# Patient Record
Sex: Female | Born: 1935
Health system: Southern US, Community
[De-identification: ages and names within clinical notes are randomized; demographics above are authoritative.]

## PROBLEM LIST (undated history)

## (undated) DIAGNOSIS — R9430 Abnormal result of cardiovascular function study, unspecified: Secondary | ICD-10-CM

## (undated) DIAGNOSIS — Z9889 Other specified postprocedural states: Secondary | ICD-10-CM

## (undated) DIAGNOSIS — R9431 Abnormal electrocardiogram [ECG] [EKG]: Secondary | ICD-10-CM

## (undated) DIAGNOSIS — K219 Gastro-esophageal reflux disease without esophagitis: Secondary | ICD-10-CM

## (undated) DIAGNOSIS — Z79899 Other long term (current) drug therapy: Secondary | ICD-10-CM

## (undated) DIAGNOSIS — I358 Other nonrheumatic aortic valve disorders: Secondary | ICD-10-CM

## (undated) DIAGNOSIS — I6529 Occlusion and stenosis of unspecified carotid artery: Secondary | ICD-10-CM

## (undated) DIAGNOSIS — R0600 Dyspnea, unspecified: Secondary | ICD-10-CM

## (undated) DIAGNOSIS — F419 Anxiety disorder, unspecified: Secondary | ICD-10-CM

## (undated) DIAGNOSIS — M1990 Unspecified osteoarthritis, unspecified site: Secondary | ICD-10-CM

## (undated) DIAGNOSIS — E785 Hyperlipidemia, unspecified: Secondary | ICD-10-CM

## (undated) DIAGNOSIS — I4891 Unspecified atrial fibrillation: Secondary | ICD-10-CM

## (undated) DIAGNOSIS — M549 Dorsalgia, unspecified: Secondary | ICD-10-CM

## (undated) DIAGNOSIS — I10 Essential (primary) hypertension: Secondary | ICD-10-CM

## (undated) DIAGNOSIS — R0602 Shortness of breath: Secondary | ICD-10-CM

## (undated) DIAGNOSIS — IMO0002 Reserved for concepts with insufficient information to code with codable children: Secondary | ICD-10-CM

## (undated) DIAGNOSIS — H539 Unspecified visual disturbance: Secondary | ICD-10-CM

## (undated) HISTORY — PX: BACK SURGERY: SHX140

## (undated) HISTORY — DX: Unspecified atrial fibrillation: I48.91

## (undated) HISTORY — DX: Gastro-esophageal reflux disease without esophagitis: K21.9

## (undated) HISTORY — DX: Hyperlipidemia, unspecified: E78.5

## (undated) HISTORY — DX: Other specified postprocedural states: Z98.890

## (undated) HISTORY — DX: Anxiety disorder, unspecified: F41.9

## (undated) HISTORY — DX: Abnormal electrocardiogram (ECG) (EKG): R94.31

## (undated) HISTORY — DX: Reserved for concepts with insufficient information to code with codable children: IMO0002

## (undated) HISTORY — DX: Occlusion and stenosis of unspecified carotid artery: I65.29

## (undated) HISTORY — DX: Dyspnea, unspecified: R06.00

## (undated) HISTORY — DX: Essential (primary) hypertension: I10

## (undated) HISTORY — DX: Unspecified visual disturbance: H53.9

## (undated) HISTORY — PX: CATARACT EXTRACTION, BILATERAL: SHX1313

## (undated) HISTORY — DX: Dorsalgia, unspecified: M54.9

## (undated) HISTORY — DX: Other nonrheumatic aortic valve disorders: I35.8

## (undated) HISTORY — PX: SKIN BIOPSY: SHX1

## (undated) HISTORY — DX: Abnormal result of cardiovascular function study, unspecified: R94.30

## (undated) HISTORY — DX: Shortness of breath: R06.02

## (undated) HISTORY — DX: Other long term (current) drug therapy: Z79.899

---

## 2000-10-06 ENCOUNTER — Encounter: Payer: Self-pay | Admitting: Neurosurgery

## 2000-10-06 ENCOUNTER — Encounter: Admission: RE | Admit: 2000-10-06 | Discharge: 2000-10-06 | Payer: Self-pay | Admitting: Neurosurgery

## 2001-06-29 ENCOUNTER — Encounter: Payer: Self-pay | Admitting: Neurosurgery

## 2001-07-01 ENCOUNTER — Encounter: Payer: Self-pay | Admitting: Neurosurgery

## 2001-07-01 ENCOUNTER — Inpatient Hospital Stay (HOSPITAL_COMMUNITY): Admission: RE | Admit: 2001-07-01 | Discharge: 2001-07-05 | Payer: Self-pay | Admitting: Neurosurgery

## 2004-08-08 ENCOUNTER — Ambulatory Visit (HOSPITAL_COMMUNITY): Admission: RE | Admit: 2004-08-08 | Discharge: 2004-08-08 | Payer: Self-pay | Admitting: General Surgery

## 2004-09-12 ENCOUNTER — Encounter: Admission: RE | Admit: 2004-09-12 | Discharge: 2004-09-12 | Payer: Self-pay | Admitting: General Surgery

## 2005-08-28 ENCOUNTER — Ambulatory Visit (HOSPITAL_COMMUNITY): Admission: RE | Admit: 2005-08-28 | Discharge: 2005-08-28 | Payer: Self-pay | Admitting: General Surgery

## 2008-01-12 ENCOUNTER — Ambulatory Visit: Payer: Self-pay | Admitting: Cardiology

## 2008-01-13 ENCOUNTER — Inpatient Hospital Stay (HOSPITAL_COMMUNITY): Admission: AD | Admit: 2008-01-13 | Discharge: 2008-01-14 | Payer: Self-pay | Admitting: Internal Medicine

## 2008-01-13 ENCOUNTER — Ambulatory Visit: Payer: Self-pay | Admitting: Cardiology

## 2008-01-14 ENCOUNTER — Encounter: Payer: Self-pay | Admitting: Cardiology

## 2008-01-14 ENCOUNTER — Ambulatory Visit: Payer: Self-pay | Admitting: Vascular Surgery

## 2008-04-22 ENCOUNTER — Inpatient Hospital Stay (HOSPITAL_COMMUNITY): Admission: RE | Admit: 2008-04-22 | Discharge: 2008-04-23 | Payer: Self-pay | Admitting: Neurosurgery

## 2008-09-06 ENCOUNTER — Encounter: Payer: Self-pay | Admitting: Cardiology

## 2008-11-07 ENCOUNTER — Encounter: Payer: Self-pay | Admitting: Cardiology

## 2008-11-07 ENCOUNTER — Ambulatory Visit: Payer: Self-pay | Admitting: Cardiology

## 2008-11-08 ENCOUNTER — Encounter: Payer: Self-pay | Admitting: Cardiology

## 2008-12-01 ENCOUNTER — Ambulatory Visit: Payer: Self-pay | Admitting: Cardiology

## 2008-12-02 ENCOUNTER — Encounter: Payer: Self-pay | Admitting: Cardiology

## 2009-01-10 ENCOUNTER — Encounter: Payer: Self-pay | Admitting: Cardiology

## 2009-02-15 ENCOUNTER — Ambulatory Visit: Payer: Self-pay | Admitting: Cardiology

## 2009-02-28 ENCOUNTER — Ambulatory Visit: Payer: Self-pay | Admitting: Cardiology

## 2009-02-28 ENCOUNTER — Encounter: Payer: Self-pay | Admitting: Cardiology

## 2009-03-06 ENCOUNTER — Telehealth: Payer: Self-pay | Admitting: Cardiology

## 2009-04-05 ENCOUNTER — Encounter (INDEPENDENT_AMBULATORY_CARE_PROVIDER_SITE_OTHER): Payer: Self-pay | Admitting: *Deleted

## 2009-04-05 ENCOUNTER — Ambulatory Visit: Payer: Self-pay | Admitting: Cardiology

## 2009-04-05 DIAGNOSIS — R0602 Shortness of breath: Secondary | ICD-10-CM | POA: Insufficient documentation

## 2009-05-05 ENCOUNTER — Telehealth (INDEPENDENT_AMBULATORY_CARE_PROVIDER_SITE_OTHER): Payer: Self-pay | Admitting: *Deleted

## 2009-05-11 ENCOUNTER — Ambulatory Visit: Payer: Self-pay | Admitting: Internal Medicine

## 2009-05-11 DIAGNOSIS — H5310 Unspecified subjective visual disturbances: Secondary | ICD-10-CM | POA: Insufficient documentation

## 2009-05-11 DIAGNOSIS — R079 Chest pain, unspecified: Secondary | ICD-10-CM | POA: Insufficient documentation

## 2009-05-15 ENCOUNTER — Encounter: Payer: Self-pay | Admitting: Internal Medicine

## 2009-05-15 ENCOUNTER — Ambulatory Visit: Payer: Self-pay | Admitting: Cardiology

## 2009-05-15 ENCOUNTER — Encounter: Payer: Self-pay | Admitting: Cardiology

## 2009-05-16 ENCOUNTER — Encounter: Payer: Self-pay | Admitting: Cardiology

## 2009-05-16 ENCOUNTER — Telehealth: Payer: Self-pay | Admitting: Internal Medicine

## 2009-05-22 ENCOUNTER — Telehealth (INDEPENDENT_AMBULATORY_CARE_PROVIDER_SITE_OTHER): Payer: Self-pay | Admitting: *Deleted

## 2009-05-23 ENCOUNTER — Telehealth: Payer: Self-pay | Admitting: Internal Medicine

## 2009-06-08 ENCOUNTER — Ambulatory Visit: Payer: Self-pay | Admitting: Cardiology

## 2009-06-09 ENCOUNTER — Telehealth (INDEPENDENT_AMBULATORY_CARE_PROVIDER_SITE_OTHER): Payer: Self-pay | Admitting: *Deleted

## 2009-06-13 ENCOUNTER — Encounter: Payer: Self-pay | Admitting: Cardiology

## 2009-06-13 DIAGNOSIS — K219 Gastro-esophageal reflux disease without esophagitis: Secondary | ICD-10-CM | POA: Insufficient documentation

## 2009-06-14 ENCOUNTER — Ambulatory Visit: Payer: Self-pay | Admitting: Cardiology

## 2009-06-22 ENCOUNTER — Encounter: Payer: Self-pay | Admitting: Cardiology

## 2009-06-28 ENCOUNTER — Encounter: Payer: Self-pay | Admitting: Cardiology

## 2009-07-25 ENCOUNTER — Encounter: Payer: Self-pay | Admitting: Internal Medicine

## 2009-07-27 ENCOUNTER — Ambulatory Visit: Payer: Self-pay | Admitting: Cardiology

## 2009-08-14 ENCOUNTER — Encounter: Payer: Self-pay | Admitting: Cardiology

## 2009-08-31 ENCOUNTER — Encounter: Payer: Self-pay | Admitting: Physician Assistant

## 2009-08-31 ENCOUNTER — Ambulatory Visit: Payer: Self-pay | Admitting: Cardiology

## 2009-09-07 ENCOUNTER — Encounter: Payer: Self-pay | Admitting: Cardiology

## 2009-09-14 ENCOUNTER — Ambulatory Visit: Payer: Self-pay | Admitting: Cardiology

## 2009-09-15 ENCOUNTER — Telehealth (INDEPENDENT_AMBULATORY_CARE_PROVIDER_SITE_OTHER): Payer: Self-pay | Admitting: *Deleted

## 2009-09-19 ENCOUNTER — Encounter: Payer: Self-pay | Admitting: Physician Assistant

## 2009-09-28 ENCOUNTER — Telehealth (INDEPENDENT_AMBULATORY_CARE_PROVIDER_SITE_OTHER): Payer: Self-pay | Admitting: *Deleted

## 2009-10-05 ENCOUNTER — Encounter: Payer: Self-pay | Admitting: Cardiology

## 2009-10-06 ENCOUNTER — Ambulatory Visit: Payer: Self-pay | Admitting: Cardiology

## 2009-10-13 ENCOUNTER — Encounter: Payer: Self-pay | Admitting: Cardiology

## 2009-11-01 ENCOUNTER — Telehealth (INDEPENDENT_AMBULATORY_CARE_PROVIDER_SITE_OTHER): Payer: Self-pay | Admitting: *Deleted

## 2009-11-13 ENCOUNTER — Ambulatory Visit: Payer: Self-pay | Admitting: Cardiology

## 2009-11-13 DIAGNOSIS — G4700 Insomnia, unspecified: Secondary | ICD-10-CM | POA: Insufficient documentation

## 2010-01-29 ENCOUNTER — Ambulatory Visit: Payer: Self-pay | Admitting: Cardiology

## 2010-01-30 ENCOUNTER — Encounter: Payer: Self-pay | Admitting: Cardiology

## 2010-03-29 ENCOUNTER — Telehealth (INDEPENDENT_AMBULATORY_CARE_PROVIDER_SITE_OTHER): Payer: Self-pay | Admitting: *Deleted

## 2010-05-02 ENCOUNTER — Encounter: Payer: Self-pay | Admitting: Cardiology

## 2010-05-03 ENCOUNTER — Ambulatory Visit: Payer: Self-pay | Admitting: Cardiology

## 2010-08-03 ENCOUNTER — Telehealth: Payer: Self-pay | Admitting: Cardiology

## 2010-09-25 NOTE — Miscellaneous (Signed)
  Clinical Lists Changes  Observations: Added new observation of PAST MED HX: CAROTID ARTERY STENOSIS (ICD-433.10)..Dopplers 2009 better than past..  /  doppler.Marland KitchenMarland Kitchen1/13/2011...less than 50% bilateral..stable HYPERLIPIDEMIA-MIXED (ICD-272.4) HYPERTENSION, UNSPECIFIED (ICD-401.9) DYSPNEA (ICD-786.05) ATRIAL FIBRILLATION (ICD-427.31)..cardioversion prior to April 05, 2009.. but reverted atrial fibrillation   / consultation Dr.Klein...decision to use Propafenone and cardiovert again..cardioversion June 08, 2009.[name] on 4 days Propafenone... 100 Joules... normal sinus rhythm... Cardizem stopped.. /  return to atrial fib.[name]   /   cardionet on cardizem...09/14/2009...rates  52-190. GERD Chest pain.... cardiac catheterization.. 2009... no significant CAD.[name] / recurrent chest pain 2010.. consider GI... aspirin stopped EF..  65%... echo.Marland KitchenMarland KitchenSeptember, 2010 Aortic valve sclerosis   no stenosis.... echo... September, 2010 Anxiety.. may play a role with some symptoms Coumadin therapy Low-voltage EKG.... no evidence amyloid by echo Visual changes.. prior planned to have ophthalmology evaluation   (05/02/2010 14:26) Added new observation of REFERRING MD: Jerral Bonito (05/02/2010 14:26) Added new observation of PRIMARY MD: Selinda Flavin, MD (05/02/2010 14:26)       Past History:  Past Medical History: CAROTID ARTERY STENOSIS (ICD-433.10)..Dopplers 2009 better than past..  /  doppler.Marland KitchenMarland Kitchen1/13/2011...less than 50% bilateral..stable HYPERLIPIDEMIA-MIXED (ICD-272.4) HYPERTENSION, UNSPECIFIED (ICD-401.9) DYSPNEA (ICD-786.05) ATRIAL FIBRILLATION (ICD-427.31)..cardioversion prior to April 05, 2009.. but reverted atrial fibrillation   / consultation Dr.Klein...decision to use Propafenone and cardiovert again..cardioversion June 08, 2009.[name] on 4 days Propafenone... 100 Joules... normal sinus rhythm... Cardizem stopped.. /  return to atrial fib.[name]   /   cardionet on cardizem...09/14/2009...rates  52-190. GERD Chest  pain.... cardiac catheterization.. 2009... no significant CAD.[name] / recurrent chest pain 2010.. consider GI... aspirin stopped EF..  65%... echo.Marland KitchenMarland KitchenSeptember, 2010 Aortic valve sclerosis   no stenosis.... echo... September, 2010 Anxiety.. may play a role with some symptoms Coumadin therapy Low-voltage EKG.... no evidence amyloid by echo Visual changes.. prior planned to have ophthalmology evaluation

## 2010-09-25 NOTE — Procedures (Signed)
Summary: 48 Hr Holter  48 Hr Holter   Imported By: Cyril Loosen, RN, BSN 09/26/2009 11:00:59  _____________________________________________________________________  External Attachment:    Type:   Image     Comment:   External Document  Appended Document: 48 Hr Holter Pt notified of results and verbalized understanding.

## 2010-09-25 NOTE — Letter (Signed)
Summary: External Correspondence/OFFICE NOTE DR.HOWARD  External Correspondence/OFFICE NOTE DR.HOWARD   Imported By: Dorise Hiss 05/19/2009 11:46:43  _____________________________________________________________________  External Attachment:    Type:   Image     Comment:   External Document

## 2010-09-25 NOTE — Miscellaneous (Signed)
  Clinical Lists Changes  Problems: Added new problem of * LOW-VOLTAGE EKG Added new problem of COUMADIN THERAPY (ICD-V58.61) Added new problem of GERD (ICD-530.81) Observations: Added new observation of PAST MED HX: CAROTID ARTERY STENOSIS (ICD-433.10)..Dopplers 2009 better than the past.. needs followup HYPERLIPIDEMIA-MIXED (ICD-272.4) HYPERTENSION, UNSPECIFIED (ICD-401.9) DYSPNEA (ICD-786.05) ATRIAL FIBRILLATION (ICD-427.31)..cardioversion prior to April 05, 2009.. but reverted atrial fibrillation   / consultation Dr.Klein...decision to use Propafenone and cardiovert again..cardioversion June 08, 2009.[name] on 4 days Propafenone... 100 Joules... normal sinus rhythm... Cardizem stopped.[name] GERD Chest pain.... cardiac catheterization.. 2009... no significant CAD.[name] / recurrent chest pain 2010.. consider GI... aspirin stopped EF.. normal Anxiety.. may play a role with some symptoms Coumadin therapy Low-voltage EKG.... no evidence amyloid by echo Visual changes.. prior planned to have ophthalmology evaluation   (06/13/2009 18:17) Added new observation of REFERRING MD: Jerral Bonito (06/13/2009 18:17) Added new observation of PRIMARY MD: Selinda Flavin (06/13/2009 18:17)       Past History:  Past Medical History: CAROTID ARTERY STENOSIS (ICD-433.10)..Dopplers 2009 better than the past.. needs followup HYPERLIPIDEMIA-MIXED (ICD-272.4) HYPERTENSION, UNSPECIFIED (ICD-401.9) DYSPNEA (ICD-786.05) ATRIAL FIBRILLATION (ICD-427.31)..cardioversion prior to April 05, 2009.. but reverted atrial fibrillation   / consultation Dr.Klein...decision to use Propafenone and cardiovert again..cardioversion June 08, 2009.[name] on 4 days Propafenone... 100 Joules... normal sinus rhythm... Cardizem stopped.[name] GERD Chest pain.... cardiac catheterization.. 2009... no significant CAD.[name] / recurrent chest pain 2010.. consider GI... aspirin stopped EF.. normal Anxiety.. may play a role with some symptoms Coumadin  therapy Low-voltage EKG.... no evidence amyloid by echo Visual changes.. prior planned to have ophthalmology evaluation

## 2010-09-25 NOTE — Assessment & Plan Note (Signed)
Summary: ROV-DISCUSS HOLTER RESULTS   Visit Type:  Follow-up Referring Provider:  Jerral Bonito Primary Provider:  Selinda Flavin, MD  CC:  atrial fibrillation.  History of Present Illness: The patient is seen for cardiology followup.  We saw her last diltiazem was restarted for rate control.  She definitely feels better with this.  She also wore an event recorder.  I have reviewed this completely.  His recorder showed that she had no significant bradycardia.  She did have some periods of increased heart rate.  It would appear that better rate control is still in order.  She does not have any chest pain.  Preventive Screening-Counseling & Management  Alcohol-Tobacco     Smoking Status: never  Current Medications (verified): 1)  Multivitamins   Tabs (Multiple Vitamin) .... Once Daily 2)  Calcium Carbonate-Vitamin D 600-400 Mg-Unit  Tabs (Calcium Carbonate-Vitamin D) .... Take 1 Tablet By Mouth Two Times A Day 3)  Prilosec Otc 20 Mg Tbec (Omeprazole Magnesium) .... Take 1 Tablet By Mouth Once A Day 4)  Digoxin 0.125 Mg Tabs (Digoxin) .... Take 2 Tabs Daily 5)  Lorazepam 2 Mg Tabs (Lorazepam) .... Take 1 Tab By Mouth At Bedtime 6)  Furosemide 20 Mg Tabs (Furosemide) .... Once Daily 7)  Warfarin Sodium 5 Mg Tabs (Warfarin Sodium) .... Take As Directed By Dr. Dimas Aguas 8)  Simvastatin 20 Mg Tabs (Simvastatin) .... Take 1 Tablet By Mouth Once A Day 9)  Preservision/lutein  Caps (Multiple Vitamins-Minerals) .... Take 1 Tablet By Mouth Once A Day 10)  Diltiazem Hcl Er Beads 180 Mg Xr24h-Cap (Diltiazem Hcl Er Beads) .... Take 1 Tablet By Mouth Once A Day  Allergies (verified): No Known Drug Allergies  Comments:  Nurse/Medical Assistant: The patient's medications and allergies were reviewed with the patient and were updated in the Medication and Allergy Lists. Bottles reviewed.  Past History:  Past Medical History: Last updated: 10/05/2009 CAROTID ARTERY STENOSIS (ICD-433.10)..Dopplers 2009  better than past..  /  doppler.Marland KitchenMarland Kitchen1/13/2011...less than 50% bilateral..stable HYPERLIPIDEMIA-MIXED (ICD-272.4) HYPERTENSION, UNSPECIFIED (ICD-401.9) DYSPNEA (ICD-786.05) ATRIAL FIBRILLATION (ICD-427.31)..cardioversion prior to April 05, 2009.. but reverted atrial fibrillation   / consultation Dr.Klein...decision to use Propafenone and cardiovert again..cardioversion June 08, 2009.[name] on 4 days Propafenone... 100 Joules... normal sinus rhythm... Cardizem stopped.. /  return to atrial fib.[name]   /   cardionet on cardizem...09/14/2009...rates  52-190. GERD Chest pain.... cardiac catheterization.. 2009... no significant CAD.[name] / recurrent chest pain 2010.. consider GI... aspirin stopped EF.. normal Anxiety.. may play a role with some symptoms Coumadin therapy Low-voltage EKG.... no evidence amyloid by echo Visual changes.. prior planned to have ophthalmology evaluation    Review of Systems       Patient denies fever, chills, headache, sweats, rash, change in vision, change in hearing, chest pain, cough, nausea vomiting, urinary symptoms.All of the systems are reviewed and are negative.  Vital Signs:  Patient profile:   77 year old female Height:      60 inches Weight:      107 pounds Pulse rate:   85 / minute BP sitting:   133 / 82  (left arm) Cuff size:   regular  Vitals Entered By: Carlye Grippe (October 06, 2009 1:22 PM) CC: atrial fibrillation   Physical Exam  General:  Patient is stable today. Head:  head is atraumatic. Eyes:  no xanthelasma. Neck:  no jugular venous sedation. Chest Wall:  no chest wall tenderness. Lungs:  lungs are clear respiratory effort is nonlabored. Heart:  cardiac exam reveals  S1-S2.  Rhythm is irregularly irregular. Abdomen:  abdomen is soft. Msk:  no musculoskeletal deformities. Extremities:  no peripheral edema. Skin:  no skin rashes. Psych:  patient is oriented to person time and place.  Affect is normal.   Impression &  Recommendations:  Problem # 1:  CAROTID ARTERY DISEASE (ICD-433.10) Patient had followup carotid Dopplers.  I have reviewed the report.  She continues to have mild disease.  No further workup is needed at this time.  I discussed this result with her.  Problem # 2:  COUMADIN THERAPY (ICD-V58.61) Coumadin is to be continued.  Problem # 3:  CHEST PAIN (ICD-786.50)  Her updated medication list for this problem includes:    Warfarin Sodium 5 Mg Tabs (Warfarin sodium) .[name]... Take as directed by dr. Dimas Aguas    Diltiazem Hcl Er Beads 180 Mg Xr24h-cap (Diltiazem hcl er beads) .[name]... Take 1 tablet by mouth once a day    Metoprolol Succinate 25 Mg Xr24h-tab (Metoprolol succinate) .[name]... Take one tablet by mouth daily The patient has not had a recurrent any recurrent chest pain.  She's stable.  No further workup.  Problem # 4:  HYPERTENSION, UNSPECIFIED (ICD-401.9) Blood pressure stable.  However as we increase her meds for her atrial fib, she should be able to tolerate more medicines.  Problem # 5:  ATRIAL FIBRILLATION (ICD-427.31)  Her updated medication list for this problem includes:    Digoxin 0.125 Mg Tabs (Digoxin) .[name]... Take 2 tabs daily    Warfarin Sodium 5 Mg Tabs (Warfarin sodium) .[name]... Take as directed by dr. Dimas Aguas    Metoprolol Succinate 25 Mg Xr24h-tab (Metoprolol succinate) .[name]... Take one tablet by mouth daily See discussion above.  Atrial fib rate is still higher than I would like to see.  She said she felt poorly in the past when we push her diltiazem to a higher dose.  I've chosen therefore to start metoprolol succinate 25 mg daily.  I reviewed carefully the Holter data.  The patient had no pauses.  She did have periods of increased heart rate.  We will try to fine tune her medications for better rate control if possible.  I also discussed with the patient and her daughter that other medicines could be tried and she could be considered for atrial fibrillation.  However it is my feeling  that he should not be recommended at this time.  Other Orders: EKG w/ Interpretation (93000)  Patient Instructions: 1)  Start Metoprolol ER 25mg  by mouth once daily. 2)  Your physician recommends that you schedule a follow-up appointment in: 3-4 weeks. Prescriptions: METOPROLOL SUCCINATE 25 MG XR24H-TAB (METOPROLOL SUCCINATE) Take one tablet by mouth daily  #25 x 6   Entered by:   Cyril Loosen, RN, BSN   Authorized by:   Talitha Givens, MD, [facility]   Signed by:   Cyril Loosen, RN, BSN on 10/06/2009   Method used:   Electronically to        [facility]* (retail)       [address]       [place], [place]  [address]       Ph: [phone]       Fax: [phone]   RxID:   [id number]   Handout requested.

## 2010-09-25 NOTE — Letter (Signed)
Summary: Appointment- Rescheduled  [facility] [address]   [place], [place] [address]   Phone: [phone]  Fax: [phone]     October 13, 2009 MRN: [id number]      [facility] [address] [place], [place]  [address]     Dear Ms. Vellucci,   Due to a change in our office schedule, your appointment on   March 9,2011 at  2:30 pm must be changed.    Your new appointment will be March 21ST at 2:15pm.  We look forward to participating in your health care needs.   Please contact us at the number listed above at your earliest convenience to reschedule this appointment if needed.      Sincerely,  Glass blower/designer

## 2010-09-25 NOTE — Progress Notes (Signed)
Summary: **SK** when should patient start taking meds?  Phone Note Call from Patient Call back at [phone]   Caller: Daughter    [facility] Reason for Call: Talk to Nurse Summary of Call: When should patient begin taking Propafenone? Initial call taken by: Burnard Leigh,  May 23, 2009 9:35 AM  Follow-up for Phone Call        S/W Patsy and advised her to have her mother start the Propafenone 4 days prior to DCCV. Follow-up by: Duncan Dull, RN, BSN,  May 23, 2009 10:09 AM

## 2010-09-25 NOTE — Progress Notes (Signed)
Summary: out of medication  Phone Note Call from Patient Call back at [facility] Phone [phone]   Caller: Patient Reason for Call: Talk to Nurse Details for Reason: refill request Summary of Call: Patient is out of metroplol  she did not realize that she was.  Would greatly appreciate if we can fill for her today Initial call taken by: Claudette Laws,  March 29, 2010 9:19 AM  Follow-up for Phone Call        Refill sent today Follow-up by: Cyril Loosen, RN, BSN,  March 29, 2010 3:45 PM

## 2010-09-25 NOTE — Assessment & Plan Note (Signed)
Summary: 6 week f/u/LA   Visit Type:  Follow-up Primary Provider:  Selinda Flavin, MD   History of Present Illness: 77 year old female returns for scheduled followup. She has history of persistent atrial fibrillation, having failed DC cardioversion on 2 separate occasions. Since her last visit here in early December, she continues to have tachycardia palpitations on a nightly basis. There is some associated weakness, chest pain, and shortness of breath. She denies any frank syncope. She also is plagued by persistent, daily left upper chest discomfort, with no clear precipitant, as well as persistent left upper quadrant pain. The latter has been evaluated by Dr. Dimas Aguas.  Preventive Screening-Counseling & Management  Alcohol-Tobacco     Smoking Status: never  Current Medications (verified): 1)  Multivitamins   Tabs (Multiple Vitamin) .... Once Daily 2)  Calcium Carbonate-Vitamin D 600-400 Mg-Unit  Tabs (Calcium Carbonate-Vitamin D) .... Take 1 Tablet By Mouth Two Times A Day 3)  Prilosec Otc 20 Mg Tbec (Omeprazole Magnesium) .... Take 1 Tablet By Mouth Once A Day 4)  Digoxin 0.125 Mg Tabs (Digoxin) .... Take 2 Tabs Daily 5)  Lorazepam 2 Mg Tabs (Lorazepam) .... Take 1 Tab By Mouth At Bedtime 6)  Furosemide 20 Mg Tabs (Furosemide) .... Once Daily 7)  Warfarin Sodium 5 Mg Tabs (Warfarin Sodium) .... Take As Directed By Dr. Dimas Aguas 8)  Simvastatin 20 Mg Tabs (Simvastatin) .... Take 1 Tablet By Mouth Once A Day 9)  Preservision/lutein  Caps (Multiple Vitamins-Minerals) .... Take 1 Tablet By Mouth Once A Day  Allergies: No Known Drug Allergies  Comments:  Nurse/Medical Assistant: The patient's medications were reviewed with the patient and were updated in the Medication List. Pt brought her medications.  Past History:  Past Medical History: Last updated: 06/13/2009 CAROTID ARTERY STENOSIS (ICD-433.10)..Dopplers 2009 better than the past.. needs followup HYPERLIPIDEMIA-MIXED  (ICD-272.4) HYPERTENSION, UNSPECIFIED (ICD-401.9) DYSPNEA (ICD-786.05) ATRIAL FIBRILLATION (ICD-427.31)..cardioversion prior to April 05, 2009.. but reverted atrial fibrillation   / consultation Dr.Klein...decision to use Propafenone and cardiovert again..cardioversion June 08, 2009.[name] on 4 days Propafenone... 100 Joules... normal sinus rhythm... Cardizem stopped.[name] GERD Chest pain.... cardiac catheterization.. 2009... no significant CAD.[name] / recurrent chest pain 2010.. consider GI... aspirin stopped EF.. normal Anxiety.. may play a role with some symptoms Coumadin therapy Low-voltage EKG.... no evidence amyloid by echo Visual changes.. prior planned to have ophthalmology evaluation    Review of Systems       No fevers, chills, hemoptysis, dysphagia, melena, hematocheezia, hematuria, rash, claudication, orthopnea, pnd, pedal edema. All other systems negative.   Vital Signs:  Patient profile:   77 year old female Height:      60 inches Weight:      107.50 pounds BMI:     21.07 Pulse rate:   73 / minute BP sitting:   135 / 83  (left arm) Cuff size:   regular  Vitals Entered By: Cyril Loosen, RN, BSN (August 31, 2009 2:16 PM) Comments Follow up appt. C/o pain around (L) breast at times.   Physical Exam  Additional Exam:  GENERAL: HU female sitting upright in no distress. HEENT: [place], AT. PERRLA, EOMI. NECK: Palpable carotid pulses, no bruits; no JVD; no thyromegaly. LUNGS: CTA bilaterally. CARDIAC: irregularly irregular (S1,S2). No significant murmurs. No rubs, gallops. ABDOMEN: Soft, nontender. Intact BS. EXTREMETIES: no significant edema. MUSCULOSKELETAL: No obvious deformity. SKIN: warm, dry. no obvious rashes. NEURO: Alert and oriented. No focal deficit.    EKG  Procedure date:  08/31/2009  Findings:  atrial fibrillation at 105 bpm; normal axis; poor R wave progression; question prior septal infarct; no acute changes.  Impression &  Recommendations:  Problem # 1:  ATRIAL FIBRILLATION (ICD-427.31)  patient remains in persistent atrial fibrillation with uncontrolled ventricular response.  she has failed DC cardioversion on 2 previous occasions. She has also been previously treated with Rythmol and diltiazem. She is on chronic Coumadin. She continues to experience tachycardia palpitations on a nightly basis, with associated symptoms. following review with Dr. Myrtis Ser, recommendation is to place her back on diltiazem at the previous dose of 180 mg daily. after 2 weeks, we will place her on a 48 hour Holter monitor to rule out bradytachycardia. She will return in one month for early clinic followup, with myself and Dr. Myrtis Ser.  Problem # 2:  CHEST PAIN (ICD-786.50)  recurrent atypical chest pain with no clear precipitant. She has history of nonobstructive CAD by cardiac catheterization in 2009. We'll provide reassurance, and we doubt that this is related to underlying ischemia.  Problem # 3:  HYPERTENSION, UNSPECIFIED (ICD-401.9) Assessment: Comment Only  Problem # 4:  CAROTID ARTERY STENOSIS (ICD-433.10)  Will need to schedule surveillance carotid Dopplers.  Problem # 5:  HYPERLIPIDEMIA-MIXED (ICD-272.4)  continued aggressive lipid management recommended, with target LDL of 70 or less, if feasible. We'll defer to Dr. Dimas Aguas for continued monitoring and management.  Other Orders: EKG w/ Interpretation (93000) Carotid Duplex (Carotid Duplex)  Patient Instructions: 1)  Your physician recommends that you schedule a follow-up appointment in: MARCH 9TH 2011 @2 :30PM. 2)  You need to come on JANUARY 20TH 2011 @9 :00AM TO HAVE YOUR HOLTER MONITOR PUT ON BY THE NURSE.  3)  Your physician has recommended you make the following change in your medication: START DILTIAZEM 180MG . Your prescription has been sent to your pharmacy. 4)  Your physician has requested that you have a carotid duplex. This test is an ultrasound of the carotid  arteries in your neck. It looks at blood flow through these arteries that supply the brain with blood. Allow one hour for this exam. There are no restrictions or special instructions. Prescriptions: DILTIAZEM HCL ER BEADS 180 MG XR24H-CAP (DILTIAZEM HCL ER BEADS) Take 1 tablet by mouth once a day  #30 x 6   Entered by:   Carlye Grippe   Authorized by:   Nelida Meuse, PA-C   Signed by:   Carlye Grippe on 08/31/2009   Method used:   Electronically to        [facility] (retail)       [address]       [place], [place]  [address]       Ph: [phone]       Fax: [phone]   RxID:   [id number]   Appended Document: [facility]     Allergies: No Known Drug Allergies   Other Orders: Holter Monitor (Holter Monitor)

## 2010-09-25 NOTE — Assessment & Plan Note (Signed)
Summary: 3 MONTH FU -VS   Visit Type:  Follow-up Primary Provider:  Selinda Flavin, MD  CC:  atrial fibrillation.  History of Present Illness: The patient is seen for followup of atrial fibrillation.  I saw her last November 13, 2009.  Since that time she is sleeping better.  I did not change her medicines at the time of last visit.  The overall data and Holter data suggested that her rate was controlled.  She is any chest pain.  She does have some sensations of feeling off balance.  She has not fallen.  She also mentions some tightness in her abdomen at times when she lies down at night.  I am not convinced that this is orthopnea.  Preventive Screening-Counseling & Management  Alcohol-Tobacco     Smoking Status: never  Current Medications (verified): 1)  Multivitamins   Tabs (Multiple Vitamin) .... Once Daily 2)  Calcium Carbonate-Vitamin D 600-400 Mg-Unit  Tabs (Calcium Carbonate-Vitamin D) .... Take 1 Tablet By Mouth Two Times A Day 3)  Prilosec Otc 20 Mg Tbec (Omeprazole Magnesium) .... Take 1 Tablet By Mouth Once A Day As Needed 4)  Digoxin 0.125 Mg Tabs (Digoxin) .... Take 2 Tabs Daily 5)  Furosemide 20 Mg Tabs (Furosemide) .... Once Daily 6)  Warfarin Sodium 2 Mg Tabs (Warfarin Sodium) .... Use As Directed/checked At [facility] 7)  Simvastatin 20 Mg Tabs (Simvastatin) .... Take 1 Tablet By Mouth Once A Day 8)  Preservision/lutein  Caps (Multiple Vitamins-Minerals) .... Take 1 Tablet By Mouth Two Times A Day 9)  Diltiazem Hcl Er Beads 180 Mg Xr24h-Cap (Diltiazem Hcl Er Beads) .... Take 1 Tablet By Mouth Once A Day 10)  Metoprolol Succinate 25 Mg Xr24h-Tab (Metoprolol Succinate) .... Take One Tablet By Mouth Daily 11)  Temazepam 15 Mg Caps (Temazepam) .... Take 1 Tablet By Mouth Once A Day At Bedtime  Allergies (verified): No Known Drug Allergies  Comments:  Nurse/Medical Assistant: The patient's medication bottles and allergies were reviewed with the patient and were updated in  the Medication and Allergy Lists.  Past History:  Past Medical History: CAROTID ARTERY STENOSIS (ICD-433.10)..Dopplers 2009 better than past..  /  doppler.Marland KitchenMarland Kitchen1/13/2011...less than 50% bilateral..stable HYPERLIPIDEMIA-MIXED (ICD-272.4) HYPERTENSION, UNSPECIFIED (ICD-401.9) DYSPNEA (ICD-786.05) ATRIAL FIBRILLATION (ICD-427.31)..cardioversion prior to April 05, 2009.. but reverted atrial fibrillation   / consultation Dr.Klein...decision to use Propafenone and cardiovert again..cardioversion June 08, 2009.[name] on 4 days Propafenone... 100 Joules... normal sinus rhythm... Cardizem stopped.. /  return to atrial fib.[name]   /   cardionet on cardizem...09/14/2009...rates  52-190. GERD Chest pain.... cardiac catheterization.. 2009... no significant CAD.[name] / recurrent chest pain 2010.. consider GI... aspirin stopped EF.. normal Anxiety.. may play a role with some symptoms Coumadin therapy Low-voltage EKG.... no evidence amyloid by echo Visual changes.. prior planned to have ophthalmology evaluation    Review of Systems       Patient denies fevers, chills, headache, sweats, rash, change in vision, change in hearing, chest pain, cough, nausea vomiting, urinary symptoms.  All other systems are reviewed and are negative.  Vital Signs:  Patient profile:   77 year old female Height:      60 inches Weight:      108 pounds Pulse rate:   83 / minute BP sitting:   127 / 72  (left arm) Cuff size:   regular  Vitals Entered By: Carlye Grippe (January 29, 2010 2:50 PM)  Physical Exam  General:  patient is stable. Head:  head is atraumatic.  Eyes:  eyes reveal no xanthelasma. Neck:  no jugular venous distention. Chest Wall:  no chest wall tenderness. Lungs:  lungs are clear.  Respiratory effort is nonlabored. Heart:  cardiac exam reveals an S1-S2.  No clicks or significant murmurs.  The rhythm is irregularly irregular. Abdomen:  abdomen is soft. Msk:  no musculoskeletal deformities. Extremities:  no  peripheral edema. Skin:  no skin rashes. Psych:  patient is oriented to person time and place.  Affect is normal.   Impression & Recommendations:  Problem # 1:  INSOMNIA (ICD-780.52) Fortunately insomnia is improved.  No change in therapy.  Problem # 2:  CAROTID ARTERY DISEASE (ICD-433.10)  Her updated medication list for this problem includes:    Warfarin Sodium 2 Mg Tabs (Warfarin sodium) ..... Use as [facility]  Orders: EKG w/ Interpretation (93000) Carotid Dopplers done earlier this year showed only mild disease.  She does not need any further treatment.  Problem # 3:  COUMADIN THERAPY (ICD-V58.61) Patient continues on Coumadin with her atrial fibrillation.  We are helping to arrange her INR is to be checked appropriately.  Problem # 4:  * LOW-VOLTAGE EKG EKG is done today and reviewed by me.  Once again she has low voltage.  She has atrial fibrillation controlled rate.  No change in therapy.  Problem # 5:  CHEST PAIN (ICD-786.50)  Her updated medication list for this problem includes:    Warfarin Sodium 2 Mg Tabs (Warfarin sodium) ..... Use as directed/checked at [facility]    Diltiazem Hcl Er Beads 180 Mg Xr24h-cap (Diltiazem hcl er beads) .[name]... Take 1 tablet by mouth once a day    Metoprolol Succinate 25 Mg Xr24h-tab (Metoprolol succinate) .[name]... Take one tablet by mouth daily She's not been having any chest pain or no change in therapy.  Problem # 6:  HYPERTENSION, UNSPECIFIED (ICD-401.9)  Her updated medication list for this problem includes:    Furosemide 20 Mg Tabs (Furosemide) ..... Once daily    Diltiazem Hcl Er Beads 180 Mg Xr24h-cap (Diltiazem hcl er beads) .[name]... Take 1 tablet by mouth once a day    Metoprolol Succinate 25 Mg Xr24h-tab (Metoprolol succinate) .[name]... Take one tablet by mouth daily Blood pressure is well controlled.  No change in therapy.  Problem # 7:  ATRIAL FIBRILLATION (ICD-427.31)  Her updated medication list for  this problem includes:    Digoxin 0.125 Mg Tabs (Digoxin) .[name]... Take 2 tabs daily    Warfarin Sodium 2 Mg Tabs (Warfarin sodium) ..... Use as directed/checked [facility]    Metoprolol Succinate 25 Mg Xr24h-tab (Metoprolol succinate) .[name]... Take one tablet by mouth daily  Orders: EKG w/ Interpretation (93000) T-Digoxin (32951-88416) Atrial fib persists.  I believe her rate is well controlled.  We need to check a digoxin level..  Problem # 8:  * SENSATION OF FEELING OFF BALANCE. The etiology is not clear to me.  I feel that this is noncardiac in origin.  Patient Instructions: 1)  Your physician wants you to follow-up in: 3 months. You will receive a reminder letter in the mail one-two months in advance. If you don't receive a letter, please call our office to schedule the follow-up appointment. 2)  Your physician recommends that you go to the [facility] for lab work. If the results of your test are normal or stable, you will receive a letter. If they are abnormal, the nurse will contact you by phone.  3)  Your physician recommends that  you continue on your current medications as directed. Please refer to the Current Medication list given to you today.

## 2010-09-25 NOTE — Letter (Signed)
Summary: Appointment -missed  [facility] [address]   [place], [place] [address]   Phone: [phone]  Fax: [phone]     August 14, 2009 MRN: [id number]      [facility] [address] [place], [place]  [address]      Dear Ms. Monteith,  Our records indicate you missed your appointment on August 14, 2009                        with Dr.   Myrtis Ser.   It is very important that we reach you to reschedule this appointment. We look forward to participating in your health care needs.   Please contact us at the number listed above at your earliest convenience to reschedule this appointment.   Sincerely,    Glass blower/designer

## 2010-09-25 NOTE — Miscellaneous (Signed)
Summary: med list  Clinical Lists Changes  Medications: Added new medication of MULTIVITAMINS   TABS (MULTIPLE VITAMIN) once daily Added new medication of CALCIUM CARBONATE-VITAMIN D 600-400 MG-UNIT  TABS (CALCIUM CARBONATE-VITAMIN D) 2 once daily Added new medication of ASPIRIN 81 MG TBEC (ASPIRIN) Take one tablet by mouth daily Added new medication of RANITIDINE HCL 150 MG CAPS (RANITIDINE HCL) two times a day Added new medication of DIGOXIN 0.125 MG TABS (DIGOXIN) Take a half  tablet by mouth daily Added new medication of LORAZEPAM 2 MG TABS (LORAZEPAM) at bedtime Added new medication of FUROSEMIDE 20 MG TABS (FUROSEMIDE) once daily Added new medication of WARFARIN SODIUM 5 MG TABS (WARFARIN SODIUM) Use as directed by [facility] Added new medication of SIMVASTATIN 20 MG TABS (SIMVASTATIN) Take one tablet by mouth daily at bedtime Added new medication of DILTIAZEM HCL ER BEADS 180 MG XR24H-CAP (DILTIAZEM HCL ER BEADS) Take one capsule by mouth daily

## 2010-09-25 NOTE — Progress Notes (Signed)
Summary: Request for earlier appt  Phone Note Call from Patient Call back at [phone]   Caller: Daughter Call For: nurse Summary of Call: Daughter Lura Em called concerned that patient needed an earlier appt before March 9th. Daughter states that her mom had a phone call from Dr. Myrtis Ser office stating that he needed to go over these results of her holter monitor and felt that she should be soon before then.  Initial call taken by: Carlye Grippe,  September 28, 2009 4:09 PM  Follow-up for Phone Call        spoke with MD and he okayed patient to be put on Feb 11th schedule. Routed to registar for scheduling. Follow-up by: Carlye Grippe,  September 28, 2009 4:25 PM     Appended Document: Request for earlier appt FEB 11TH @ 1:30  Appended Document: Request for earlier appt Patient's daughter informed of the above.

## 2010-09-25 NOTE — Assessment & Plan Note (Signed)
Summary: per dr. Myrtis [name] -fu daughter requesting sooner than 12/20   Visit Type:  Follow-up Primary [name] [name]:  [name] [name]  CC:  atrial fibrillation.  History of Present Illness: The patient is seen for followup of atrial fibrillation.  I saw her last June 14, 2009.  At that time I made plans to contact her after speaking again with [name] [name].  He and I both agreed that we should stop the Rythmol.  This was done and I chose not to put her back on diltiazem.  She feels great.  As a matter of fact she has been working with the leads in her yard for 3-4 hour periods.  This clearly answers the fact that she is doing well.  She has not had any chest pain.  It is my understanding that she had an MRI questioning a prior small CNS event.  I explained to her that she is now on Coumadin for this purpose.    Preventive Screening-Counseling & Management  Alcohol-Tobacco     Smoking Status: never  Current Medications (verified): 1)  Multivitamins   Tabs (Multiple Vitamin) .... Once Daily 2)  Calcium Carbonate-Vitamin D 600-400 Mg-Unit  Tabs (Calcium Carbonate-Vitamin D) .... Take 1 Tablet By Mouth Two Times A Day 3)  Prilosec Otc 20 Mg Tbec (Omeprazole Magnesium) .... Take 1 Tablet By Mouth Once A Day 4)  Digoxin 0.125 Mg Tabs (Digoxin) .... Take 2 Tabs Daily 5)  Lorazepam 2 Mg Tabs (Lorazepam) .... Take 1 Tab By Mouth At Bedtime 6)  Furosemide 20 Mg Tabs (Furosemide) .... Once Daily 7)  Warfarin Sodium 5 Mg Tabs (Warfarin Sodium) .... Take As Directed By Dr. Dimas [name] 8)  Simvastatin 20 Mg Tabs (Simvastatin) .... Take 1 Tablet By Mouth Once A Day 9)  Preservision/lutein  Caps (Multiple Vitamins-Minerals) .... Take 1 Tablet By Mouth Once A Day  Allergies: No Known Drug Allergies  Past History:  Past Medical History: Last updated: 06/13/2009 CAROTID ARTERY STENOSIS (ICD-433.10)..Dopplers 2009 better than the past.. needs followup HYPERLIPIDEMIA-MIXED (ICD-272.4) HYPERTENSION, UNSPECIFIED  (ICD-401.9) DYSPNEA (ICD-786.05) ATRIAL FIBRILLATION (ICD-427.31)..cardioversion prior to April 05, 2009.. but reverted atrial fibrillation   / consultation [name] [name]...decision to use Propafenone and cardiovert again..cardioversion June 08, 2009.[name] on 4 days Propafenone... 100 Joules... normal sinus rhythm... Cardizem stopped.[name] GERD Chest pain.... cardiac catheterization.. 2009... no significant CAD.[name] / recurrent chest pain 2010.. consider GI... aspirin stopped EF.. normal Anxiety.. may play a role with some symptoms Coumadin therapy Low-voltage EKG.... no evidence amyloid by echo Visual changes.. prior planned to have ophthalmology evaluation    Review of Systems       Patient denies fever, chills, sweats, headache, rash, change in vision, change in hearing, chest pain, shortness of breath, cough, nausea vomiting, urinary symptoms, musculoskeletal problems.  All other systems are reviewed and are negative.  Vital Signs:  Patient profile:   77 year old female Height:      60 inches Weight:      108.75 pounds Pulse rate:   89 / minute BP sitting:   121 / 68  (left arm) Cuff size:   regular  Vitals Entered By: [name] Brunette, LPN (July 27, 2009 12:32 PM) CC: atrial fibrillation Is Patient Diabetic? No Comments follow up visit.   Heartrate ranging from 89-120's.     Physical Exam  General:  she looks great today. Head:  head is atraumatic. Eyes:  no xanthelasma. Neck:  no jugular venous distention. Chest Wall:  no chest wall tenderness. Lungs:  lungs are  clear respiratory effort is nonlabored. Heart:  cardiac exam reveals S1-S2.  The rhythm is irregularly irregular Abdomen:  abdomen is soft. Msk:  no musculoskeletal deformities. Extremities:  no peripheral edema. Skin:  no skin rashes. Psych:  patient is oriented to person time and place.  Affect is normal.   Impression & Recommendations:  Problem # 1:  COUMADIN THERAPY (ICD-V58.61) Patient continues Coumadin and is  doing well with.  Problem # 2:  * LOW-VOLTAGE EKG EKG is done today.  She continues to have low voltage and there is no change.  No further workup.  Problem # 3:  CHEST PAIN (ICD-786.50)  Her updated medication list for this problem includes:    Warfarin Sodium 5 Mg Tabs (Warfarin sodium) .[name]... Take as directed by dr. Dimas [name] The patient has not been having any chest pain.  No further workup at this time.  Problem # 4:  CAROTID ARTERY STENOSIS (ICD-433.10) I. see the patient back for her next visit I will arrange for followup carotid Dopplers.  Her updated medication list for this problem includes:    Warfarin Sodium 5 Mg Tabs (Warfarin sodium) .[name]... Take as directed by dr. Dimas [name]  Problem # 5:  HYPERTENSION, UNSPECIFIED (ICD-401.9)  Her updated medication list for this problem includes:    Furosemide 20 Mg Tabs (Furosemide) ..... Once daily Blood pressure is well controlled at this time.  No change in therapy.  Problem # 6:  ATRIAL FIBRILLATION (ICD-427.31)  The following medications were removed from the medication list:    Propafenone Hcl 225 Mg Tabs (Propafenone hcl) .[name]... Take one tablet by mouth two times a day Her updated medication list for this problem includes:    Digoxin 0.125 Mg Tabs (Digoxin) .[name]... Take 2 tabs daily    Warfarin Sodium 5 Mg Tabs (Warfarin sodium) .[name]... Take as directed by dr. Dimas [name]  EKG is done today.  She still has atrial fibrillation.  She actually has significant variation in the rate during a short rhythm strip.  At times her rate is up as high as 110.  However at other times her rate is on the slower side.  The patient had been on diltiazem before we tried Rythmol.  When I stop Rythmol I did not restart diltiazem.  She feels great.  She exercised working in her yard for 4 hours yesterday.  It seems unlikely to me that she is having significant limiting tachycardia.  On this basis I am inclined to leave her off diltiazem at this time despite fast her  heart rate if we see some of the time on her EKG.  EKG was reviewed by me.  I'll see her in followup in 6 weeks.  Orders: EKG w/ Interpretation (93000)  Patient Instructions: 1)  Your physician recommends that you schedule a follow-up appointment in: January 6th 2011 @2 :15pm with Dr. Myrtis [name]. 2)  Your physician recommends that you continue on your current medications as directed. Please refer to the Current Medication list given to you today.

## 2010-09-25 NOTE — Assessment & Plan Note (Signed)
Summary: 3-4 WK F/U PER 2/11 OV-JM   Visit Type:  Follow-up Primary Provider:  Selinda Flavin, MD  CC:  atrial fibrillation.  History of Present Illness: The patient is seen for followup of atrial fibrillation.  I saw her last February, 2011.  At that time we added a small dose of metoprolol.  After reviewing with the patient I think that this has probably helped her.  She is not sleeping well at this time and she has some mild headaches.  These problems appear to make her feel worse then the issue of palpitations.  Current Medications (verified): 1)  Multivitamins   Tabs (Multiple Vitamin) .... Once Daily 2)  Calcium Carbonate-Vitamin D 600-400 Mg-Unit  Tabs (Calcium Carbonate-Vitamin D) .... Take 1 Tablet By Mouth Two Times A Day 3)  Prilosec Otc 20 Mg Tbec (Omeprazole Magnesium) .... Take 1 Tablet By Mouth Once A Day 4)  Digoxin 0.125 Mg Tabs (Digoxin) .... Take 2 Tabs Daily 5)  Furosemide 20 Mg Tabs (Furosemide) .... Once Daily 6)  Warfarin Sodium 5 Mg Tabs (Warfarin Sodium) .... Take As Directed By Dr. Dimas Aguas 7)  Simvastatin 20 Mg Tabs (Simvastatin) .... Take 1 Tablet By Mouth Once A Day 8)  Preservision/lutein  Caps (Multiple Vitamins-Minerals) .... Take 1 Tablet By Mouth Two Times A Day 9)  Diltiazem Hcl Er Beads 180 Mg Xr24h-Cap (Diltiazem Hcl Er Beads) .... Take 1 Tablet By Mouth Once A Day 10)  Metoprolol Succinate 25 Mg Xr24h-Tab (Metoprolol Succinate) .... Take One Tablet By Mouth Daily  Allergies (verified): No Known Drug Allergies  Past History:  Past Medical History: Last updated: 10/05/2009 CAROTID ARTERY STENOSIS (ICD-433.10)..Dopplers 2009 better than past..  /  doppler.Marland KitchenMarland Kitchen1/13/2011...less than 50% bilateral..stable HYPERLIPIDEMIA-MIXED (ICD-272.4) HYPERTENSION, UNSPECIFIED (ICD-401.9) DYSPNEA (ICD-786.05) ATRIAL FIBRILLATION (ICD-427.31)..cardioversion prior to April 05, 2009.. but reverted atrial fibrillation   / consultation Dr.Klein...decision to use Propafenone  and cardiovert again..cardioversion June 08, 2009.[name] on 4 days Propafenone... 100 Joules... normal sinus rhythm... Cardizem stopped.. /  return to atrial fib.[name]   /   cardionet on cardizem...09/14/2009...rates  52-190. GERD Chest pain.... cardiac catheterization.. 2009... no significant CAD.[name] / recurrent chest pain 2010.. consider GI... aspirin stopped EF.. normal Anxiety.. may play a role with some symptoms Coumadin therapy Low-voltage EKG.... no evidence amyloid by echo Visual changes.. prior planned to have ophthalmology evaluation    Review of Systems       Patient denies fever, chills, headache, sweats, rash, change in vision, change in hearing, chest pain, shortness of breath, cough, nausea or vomiting, urinary symptoms.  All other systems are reviewed and are negative.  Vital Signs:  Patient profile:   77 year old female Height:      60 inches Weight:      106 pounds BMI:     20.78 Pulse rate:   60 / minute BP sitting:   112 / 74  (left arm) Cuff size:   regular  Vitals Entered By: Hardin Negus, RMA (November 13, 2009 2:16 PM)  Physical Exam  General:  patient is stable today. Eyes:  no xanthelasma. Neck:  no jugular venous distention. Lungs:  lungs are clear.  Respiratory effort is nonlabored. Heart:  cardiac exam reveals S1-S2.  No clicks or significant murmurs. Abdomen:  abdomen is soft. Extremities:  no peripheral edema. Psych:  patient is oriented to person time and place.  Affect is normal.   Impression & Recommendations:  Problem # 1:  INSOMNIA (ICD-780.52) At this point I think this may be  her major problem.  She was given a prescription for Ambien, but it appears that this kept her up at night.  She will check with Dr.Howard about what might be tried next.  There is an anxiety component to her problem.  Problem # 2:  CAROTID ARTERY DISEASE (ICD-433.10)  Her updated medication list for this problem includes:    Warfarin Sodium 5 Mg Tabs (Warfarin sodium)  .[name]... Take as directed by dr. Dimas Aguas Carotid disease is mild.  This will be followed.  Problem # 3:  CHEST PAIN (ICD-786.50)  Her updated medication list for this problem includes:    Warfarin Sodium 5 Mg Tabs (Warfarin sodium) .[name]... Take as directed by dr. Dimas Aguas    Diltiazem Hcl Er Beads 180 Mg Xr24h-cap (Diltiazem hcl er beads) .[name]... Take 1 tablet by mouth once a day    Metoprolol Succinate 25 Mg Xr24h-tab (Metoprolol succinate) .[name]... Take one tablet by mouth daily She is not having any significant chest pain.  No further workup.  Problem # 4:  ATRIAL FIBRILLATION (ICD-427.31)  Her updated medication list for this problem includes:    Digoxin 0.125 Mg Tabs (Digoxin) .[name]... Take 2 tabs daily    Warfarin Sodium 5 Mg Tabs (Warfarin sodium) .[name]... Take as directed by dr. Dimas Aguas    Metoprolol Succinate 25 Mg Xr24h-tab (Metoprolol succinate) .[name]... Take one tablet by mouth daily Her atrial fibrillation is controlled.  I am not inclined to change her medicines today.  I will see her for followup.  Patient Instructions: 1)  Your physician wants you to follow-up in: 3 months. You will receive a reminder letter in the mail one-two months in advance. If you don't receive a letter, please call our office to schedule the follow-up appointment. 2)  Your physician recommends that you continue on your current medications as directed. Please refer to the Current Medication list given to you today.

## 2010-09-25 NOTE — Letter (Signed)
Summary: External Correspondence/ OFFICE VISIT-DR. HOWARD  External Correspondence/ OFFICE VISIT-DR. HOWARD   Imported By: Dorise Hiss 07/03/2009 16:32:51  _____________________________________________________________________  External Attachment:    Type:   Image     Comment:   External Document

## 2010-09-25 NOTE — Progress Notes (Signed)
Summary: IS IT SAFE TO TAKE AMBIEN  Phone Note Call from Patient Call back at Home Phone [phone] Call back at [phone]   Caller: Patient Call For: nurse Summary of Call: patient was given ambien 5mg  rx from PCP and want to know if its safe to take with her heart condition.  Initial call taken by: Carlye Grippe,  November 01, 2009 2:18 PM  Follow-up for Phone Call        YES   it is safe.  Talitha Givens, MD, [facility]  November 01, 2009 4:49 PM  Additional Follow-up for Phone Call Additional follow up Details #1::        Patient informed of the above.  Additional Follow-up by: Carlye Grippe,  November 02, 2009 8:25 AM

## 2010-09-25 NOTE — Letter (Signed)
Summary: External Correspondence/ OFFICE NOTE [facility]  External Correspondence/ OFFICE NOTE [facility]   Imported By: Dorise Hiss 07/27/2009 17:49:57  _____________________________________________________________________  External Attachment:    Type:   Image     Comment:   External Document

## 2010-09-25 NOTE — Progress Notes (Signed)
Summary: PAIN IN THE CENTER OF HER CHEST  Phone Note Call from Patient Call back at Home Phone [phone]   Caller: Patient Reason for Call: Talk to Nurse Details for Reason: PAIN IN CENTER OF CHEST Summary of Call: [facility] CALLED TO CHECK WITH [name] [name] TO SEE HOW SHE WAS AFTER HER CARDIOVERSION.  SHE EXPLAINED TO THEM THAT SHE HAS BEEN EXPERIENCING PAIN IN THE CENTER OF HER CHEST. THEY ADVISED HER TO CONTACT OUR OFFICE TO SEE WHAT SHE NEEDED TO DO.  Initial call taken by: Claudette Laws,  June 09, 2009 1:32 PM  Follow-up for Phone Call        Spoke with pt. regarding her chest pain.  Stated she does feel some better today.  Pain described as being inbetween her breast and squeezing feeling.  After further questioning, she did report stomach pain/cramping last p.m. with one loose stool and one more this a.m.   Nausea also last p.m. but none today.  No c/o any other symptoms.  Had pt. check BP and HR and readings were:  149/72 and HR 80.   Pt. just recently had DCCV and was converted to NSR after one shock.  Advised if she had spell like this again to go to ER.  Also, told her to keep log of BP and HR over weekend and to call if any changes.  ER if symptoms worsen.  She does have a OV scheduled for 10/20 with Dr. Myrtis Ser. Follow-up by: [name], LPN,  June 09, 2009 5:04 PM

## 2010-09-25 NOTE — Progress Notes (Signed)
Summary: Holter  Phone Note Call from Patient Call back at Home Phone [phone]   Summary of Call: Pt called stating she had a monitor placed yesterday and she doesn't know how to work it. Notified pt regarding monitor instructions and to record any events in diary. Pt aware to remove monitor tomorrow and return monday am. Pt verbalized understanding.  Initial call taken by: Cyril Loosen, RN, BSN,  September 15, 2009 9:42 AM

## 2010-09-25 NOTE — Miscellaneous (Signed)
  Clinical Lists Changes  Problems: Added new problem of CAROTID ARTERY DISEASE (ICD-433.10) Removed problem of CAROTID BRUIT (ICD-785.9) Removed problem of CAROTID ARTERY STENOSIS (ICD-433.10) Observations: Added new observation of PAST MED HX: CAROTID ARTERY STENOSIS (ICD-433.10)..Dopplers 2009 better than past..  /  doppler.Marland KitchenMarland Kitchen1/13/2011...less than 50% bilateral..stable HYPERLIPIDEMIA-MIXED (ICD-272.4) HYPERTENSION, UNSPECIFIED (ICD-401.9) DYSPNEA (ICD-786.05) ATRIAL FIBRILLATION (ICD-427.31)..cardioversion prior to April 05, 2009.. but reverted atrial fibrillation   / consultation Dr.Klein...decision to use Propafenone and cardiovert again..cardioversion June 08, 2009.[name] on 4 days Propafenone... 100 Joules... normal sinus rhythm... Cardizem stopped.. /  return to atrial fib.[name]   /   cardionet on cardizem...09/14/2009...rates  52-190. GERD Chest pain.... cardiac catheterization.. 2009... no significant CAD.[name] / recurrent chest pain 2010.. consider GI... aspirin stopped EF.. normal Anxiety.. may play a role with some symptoms Coumadin therapy Low-voltage EKG.... no evidence amyloid by echo Visual changes.. prior planned to have ophthalmology evaluation   (10/05/2009 13:47) Added new observation of REFERRING MD: Jerral Bonito (10/05/2009 13:47) Added new observation of PRIMARY MD: Selinda Flavin, MD (10/05/2009 13:47)       Past History:  Past Medical History: CAROTID ARTERY STENOSIS (ICD-433.10)..Dopplers 2009 better than past..  /  doppler.Marland KitchenMarland Kitchen1/13/2011...less than 50% bilateral..stable HYPERLIPIDEMIA-MIXED (ICD-272.4) HYPERTENSION, UNSPECIFIED (ICD-401.9) DYSPNEA (ICD-786.05) ATRIAL FIBRILLATION (ICD-427.31)..cardioversion prior to April 05, 2009.. but reverted atrial fibrillation   / consultation Dr.Klein...decision to use Propafenone and cardiovert again..cardioversion June 08, 2009.[name] on 4 days Propafenone... 100 Joules... normal sinus rhythm... Cardizem stopped.. /  return to  atrial fib.[name]   /   cardionet on cardizem...09/14/2009...rates  52-190. GERD Chest pain.... cardiac catheterization.. 2009... no significant CAD.[name] / recurrent chest pain 2010.. consider GI... aspirin stopped EF.. normal Anxiety.. may play a role with some symptoms Coumadin therapy Low-voltage EKG.... no evidence amyloid by echo Visual changes.. prior planned to have ophthalmology evaluation

## 2010-09-25 NOTE — Progress Notes (Signed)
Summary: DCCV  ---- Converted from flag ---- ---- 05/21/2009 12:30 PM, Talitha Givens, MD, [facility] wrote: OK with me, but review entire plan with me again on 05/24/2009. Thanks,  Myrtis Ser ---- 05/19/2009 1:31 PM, Cyril Loosen, RN, BSN wrote: Received request from Dr. Graciela Husbands to set this pt up for DCCV at [facility]. She is on coumadin and per Dr. Graciela Husbands her last few readings were >3. She is actually followed by Dr. Dimas Aguas. She has not had a PT/INR done since 2.8, though her INR has been no lower than 2.4 since May. I scheduled this tenatively for 9/30 with you as you are her primary cardiologist and the hospital doc that day. She will  have PT/INR done at pre-op visit on 9/28. Are you okay with doing this since she has not had weekly INR's? Thanks, Antony Contras ------------------------------  Pt notified. She is aware DCCV is scheduled for Thursday 9/30 (2:10) and that she should go for pre-op visit on Tuesday, 9/28 at 10:30.   Appended Document: Orders Update    Clinical Lists Changes  Orders: Added new Referral order of Cardioversion (Cardioversion) - Signed      Appended Document: Orders Update Pt's dgt called and wanted to R/S DCCV to a time she would be available. Now scheduled for 10/14 at 10:30 with pre-op 10/13 at 8am. Dgt is aware to have pt come by office to p/u [facility] pre-op folder, if possible, before pre-op appt.   Clinical Lists Changes  Orders: Added new Test order of T-Basic Metabolic Panel [id number]) - Signed Added new Test order of T-Protime, Auto ([id number]) - Signed

## 2010-09-25 NOTE — Letter (Signed)
Summary: MD orders  MD orders   Imported By: Kassie Mends 05/29/2009 08:31:00  _____________________________________________________________________  External Attachment:    Type:   Image     Comment:   External Document

## 2010-09-25 NOTE — Consult Note (Signed)
Summary: CONVERSION  CONVERSION   Imported By: Zachary George 04/05/2009 11:27:00  _____________________________________________________________________  External Attachment:    Type:   Image     Comment:   External Document

## 2010-09-25 NOTE — Assessment & Plan Note (Signed)
Summary: 2 MO FU -SRS   Visit Type:  Follow-up Referring Provider:  Jerral Bonito Primary Provider:  Prentiss Bells  CC:  atrial fibrillation.  History of Present Illness: The patient is seen for followup of atrial fibrillation and coronary disease.  In the past the patient had been cardioverted to sinus rhythm but returned to atrial fib.  I saw her in August, 2010 and at that time I had decided to leave her in atrial fibrillation.  Ultimately Dr. Dimas Aguas and Dr. Graciela Husbands talked and the patient saw Dr. Graciela Husbands for very complete evaluation.  His data is in the chart.  He felt it was reasonable to try to see if the patient could be returned to normal sinus rhythm.  It is note he relates that there was question that the patient felt better when she was in sinus rhythm.  For this reason he arranged for her to receive  Porpafenone for 4 days prior to her cardioversion.  Cardioversion was done by me on June 08, 2009.  She converted to sinus rhythm with one shock of 100 Joules.  Her rate was relatively slow and Cardizem was stopped.  Since that time the patient has not noticed any significant difference in how she feels.  She did have chest pain on one occasion.  We know that her coronaries revealed no significant disease by catheterization in 2009.  Because of the possibility that she might have GI symptoms, her aspirin has been stopped.  Also her simvastatin has been stopped.  She is concerned about the stopping of her simvastatin would like to restart.  She has normal LV function.  Preventive Screening-Counseling & Management  Alcohol-Tobacco     Smoking Status: never  Current Medications (verified): 1)  Multivitamins   Tabs (Multiple Vitamin) .... Once Daily 2)  Calcium Carbonate-Vitamin D 600-400 Mg-Unit  Tabs (Calcium Carbonate-Vitamin D) .... Take 1 Tablet By Mouth Two Times A Day 3)  Prilosec Otc 20 Mg Tbec (Omeprazole Magnesium) .... Take 1 Tablet By Mouth Once A Day 4)  Digoxin 0.125 Mg Tabs  (Digoxin) .... Take 2 Tabs Daily 5)  Lorazepam 2 Mg Tabs (Lorazepam) .... Take 1 Tab By Mouth At Bedtime 6)  Furosemide 20 Mg Tabs (Furosemide) .... Once Daily 7)  Warfarin Sodium 5 Mg Tabs (Warfarin Sodium) .... Take As Directed By Dr. Dimas Aguas 8)  Propafenone Hcl 225 Mg Tabs (Propafenone Hcl) .... Take One Tablet By Mouth Two Times A Day  Allergies (verified): No Known Drug Allergies  Past History:  Past Medical History: Last updated: 06/13/2009 CAROTID ARTERY STENOSIS (ICD-433.10)..Dopplers 2009 better than the past.. needs followup HYPERLIPIDEMIA-MIXED (ICD-272.4) HYPERTENSION, UNSPECIFIED (ICD-401.9) DYSPNEA (ICD-786.05) ATRIAL FIBRILLATION (ICD-427.31)..cardioversion prior to April 05, 2009.. but reverted atrial fibrillation   / consultation Dr.Klein...decision to use Propafenone and cardiovert again..cardioversion June 08, 2009.[name] on 4 days Propafenone... 100 Joules... normal sinus rhythm... Cardizem stopped.[name] GERD Chest pain.... cardiac catheterization.. 2009... no significant CAD.[name] / recurrent chest pain 2010.. consider GI... aspirin stopped EF.. normal Anxiety.. may play a role with some symptoms Coumadin therapy Low-voltage EKG.... no evidence amyloid by echo Visual changes.. prior planned to have ophthalmology evaluation    Review of Systems       The patient denies fever, chills, headache, sweats, rash, change in vision, change in hearing.  She does mention one episode of chest discomfort as described above.  There is no significant cough or shortness of breath.  She is not having any nausea or vomiting.  There no urinary symptoms.  She has no musculoskeletal complaints as of today.  All other systems are reviewed and are negative.  Vital Signs:  Patient profile:   77 year old female Height:      60 inches Weight:      107 pounds Pulse rate:   72 / minute BP sitting:   118 / 71  (left arm) Cuff size:   regular  Vitals Entered By: [name], LPN (June 14, 2009  1:39 PM) CC: atrial fibrillation Is Patient Diabetic? No Comments DCCV on 10/14   Physical Exam  General:  the patient is stable today. Head:  head is normocephalic. Eyes:  there is no xanthelasma. Neck:  no jugular venous distention.  No carotid bruits. Chest Wall:  chest wall is not tender today. Lungs:  lungs are clear.  Respiratory effort is nonlabored. Heart:  cardiac exam reveals a small and S2.  The rhythm is irregularly irregular. Abdomen:  abdomen is soft. Msk:  the patient is thin with mild kyphosis. Extremities:  there is no peripheral edema Skin:  there no skin rashes. Psych:  patient is oriented to person time and place.  Affect is normal   Impression & Recommendations:  Problem # 1:  GERD (ICD-530.81) It is possible that some of her symptoms may be from GERD.  She is on Prilosec. she is not on aspirin.  She is on Coumadin because of her atrial fib.  No change in her meds for this problem.  Problem # 2:  COUMADIN THERAPY (ICD-V58.61) The patient is on Coumadin and this will remain the same.  Problem # 3:  * LOW-VOLTAGE EKG The patient has a low-voltage EKG by history.  She has had an echo to be sure that there is no evidence of amyloid.  There is definitely no evidence of amyloid by echo.  I have carefully reviewed this report.  Problem # 4:  CHEST PAIN (ICD-786.50) The patient did have one episode of chest pain this past week.  At this point I'm not convinced that this is cardiac.  I am hesitant to push her other testing at this time.  It is possible that her rate may have increased.  Problem # 5:  CAROTID ARTERY STENOSIS (ICD-433.10)  When I see her next I will arrange for followup carotid Dopplers.  Problem # 6:  HYPERLIPIDEMIA-MIXED (ICD-272.4) Patient has been off her simvastatin and she would like to restart it.  I'm comfortable with this and she will restart simvastatin.  Problem # 7:  HYPERTENSION, UNSPECIFIED (ICD-401.9) Blood pressure is adequately  controlled today.  If we decide to restart Cardizem it will not be to treat her blood pressure at this point.  Problem # 8:  ATRIAL FIBRILLATION (ICD-427.31) EKG is done today.  It is reviewed by me. The EKG reveals atrial fibrillation with a controlled ventricular response with a rate of 67 beats per minute. The QT interval corrected is 314 ms.  I have explained this to the patient.  At this point I will not make any changes as of today.  My impression is that we may want to stop her Propafenone and leave her in atrial fibrillation.  However I want to speak with Dr. Graciela Husbands first.  Other options would include a higher dose of propafenone or trying another medication.  Other Orders: EKG w/ Interpretation (93000)  Patient Instructions: 1)  Your physician recommends that you schedule a follow-up appointment in: 6 weeks 07/28/09 @11 :15am 2)  Your physician has recommended you make the  following change in your medication: restart simvastatin 20mg  that you already have at home

## 2010-09-25 NOTE — Progress Notes (Signed)
Summary: **SK** echo/mri results  Phone Note Call from Patient Call back at [phone]   Caller: Daughter/[name] [name] Reason for Call: Lab or Test Results Summary of Call: request mri and echo results Initial call taken by: Migdalia Dk,  May 16, 2009 1:33 PM  Follow-up for Phone Call        S/W [name] [name] and advised her that we just received results yesterday. Dr. Graciela Husbands can review them on Fri and I will get back with her. She expressed understanding. Follow-up by: Duncan Dull, RN, BSN,  May 16, 2009 3:51 PM  Additional Follow-up for Phone Call Additional follow up Details #1::        echo is ok with nl lv funcdtion and mod mr Additional Follow-up by: Nathen May, MD, [facility],  May 19, 2009 8:47 AM

## 2010-09-25 NOTE — Progress Notes (Signed)
Summary:  A.FIb  Phone Note Outgoing Call Call back at [facility] Phone [phone] Call back at [phone]   Call placed by: Cyril Loosen, RN, BSN,  May 05, 2009 1:59 PM Summary of Call: Dr. Earnestine Leys received a call from Dr. Dimas Aguas who is the pt's primary MD. He stated the pt's dgt was not happy that the pt was still in a.fib. She is a Engineer, civil (consulting) in [place] and said she wants something done asap or she will have pt seen in [place]. Dr. Earnestine Leys s/w pt and notified her he could see her in the office today or she could see Dr. Graciela Husbands next week. He states she will see Dr. Graciela Husbands next week. Pt scheduled to see Dr. Graciela Husbands on 05/11/09 at 0830. Pt notified and verbalized understanding. She is also aware that she is being seen as an add-on and may have to wait. Initial call taken by: Cyril Loosen, RN, BSN,  May 05, 2009 2:07 PM

## 2010-09-25 NOTE — Assessment & Plan Note (Signed)
Summary: 3 mo fu per sept reminder-srs   Visit Type:  Follow-up Primary Linnaea Ahn:  Selinda Flavin, MD  CC:  atrial fibrillation.  History of Present Illness: The patient is seen for followup of atrial relation.  Her rate has been controlled.  When I saw her in June, 2011 digoxin level was checked and it was 0.8.  The med was not adjusted.  At that time she has been having a sensation of feeling off balance.  This has improved.  She is doing very well.   Current Medications (verified): 1)  Multivitamins   Tabs (Multiple Vitamin) .... Once Daily 2)  Calcium Carbonate-Vitamin D 600-400 Mg-Unit  Tabs (Calcium Carbonate-Vitamin D) .... Take 1 Tablet By Mouth Two Times A Day 3)  Prilosec Otc 20 Mg Tbec (Omeprazole Magnesium) .... Take 1 Tablet By Mouth Once A Day As Needed 4)  Digoxin 0.125 Mg Tabs (Digoxin) .... Take 2 Tabs Daily 5)  Furosemide 20 Mg Tabs (Furosemide) .... Once Daily 6)  Warfarin Sodium 2 Mg Tabs (Warfarin Sodium) .... Use As Directed/checked At [facility] 7)  Simvastatin 20 Mg Tabs (Simvastatin) .... Take 1 Tablet By Mouth Once A Day 8)  Preservision/lutein  Caps (Multiple Vitamins-Minerals) .... Take 1 Tablet By Mouth Two Times A Day 9)  Diltiazem Hcl Er Beads 180 Mg Xr24h-Cap (Diltiazem Hcl Er Beads) .... Take 1 Tablet By Mouth Once A Day 10)  Metoprolol Succinate 25 Mg Xr24h-Tab (Metoprolol Succinate) .... Take One Tablet By Mouth Daily 11)  Temazepam 15 Mg Caps (Temazepam) .... Take 1 Tablet By Mouth Once A Day At Bedtime  Allergies (verified): No Known Drug Allergies  Past History:  Past Medical History: CAROTID ARTERY STENOSIS (ICD-433.10)..Dopplers 2009 better than past..  /  doppler.Marland KitchenMarland Kitchen1/13/2011...less than 50% bilateral..stable HYPERLIPIDEMIA-MIXED (ICD-272.4) HYPERTENSION, UNSPECIFIED (ICD-401.9) DYSPNEA (ICD-786.05) ATRIAL FIBRILLATION (ICD-427.31)..cardioversion prior to April 05, 2009.. but reverted atrial fibrillation   / consultation Dr.Klein...decision  to use Propafenone and cardiovert again..cardioversion June 08, 2009.[name] on 4 days Propafenone... 100 Joules... normal sinus rhythm... Cardizem stopped.. /  return to atrial fib.[name]   /   cardionet on cardizem...09/14/2009...rates  52-190. GERD Chest pain.... cardiac catheterization.. 2009... no significant CAD.[name] / recurrent chest pain 2010.. consider GI... aspirin stopped EF..  65%... echo.Marland KitchenMarland KitchenSeptember, 2010 Aortic valve sclerosis   no stenosis.... echo... September, 2010 Anxiety.. may play a role with some symptoms Coumadin therapy Low-voltage EKG.... no evidence amyloid by echo Visual changes.. prior planned to have ophthalmology evaluation    Review of Systems       Patient denies fever, chills, headache, sweats, rash, change in vision, change in hearing, chest pain, cough, nausea vomiting, urinary symptoms.  All of the systems are reviewed and are negative.  Vital Signs:  Patient profile:   77 year old female Height:      60 inches Weight:      108 pounds BMI:     21.17 Pulse rate:   88 / minute Resp:     16 per minute BP sitting:   120 / 72  (left arm)  Vitals Entered By: Marrion Coy, CNA (May 03, 2010 3:00 PM)  Physical Exam  General:  patient looks quite stable today. Eyes:  no xanthelasma. Neck:  no jugular venous distention. Lungs:  lungs are clear.  Respiratory effort is nonlabored. Heart:  cardiac exam reveals S1-S2.  Rhythm is irregularly irregular. Abdomen:  abdomen is soft. Extremities:  no peripheral edema. Psych:  patient is oriented to person time and place.  Affect is normal.   Impression & Recommendations:  Problem # 1:  * SENSATION OF FEELING OFF BALANCE. this problem has improved.  No further workup.  Problem # 2:  CAROTID ARTERY DISEASE (ICD-433.10)  Her updated medication list for this problem includes:    Warfarin Sodium 2 Mg Tabs (Warfarin sodium) ..... Use as directed/checked at [facility] Carotid artery disease is stable.  No further  workup.  Problem # 3:  CHEST PAIN (ICD-786.50)  Her updated medication list for this problem includes:    Warfarin Sodium 2 Mg Tabs (Warfarin sodium) ..... Use as directed/checked at [facility]    Diltiazem Hcl Er Beads 180 Mg Xr24h-cap (Diltiazem hcl er beads) .[name]... Take 1 tablet by mouth once a day    Metoprolol Succinate 25 Mg Xr24h-tab (Metoprolol succinate) .[name]... Take one tablet by mouth daily The patient is not having any chest pain.  No further workup  Problem # 4:  HYPERTENSION, UNSPECIFIED (ICD-401.9)  Her updated medication list for this problem includes:    Furosemide 20 Mg Tabs (Furosemide) ..... Once daily    Diltiazem Hcl Er Beads 180 Mg Xr24h-cap (Diltiazem hcl er beads) .[name]... Take 1 tablet by mouth once a day    Metoprolol Succinate 25 Mg Xr24h-tab (Metoprolol succinate) .[name]... Take one tablet by mouth daily Blood pressure is nicely controlled.  No further workup.  Problem # 5:  ATRIAL FIBRILLATION (ICD-427.31)  Her updated medication list for this problem includes:    Digoxin 0.125 Mg Tabs (Digoxin) .[name]... Take 2 tabs daily    Warfarin Sodium 2 Mg Tabs (Warfarin sodium) ..... Use as directed/checked at [facility]    Metoprolol Succinate 25 Mg Xr24h-tab (Metoprolol succinate) .[name]... Take one tablet by mouth daily Atrial fibrillation controlled.  No further workup.  Patient Instructions: 1)  Your physician wants you to follow-up in: 6 months. You will receive a reminder letter in the mail one-two months in advance. If you don't receive a letter, please call our office to schedule the follow-up appointment. 2)  Your physician recommends that you continue on your current medications as directed. Please refer to the Current Medication list given to you today.

## 2010-09-25 NOTE — Procedures (Signed)
Summary: Holter and Event  Holter and Event   Imported By: Zachary George 04/05/2009 11:20:22  _____________________________________________________________________  External Attachment:    Type:   Image     Comment:   External Document

## 2010-09-27 NOTE — Progress Notes (Signed)
Summary: RX REFILL AND POSSIBLE CHANGE OF MEDS  Phone Note Call from Patient Call back at Home Phone [phone]   Caller: PT WALK IN Reason for Call: Refill Medication Summary of Call: PT WOULD LIKE 90 DAY SUPPLY OF METOPROLOL CALLED IN TO [facility] IN [place].  SHE ALSO HAS A QUESTION ABOUT SWITCHING FROM THIS DRUG TO ANOTHER ONE THAT WILL BE CHEEPER AT THE END OF THE YEAR HER INSURANCE IN GOING TO GO FROM PAYING $7 FOR 30 DAY TO $45 FOR 30 DAYS. Initial call taken by: Faythe Ghee,  August 03, 2010 2:17 PM  Follow-up for Phone Call        Left message for pt to return call Cyril Loosen, RN, BSN  August 03, 2010 3:50 PM  Pt states in January the Metoprolol ER will go to $35 for 30 day supply. She would like this changed to a cheaper solution, if possible. Cyril Loosen, RN, BSN  August 03, 2010 4:40 PM   Additional Follow-up for Phone Call Additional follow up Details #1::        Use Metoprolol Tartrate 25 two times a day.  Talitha Givens, MD, [facility]  August 10, 2010 9:50 AM Left message to call back on voicemail. Cyril Loosen, RN, BSN  August 10, 2010 11:58 AM  Pt notified and verbalized understanding.  Additional Follow-up by: Cyril Loosen, RN, BSN,  August 10, 2010 2:47 PM    New/Updated Medications: METOPROLOL TARTRATE 25 MG TABS (METOPROLOL TARTRATE) Take one tablet by mouth twice a day Prescriptions: METOPROLOL TARTRATE 25 MG TABS (METOPROLOL TARTRATE) Take one tablet by mouth twice a day  #180 x 2   Entered by:   Cyril Loosen, RN, BSN   Authorized by:   Talitha Givens, MD, [facility]   Signed by:   Cyril Loosen, RN, BSN on 08/10/2010   Method used:   Electronically to        [facility]* (retail)       [address]       [place], [place]  [address]       Ph: [phone]       Fax: [phone]   RxID:   [id number] METOPROLOL SUCCINATE 25 MG XR24H-TAB (METOPROLOL SUCCINATE) Take one tablet by mouth daily  #90 x 2  Entered by:   Cyril Loosen, RN, BSN   Authorized by:   Talitha Givens, MD, [facility]   Signed by:   Cyril Loosen, RN, BSN on 08/03/2010   Method used:   Electronically to        [facility] (retail)       [address]       [place], [place]  [address]       Ph: [phone]       Fax: [phone]   RxID:   [id number]

## 2010-11-09 ENCOUNTER — Encounter: Payer: Self-pay | Admitting: *Deleted

## 2010-11-18 ENCOUNTER — Encounter: Payer: Self-pay | Admitting: Cardiology

## 2010-11-18 DIAGNOSIS — I10 Essential (primary) hypertension: Secondary | ICD-10-CM | POA: Insufficient documentation

## 2010-11-18 DIAGNOSIS — I6529 Occlusion and stenosis of unspecified carotid artery: Secondary | ICD-10-CM | POA: Insufficient documentation

## 2010-11-18 DIAGNOSIS — R9431 Abnormal electrocardiogram [ECG] [EKG]: Secondary | ICD-10-CM | POA: Insufficient documentation

## 2010-11-18 DIAGNOSIS — Z79899 Other long term (current) drug therapy: Secondary | ICD-10-CM | POA: Insufficient documentation

## 2010-11-18 DIAGNOSIS — K219 Gastro-esophageal reflux disease without esophagitis: Secondary | ICD-10-CM | POA: Insufficient documentation

## 2010-11-18 DIAGNOSIS — E785 Hyperlipidemia, unspecified: Secondary | ICD-10-CM | POA: Insufficient documentation

## 2010-11-18 DIAGNOSIS — I358 Other nonrheumatic aortic valve disorders: Secondary | ICD-10-CM | POA: Insufficient documentation

## 2010-11-18 DIAGNOSIS — F419 Anxiety disorder, unspecified: Secondary | ICD-10-CM | POA: Insufficient documentation

## 2010-11-18 DIAGNOSIS — R0600 Dyspnea, unspecified: Secondary | ICD-10-CM | POA: Insufficient documentation

## 2010-11-18 DIAGNOSIS — H539 Unspecified visual disturbance: Secondary | ICD-10-CM | POA: Insufficient documentation

## 2010-11-18 DIAGNOSIS — R079 Chest pain, unspecified: Secondary | ICD-10-CM | POA: Insufficient documentation

## 2010-11-19 ENCOUNTER — Encounter: Payer: Self-pay | Admitting: Cardiology

## 2010-11-19 ENCOUNTER — Ambulatory Visit (INDEPENDENT_AMBULATORY_CARE_PROVIDER_SITE_OTHER): Payer: Medicare Other | Admitting: Cardiology

## 2010-11-19 DIAGNOSIS — I10 Essential (primary) hypertension: Secondary | ICD-10-CM

## 2010-11-19 DIAGNOSIS — Z5189 Encounter for other specified aftercare: Secondary | ICD-10-CM

## 2010-11-19 DIAGNOSIS — I4891 Unspecified atrial fibrillation: Secondary | ICD-10-CM

## 2010-11-19 DIAGNOSIS — I359 Nonrheumatic aortic valve disorder, unspecified: Secondary | ICD-10-CM

## 2010-11-19 DIAGNOSIS — R9431 Abnormal electrocardiogram [ECG] [EKG]: Secondary | ICD-10-CM

## 2010-11-19 DIAGNOSIS — I358 Other nonrheumatic aortic valve disorders: Secondary | ICD-10-CM

## 2010-11-19 DIAGNOSIS — Z79899 Other long term (current) drug therapy: Secondary | ICD-10-CM

## 2010-11-19 NOTE — Assessment & Plan Note (Signed)
Blood pressure is well controlled. No change in therapy. 

## 2010-11-19 NOTE — Assessment & Plan Note (Signed)
Aortic valve sclerosis is mild.  No further workup needed.

## 2010-11-19 NOTE — Assessment & Plan Note (Signed)
Patient continues on Coumadin. No change in therapy. 

## 2010-11-19 NOTE — Assessment & Plan Note (Signed)
Historically the patient has had low voltage on her EKG.  No further workup is needed.

## 2010-11-19 NOTE — Assessment & Plan Note (Signed)
Atrial fibrillation rate is stable.  No change in therapy.  Patient is doing very well.  I will see her back in 6 months for followup.

## 2010-11-19 NOTE — Patient Instructions (Signed)
Your physician recommends that you continue on your current medications as directed. Please refer to the Current Medication list given to you today.     

## 2010-11-19 NOTE — Progress Notes (Signed)
HPI Patient returns for followup of atrial fibrillation.  She is doing very well.  I saw her last September, 2011.  She's not having any chest pain or palpitations. No Known Allergies  Current Outpatient Prescriptions  Medication Sig Dispense Refill  . Calcium Carbonate-Vitamin D (CALCIUM 600+D) 600-400 MG-UNIT per tablet Take 1 tablet by mouth 2 (two) times daily.        . digoxin (LANOXIN) 0.25 MG tablet Take 250 mcg by mouth daily.        [name] diltiazem (CARDIZEM CD) 180 MG 24 hr capsule Take one tab daily      . furosemide (LASIX) 20 MG tablet Take 1 tablet by mouth daily.       . metoprolol tartrate (LOPRESSOR) 25 MG tablet Take 25 mg by mouth 2 (two) times daily.        . Multiple Vitamin (MULTIVITAMIN) tablet Take 1 tablet by mouth daily.        . simvastatin (ZOCOR) 20 MG tablet Take 20 mg by mouth at bedtime.        . temazepam (RESTORIL) 15 MG capsule Take 15 mg by mouth at bedtime as needed.        . warfarin (COUMADIN) 2 MG tablet Take daily by mouth as directed by [facility] with Dr. Dimas Aguas.       . omeprazole (PRILOSEC OTC) 20 MG tablet Take 1 tablet by mouth daily as needed.       [name] DISCONTD: digoxin (LANOXIN) 0.125 MG tablet Take 2 tablets by mouth daily.        [name] DISCONTD: Multiple Vitamins-Minerals (PRESERVISION/LUTEIN PO) Take 1 tablet by mouth two times a day.         History   Social History  . Marital Status: Widowed    Spouse Name: N/A    Number of Children: N/A  . Years of Education: N/A   Occupational History  . Not on file.   Social History Main Topics  . Smoking status: Never Smoker   . Smokeless tobacco: Not on file  . Alcohol Use: No  . Drug Use: No  . Sexually Active: Not on file   Other Topics Concern  . Not on file   Social History Narrative  . No narrative on file    Family History  Problem Relation Age of Onset  . Cancer Other   . Coronary artery disease Other     Past Medical History  Diagnosis Date  . Carotid  stenosis     Dopplers 2009 better than past; Dopplers 09/07/2009 <50% bilateral-stable  . Hyperlipidemia   . Hypertension   . Dyspnea   . Atrial fibrillation     DCCV prior to 04/05/2009 but reverted A.Fib, consultation w/Dr.Klein, decision to use Propafenone and   cardiovert again..cardioversion June 08, 2009.[name] on 4 days Propafenone... 100 Joules... normal sinus rhythm... Cardizem stopped.. /  return to atrial fib.[name]   /   cardionet on cardizem...09/14/2009...rates  52-190.  On Coumadin  . GERD (gastroesophageal reflux disease)     Aspirin Stopped  . Chest pain     cardiac catheterization.. 2009... no significant CAD.[name] / recurrent chest pain 2010.. consider; EF 65% by Echo 04/2009  . Aortic valve sclerosis     Echo  04/2009  EF 65%.no stenosis.... echo... September, 2010  . Anxiety     May play a role with some symptoms  . Visual changes     Prior planned to have ophthalmology evlaution  . Drug  therapy     coumadin  . Abnormal EKG     low voltage (no amyloid)    Past Surgical History  Procedure Date  . Back surgery     X 2, most recent 2009  . Cataract extraction, bilateral     ROS  Patient denies fever, chills, headache, sweats, rash, change in vision, change in hearing, chest pain, cough, nausea vomiting, urinary symptoms.  All other systems are reviewed and are negative. PHYSICAL EXAM Patient is very stable.  She is oriented to person time and place.  Affect is normal.  Head is atraumatic.  There is no xanthelasma.  There is no jugulovenous distention.  Lungs are clear.  Respiratory effort is nonlabored.  Cardiac exam reveals an S1 and S2.  There are no clicks.  There is a very soft systolic murmur.  The abdomen is soft.  There is no peripheral edema.  There are no musculoskeletal deformities. Filed Vitals:   11/19/10 1039  BP: 113/73  Pulse: 43  Height: 5\' 3"  (1.6 m)  Weight: 109 lb (49.442 kg)    EKG  Not done today.  ASSESSMENT & PLAN

## 2011-01-08 NOTE — Assessment & Plan Note (Signed)
[facility]                          [facility] OFFICE NOTE   NATARA, MONFORT                      MRN:          [id number]  DATE:04/05/2009                            DOB:          1925/08/19    Ms. Klassen is seen for cardiology follow-up.  She has atrial  fibrillation.  We had been anticoagulating her very carefully and  planning for a cardioversion.  She actually underwent cardioversion by  Dr. Andee Lineman on June 23.  She received 100 joules with anterior-posterior  pads and converted to sinus rhythm.  After that time she was seen back  in Dr. Jeannette How office.  An EKG was done and it was found incidentally  that she was back in atrial fibrillation.  Her rate was controlled.  She  did not know she was back in atrial fibrillation.  She has had some  dyspnea and fatigue.  It is very difficult to know if her symptoms are  related to her atrial fib.  She has been able to be active.  She has had  some anxiety about all of this.   Today during the office visit, we reached the patient's daughter by  telephone.  I had an extensive discussion with her.  I explained to her  that I felt that we should leave her in atrial fibrillation at this time  and that her heart rate was under control.  We also talked about the  patient's chest pain.  The patient's daughter mention to me that she  felt that the patient was very anxious about the situation and asked if  we might be able to give her some antianxiety meds.   PAST MEDICAL HISTORY:   ALLERGIES:  No known drug allergies.   MEDICATIONS:  Multivitamins, ranitidine, digoxin, lorazepam, furosemide,  Coumadin, simvastatin and diltiazem.   OTHER MEDICAL PROBLEMS:  See the list below.   REVIEW OF SYSTEMS:  The patient denies any fevers, chills or rashes.  She has no sweats.  There are no headaches.  There is no change in  vision or hearing.  She has some chest discomfort at night when she  rolls into  different positions in her bed.  She is not having exertional  chest pain.  She has some shortness of breath but it has not changed.  She is not having any GI or GU symptoms.  All of her systems are  reviewed and are negative.   PHYSICAL EXAMINATION:  VITAL SIGNS:  Blood pressure is 120/70 with a  pulse of 73.  The patient is oriented to person, time and place.  Affect  is normal.  HEENT:  Reveals no xanthelasma.  She has normal extraocular  motion.  There are no carotid bruits.  There is no jugular venous  distention.  LUNGS:  Clear.  Respiratory effort is not labored.  CARDIOVASCULAR:  Reveals S1 and S2.  There are no clicks or significant  murmurs.  The rhythm is irregularly irregular.  The rate is controlled.  ABDOMEN:  Soft.  She has no peripheral edema.   PROBLEMS INCLUDE:  1. Hypertension treated.  2. Atrial fibrillation.  She is now status post cardioversion on one      occasion and she has returned to atrial fibrillation.  She is      coumadinized.  We will leave her in atrial fibrillation at this      point.  We will  continue to watch her and consider over time if we      should take any other approaches.  However, at this point I plan      not to try to cardiovert her again and not to use any other      medications.  3. Gastroesophageal reflux disease.  4. Normal coronaries by catheterization in 2009.  5. Normal left ventricular function.  6. Hyperlipidemia.  7. Carotid artery disease.  Her Dopplers in 2009 were actually better      than before and we will consider a follow-up after her next visit.      8.  History of mild volume overload.  This appears to be stable at      this time.  8. Some nighttime chest pain.  I believe that this is not cardiac in      origin.  9. Questioned of anxiety.  The patient's daughter raises this is as a      question.  I will leave that to Dr. Dimas Aguas as to whether or not he      feels any med should be used.   I have had an extensive  evaluation of the patient today including  talking with her daughter by telephone in [place], [place].     Luis Abed, MD, [facility]  Electronically Signed    JDK/MedQ  DD: 04/05/2009  DT: 04/05/2009  Job #: [id number]   cc:   Selinda Flavin

## 2011-01-08 NOTE — Discharge Summary (Signed)
NAME:  [name], [name]               ACCOUNT NO.:  [id number]   MEDICAL RECORD NO.:  [id number]          PATIENT TYPE:  INP   LOCATION:  4707                         FACILITY:  [facility]   PHYSICIAN:  [name] Beals. Juanda Chance, MD, FACCDATE OF BIRTH:  07/10/1925   DATE OF ADMISSION:  01/13/2008  DATE OF DISCHARGE:  01/13/2008                               DISCHARGE SUMMARY   PRIMARY CARDIOLOGIST:  [name] Sidle, MD   PRIMARY CARE [name] [name]:  [name] [name].   DISCHARGE DIAGNOSIS:  Chest pain.   SECONDARY DIAGNOSES:  1. Normal coronary arteries by catheterization this admission.  2. Gastroesophageal reflux disease.  3. Hypertension.  4. Hyperlipidemia.  5. Peripheral vascular disease with carotid ultrasound this admission      revealing 40% to 60% stenosis on the right and 60% to 80% internal      carotid artery stenosis on the left, felt to be at the lower end of      the range.  6. History of abdominal aortic ectasia with atherosclerosis.   ALLERGIES:  She does not tolerate STATINS.   PROCEDURE:  Left heart rate catheterization and carotid ultrasound.   HISTORY OF PRESENT ILLNESS:  An 77 year old Caucasian female without  prior cardiac history.  She was in her usual state of health until  approximately 2 to 3 weeks prior to admission when she began to  experience exertional chest discomfort as well as nocturnal chest  discomfort.  She did have some associated diaphoresis and shortness of  breath.  She was seen by Dr. Dimas [name] in the office on Jan 11, 2008; and  following report of these symptoms, a decision was made to admit her for  further evaluation to [facility].  At [facility], she was  ruled out for MI and her D-dimer was negative.  She was evaluated by Dr.  Diona [name] and [name] Parma, PA, and a decision was made to transfer her  to [facility] for evaluation and catheterization.   HOSPITAL COURSE:  Left heart cardiac catheterization was undertaken this  morning  revealing normal coronary arteries with normal LV function  without regional wall motion abnormalities.  She tolerated this  procedure well.  Postprocedure, she has been ambulating without  recurrent symptoms or limitations.  She is being discharged home today  in good condition.  We have initiated proton pump inhibitor therapy, and  I have asked her to follow up with Dr. Dimas [name].   She was noted to have a left carotid bruit.  Carotid ultrasound was  performed during her hospitalization.  As above, this revealed 40% to  60% stenosis of the right internal carotid artery, felt to be on the  upper end of that range.  There was also 60% to 80% left internal  carotid artery stenosis, and this was felt to be in the lower end of  that range.  We would recommend a followup carotid ultrasound in the  outpatient setting.  The patient is asymptomatic.   DISCHARGE LABS:  Hemoglobin 10.7, hematocrit 30.9, WBC 4.7, and  platelets 246, sodium 140, potassium 3.8, chloride 102, CO2 of  27, BUN  22, creatinine 0.94, glucose 110, calcium 8.9, total cholesterol 195,  triglycerides 52, HDL 82, and LDL 103.   DISPOSITION:  The patient is being discharged home today in good  condition.   FOLLOWUP PLANS AND APPOINTMENTS:  We have asked her to follow up with  [name] [name] in 1 to 2 weeks.   DISCHARGE MEDICATIONS:  1. Protonix 4 mg daily.  2. Lopid 600 mg b.i.d.  3. Diovan HCT 160/12.5 mg daily.  4. Aspirin 325 mg daily.  5. Darvocet-N 100 1 tablet q.6 h. p.r.n. as previously prescribed.   OUTSTANDING LAB STUDIES:  None.   DURATION OF DISCHARGE ENCOUNTER:  Thirty five minutes including  physician time.      [name] [name], ANP      [name] R. Juanda Chance, MD, [facility]  Electronically Signed    CB/MEDQ  D:  01/14/2008  T:  01/15/2008  Job:  [id number]   cc:   [name] [name]

## 2011-01-08 NOTE — Assessment & Plan Note (Signed)
[facility]                          [facility] OFFICE NOTE   GIANNA, CALEF                      MRN:          [id number]  DATE:02/28/2009                            DOB:          September 20, 1924    PRIMARY CARE PHYSICIAN:  Dr. Selinda Flavin.   REASON FOR PRESENTATION:  Evaluate the patient with atrial fibrillation.   HISTORY OF PRESENT ILLNESS:  The patient is a lovely 77 year old white  female who was hospitalized in March with new-onset atrial fibrillation.  She was appropriately anticoagulated and subsequently had cardioversion  by Dr. Myrtis Ser.  She was apparently in sinus rhythm for some period of  time, but on followup EKG with Dr. Dimas Aguas on June 30 was noted to be  back in atrial fibrillation.  At the time she was diagnosed, her rate  was rapid.  She apparently had some dyspnea and fatigue.  However, until  she saw Dr. Dimas Aguas, she did not realize that she was back in atrial  fibrillation.  She states she may feel some palpitations which is rather  vague about this.  She certainly had no presyncope or syncope.  She has  been able, even since seeing Dr. Dimas Aguas on the 30th, to do her work  including weeding, all her yard work and housework.  She does not have  any limitations with this.  She does not feel any shortness of breath  and is not having any PND or orthopnea.  She is not having any chest  discomfort, neck or arm discomfort.  She is tolerating the Coumadin.  She is concerned about what she can eat.   PAST MEDICAL HISTORY:  Atrial fibrillation, hypertension, chest pain  (normal coronary arteries catheterization in May 2009), gastroesophageal  reflux disease, dyslipidemia, carotid stenosis (40-60% right stenosis  and 60-80% left stenosis).   ALLERGIES:  None.   MEDICATIONS:  1. Multivitamin.  2. Calcium.  3. Aspirin 81 mg daily.  4. Ranitidine 150 mg b.i.d.  5. Digoxin 0.125 mg daily.  6. Lorazepam.  7. Furosemide 20 mg  daily.  8. Coumadin.  9. Simvastatin 20 mg nightly.  10.Diltiazem 180 mg daily.   REVIEW OF SYSTEMS:  As stated in the HPI and otherwise negative for all  other systems.   PHYSICAL EXAMINATION:  GENERAL:  The patient is very pleasant and in no  distress.  VITAL SIGNS:  Blood pressure 128/67, heart rate 80 and irregular, weight  113.4 pounds.  HEENT:  Eyelids are unremarkable, pupils are equal, round and reactive  to light, fundi not visualized, oral mucosa unremarkable.  NECK:  No jugular venous distention at 45 degrees, carotid upstroke  brisk and symmetric, no bruits, no thyromegaly.  LYMPHATICS:  No cervical, axillary or inguinal adenopathy.  LUNGS:  Clear to auscultation bilaterally.  BACK:  No costovertebral angle tenderness.  CHEST:  Unremarkable.  HEART:  PMI not displaced or sustained, S1 and S2 within normal limits,  no S3, no S4, no clicks, no rubs, no murmurs.  ABDOMEN:  Flat, positive bowel sounds.  Normal in frequency and pitch,  no bruits, no rebound, no  guarding, no midline pulsatile mass, no  hepatomegaly, no splenomegaly.  SKIN:  No rashes, no nodules.  EXTREMITIES:  2+ pulse, no edema.   EKG; atrial fibrillation, rate 76, axis within normal limits, intervals  within normal limits, poor anterior R-wave progression, nonspecific T-  wave changes.   ASSESSMENT AND PLAN:  1. Atrial fibrillation.  The patient is back in atrial fibrillation.      She is on Coumadin.  At this point, I do not think she is      particularly symptomatic with this and I would manage her with rate      control and anticoagulation.  However, I will defer to Dr. Myrtis Ser for      the ultimate decision.  For now, I will place a 24-hour Holter      monitor to make sure she has reasonable rate control (below 110      average).  I suspect that she does.  I will have her come back to      see Dr. Myrtis Ser for 67-month followup.  2. Dyspnea.  The patient had some dyspnea and questionable volume       overload when she was in atrial fibrillation with rapid rate.  This      clearly is not a problem now.  She will continue with the current      regimen.  I do think she probably needs daily furosemide.  3. Hypertension.  Blood pressure is well controlled.  She will      continue the meds as listed.  4. Dyslipidemia.  Per Dr. Dimas Aguas.  Given her carotid stenosis, I would      suggest an LDL less than 100 and HDL greater than 50 as goals.  5. Carotid stenosis.  She can have routine followup.  6. Followup.  She will come back in 1 month to see Dr. Myrtis Ser.     Rollene Rotunda, MD, [facility]  Electronically Signed    JH/MedQ  DD: 02/28/2009  DT: 03/01/2009  Job #: [id number]   cc:   Selinda Flavin

## 2011-01-08 NOTE — Op Note (Signed)
NAMEMERRIDY, PASCOE               ACCOUNT NO.:  [id number]   MEDICAL RECORD NO.:  [id number]          PATIENT TYPE:  INP   LOCATION:  3020                         FACILITY:  [facility]   PHYSICIAN:  Coletta Memos, M.D.     DATE OF BIRTH:  07/08/1925   DATE OF PROCEDURE:  04/22/2008  DATE OF DISCHARGE:  04/23/2008                               OPERATIVE REPORT   ADMITTING DIAGNOSES:  1. Synovial cyst, left L4-L5.  2. Left L4-L5 radiculopathy.   POSTOPERATIVE DIAGNOSES:  1. Synovial cyst, left L4-5.  2. Left L4-L5 radiculopathy.   PROCEDURE:  Left L4 semihemilaminectomy with microscopic dissection for  resection of synovial cyst.   COMPLICATIONS:  None.   SURGEON:  Coletta Memos, M.D.   ASSISTANT:  Clydene Fake, M.D.   INDICATIONS:  Ms. Vanzile presented with severe pain in the left lower  extremity secondary to what on MRI appeared to be a very large synovial  cyst at the L4-L5 level.  I recommended and she agreed to undergo  operative decompression.   OPERATIVE NOTE:  Mrs. Outland was brought to the operating room,  intubated, and placed under general anesthetic.  She was rolled prone  position on to the Wilson frame and all pressure points were properly  padded.  Her back was prepped.  She was draped in a sterile fashion.  I  opened the superior portion of the previous incision.  I took this down  to the thoracolumbar fascia and then exposed the lamina.  I first was at  the L3-L4 level, so I moved down one space.  I then with the use of a  high-speed drill, performed a semihemilaminectomy of L4.  With Dr.  Doreen Beam assistance and microscopic dissection, removed tissue from the  lateral gutters.  We were able to completely free the egress  of the L5  root.  Went up a little bit more rostrally and then found a large cyst,  where there was obvious compression of the L4 root.  Removed this to  fully decompress, but I did not try to remove each and every piece of  cyst wall that  was remaining on the dura, but there was no connection to  the joint itself and the nerve roots were well decompressed.  I then  irrigated.  I then closed the wound in layered fashion using Vicryl  sutures, reapproximated the thoracolumbar, subcutaneous and subcuticular  layers.  I used Dermabond for sterile dressing.           ______________________________  Coletta Memos, M.D.     KC/MEDQ  D:  04/22/2008  T:  04/23/2008  Job:  [id number]

## 2011-01-08 NOTE — Assessment & Plan Note (Signed)
[facility]                          [facility] OFFICE NOTE   [name], [name]                      MRN:          [id number]  DATE:12/01/2008                            DOB:          11-23-1924    [name] is seen back today after her hospitalization.  She has  atrial fibrillation.  I had seen her in consultation on November 08, 2008.  She had atrial fibrillation that appeared to be relatively new in onset.  She had not had at the time of her cath in May 2009 and she was in sinus  rhythm at that time.  The patient has normal coronary arteries by cath  and she has normal LV function.  She does have significant carotid  disease.  She has hyperlipidemia and hypertension.  In the hospital,  there was atrial fibrillation with rapid rate.  A 2-D echo was done and  it showed good LV function.  There is some right ventricular dysfunction  that is mild.  It was felt that she should have a rate control medicines  and be started on Coumadin, and then after several weeks be seen for the  possibility of cardioversion.  She has been on Coumadin and we will  document the levels from Dr. Jeannette How office.  She says that her level  is now stable.   In general, she is not feeling particularly well, however.  She feels  that when she tries to walk she has fatigue and weakness.  I believe  this most likely is related to her atrial fibrillation.   PAST MEDICAL HISTORY:   ALLERGIES:  No known drug allergies.   MEDICATIONS:  1. Vitamins.  2. Calcium.  3. Aspirin.  4. Diltiazem 180 two tablets a day.  We will decrease this to once a      day.  5. Omeprazole and Zantac.  It is not clear why she is on both at this      time.  She will hold the omeprazole.  6. Digoxin 0.125 and she will need a digoxin level if this has not      been done recently.  7. Furosemide 20 and she will need a BMET.  8. Coumadin.  9. Simvastatin.   OTHER MEDICAL PROBLEMS:  See the  complete list below.   REVIEW OF SYSTEMS:  She has no fevers or chills.  There are no  headaches.  She has no skin rashes.  There has been no change in her  vision and no change in her hearing.  She has no cough.  She does have  some shortness of breath when walking now.  She has no chest pain.  She  has no GI or GU symptoms.  There are no rashes.  Her review of systems  otherwise is negative.  All systems are reviewed and are negative.   PHYSICAL EXAMINATION:  VITAL SIGNS:  Blood pressure is 117/73.  Pulse is  66.  GENERAL:  The patient is oriented to person, time, and place.  Affect is  normal.  HEENT:  No xanthelasma.  She has  normal extraocular motion.  There are  soft carotid bruits.  There is no jugular venous distention.  LUNGS:  Clear.  Respiratory effort is not labored.  CARDIAC:  S1 and S2.  There is a soft systolic murmur.  ABDOMEN:  Soft.  She has no significant peripheral edema.   EKG today reveals atrial fibrillation and the rate is controlled.   PROBLEMS:  #1.  Hypertension.  This is currently treated.  #2.  Atrial fibrillation that we believe was new onset as of November 08, 2008.  We will proceed with checking about her anticoagulation.  As soon  as we are sure she has been fully anticoagulated with an INR greater  than 2 for greater than 3 weeks, we will proceed with outpatient  cardioversion.  I discussed this with the patient at length.  I talked  about risks and benefits and she does want to proceed.  #3.  Gastroesophageal reflux disease.  She is on ranitidine and  omeprazole, and her omeprazole will be put on hold.  #4.  Normal coronary arteries in May 2009.  #5.  Normal left ventricular function.  #6.  Hyperlipidemia.  #7.  Carotids with a 40-60% right carotid stenosis and 60-80% left  stenosis (lower end of the range), but it is possible that her Dopplers  in 2009 were actually better and this will need followup.  #8.  History of some statin intolerance,  although she has been on  simvastatin.  #9.  Mild volume overload when she was recently in the hospital and this  was stabilized.   We will reduce the patient's Cardizem to 180 mg once a day.  Omeprazole  will be stopped.  Digoxin level will be checked as a trough and a BMET  will be checked.  We will communicate with Dr. Jeannette How office to be  sure that we coordinate, INR in therapeutic range for greater than 3  weeks and then we will proceed with cardioversion.     Luis Abed, MD, [facility]  Electronically Signed    JDK/MedQ  DD: 12/01/2008  DT: 12/02/2008  Job #: [id number]   cc:   Selinda Flavin

## 2011-01-11 NOTE — Op Note (Signed)
[place]. [facility]  Patient:    [name] [name] Visit Number: [id number] MRN: [id number]          Service Type: SUR Location: 3000 [id number] Attending Physician:  Coletta Memos Dictated by:   Mena Goes. Franky Macho, M.D. Proc. Date: 07/01/01 Admit Date:  07/01/2001                             Operative Report  PREOPERATIVE DIAGNOSES: 1. Spondylolisthesis L5-S1. 2. Spondylolysis L5-S1. 3. Degenerative disc disease L5-S1. 4. Lumbar radiculopathy.  POSTOPERATIVE DIAGNOSES: 1. Spondylolisthesis L5-S1. 2. Spondylolysis L5-S1. 3. Degenerative disc disease L5-S1. 4. Lumbar radiculopathy.  OPERATION: 1. Posterior lateral arthrodesis L5-S1 with pedicle screw fixation    CD Horizon System, 6.5 x 40 mm screws. 2. Gill procedure for decompression. 3. Local autograft and Allograft.  SURGEON:  Kyle L. Franky Macho, M.D.  ASSISTANT:  Stefani Dama, M.D.  ANESTHESIA:  General endotracheal.  COMPLICATIONS:  None.  INDICATIONS:  [name] [name] is a 77 year old woman who presents with severe lower extremity pain secondary to spondylolisthesis and subsequent foraminal compression of the L5 nerve roots bilaterally. It had been recommended to her and she has agreed now to undergo lumbar fusion and Gill procedure.  DESCRIPTION OF PROCEDURE:  [name] [name] is brought to the operating room, intubated and placed under general anesthesia without difficulty.  She was rolled prone onto the shoulder rolls, assuring all pressure points were properly padded.  Her back was prepped and she was draped in a sterile fashion. I then made my skin incision with a #10 blade and took this down to the thoracolumbar fascia.  Using monopolar cautery, I exposed the lamina first L5.  I took an x-ray after placing double endoganglia knife inferior to the lamina of L5.  X-ray showed I was at the correct position.  I then exposed the lamina of L4, L5 and S1.  After placing self retaining retractors,  I then worked on removing the lamina of L5 for my Gill procedure. I was able to curet the facet and removed and severed the ligamentous attachments and then I removed that piece of bone which was obviously posterior to the pars defect. I then worked on identifying the transverse process of L5 on the left side which was done.  After doing that, I decompressed the L5 nerve root on the left side and then subsequently decompressed the L5 nerve root on the right side with very generous foraminotomies.  I then knew I was going to be fusing the patient.  Secondary to the severe slip, I did not believe at that point that I would be able to get in an interbody so that was abandoned.  At this time, Dr. Danielle Dess came in and assisted me in placing pedicle screws.  First on the right side, he identified the pedicles with bone probes.  We used fluoroscopic guidance, showed the probes to be in good position.  He then tapped them in place, 6.5 x 40 mm CD horizon pedicles screws, one into the sacrum and one into L5.  I then proceeded to first identify the pedicles on the left side.  Then I tapped in place screws after exploring each hole for cortical breeches.  This was also done on the right side.  The screws were placed without difficulty.  I then placed a connecting rod and secured that with the top screw.  There was a slight amount of distraction performed. Those  screws were then tightened into position.  The bone that had been removed was cleaned and macerated and I mixed that with bone and placed this in the lateral gutters over the transverse processes and sacral ala after those had been decorticated by drill.  I then closed the wound in a layered fashion ensuring that there was good hemostasis.  The thoracolumbar fascia was reapproximated, then the subcutaneous tissues.  Finally the most superficial layer and I used Dermabond as a sterile dressing.  The patient tolerated the procedure well.  She was  awake and moving all extremities. Dictated by:   Mena Goes. Franky Macho, M.D. Attending Physician:  Coletta Memos DD:  07/01/01 TD:  07/03/01 Job: [id number]

## 2011-01-11 NOTE — Discharge Summary (Signed)
[place]. [facility]  Patient:    [name], [name] Visit Number: [id number] MRN: [id number]          Service Type: SUR Location: 3000 3033 01 Attending Physician:  Coletta Memos Admit Date:  07/01/2001 Discharge Date: 07/05/2001                             Discharge Summary  ADMISSION HISTORY AND PHYSICAL EXAMINATION:  The patient is a 77 year old woman patient of Dr. Joesphine Bare, who had a spinal listhesis of L5 and S1.  She was admitted by Dr. Franky Macho for decompression and stabilization.  General examination was unremarkable.  Neurologic examination showed intact strength in the upper extremities.  She had some weakness in the left EHL.  Sensation was decreased to pinprick in the left S1 distribution.  HOSPITAL COURSE:  The patient was admitted by Dr. Franky Macho, underwent an L5-S1 posterolateral arthrodesis.  Postoperatively she has done fairly well and has made steady progress in increasing her ambulation and activity.  Her Foley catheter and PCA morphine pump were discontinued.  Her wound is healing nicely.  She is asking to be discharged to home.  She has been given instructions regarding wound care and activity.  She is follow up with Dr. Franky Macho in three weeks and have AP and lateral lumbar spine x-rays done.  DISCHARGE MEDICATIONS: 1. Vicodin 1-2 tablets p.o. 4-6 hours p.r.n. pain #40 tablets prescribed, no    refills. 2. Ambien 10 mg q.h.s. p.r.n. insomnia, #15 tablets prescribed, no refills.  DISCHARGE DIAGNOSES:  Spinal listhesis. Attending Physician:  Coletta Memos DD:  07/05/01 TD:  07/06/01 Job: [id number]

## 2011-02-18 ENCOUNTER — Telehealth: Payer: Self-pay | Admitting: *Deleted

## 2011-02-18 NOTE — Telephone Encounter (Signed)
[name] [name] called stating that she had been extremely tired for the past two weeks. Wanted to see Dr.Katz Today. I explained to her that Dr.Katz was not in office today. I offered her an appt later in the week and she Said nothing. She actually said thank you and hung up.

## 2011-02-22 ENCOUNTER — Encounter: Payer: Self-pay | Admitting: Cardiology

## 2011-02-22 ENCOUNTER — Ambulatory Visit (INDEPENDENT_AMBULATORY_CARE_PROVIDER_SITE_OTHER): Payer: Medicare Other | Admitting: Cardiology

## 2011-02-22 DIAGNOSIS — I10 Essential (primary) hypertension: Secondary | ICD-10-CM

## 2011-02-22 DIAGNOSIS — R079 Chest pain, unspecified: Secondary | ICD-10-CM

## 2011-02-22 DIAGNOSIS — I4891 Unspecified atrial fibrillation: Secondary | ICD-10-CM

## 2011-02-22 DIAGNOSIS — R0602 Shortness of breath: Secondary | ICD-10-CM

## 2011-02-22 MED ORDER — METOPROLOL TARTRATE 25 MG PO TABS: ORAL_TABLET | ORAL | Status: DC

## 2011-02-22 NOTE — Assessment & Plan Note (Signed)
She is not having any significant chest pain. 

## 2011-02-22 NOTE — Assessment & Plan Note (Signed)
Her main symptoms today are fatigue and shortness of breath with exertion.  It is possible h with the rate is too slow.  She does think that it's possible that this became warmer problem after we raised her dose of metoprolol.  We'll cut her metoprolol back to 12.5 b.i.d.  He'll also plan to check a digoxin level and hold her digoxin.  Will then see her back for followup.  CBC and chemistry will be checked along with TSH. Chest x-ray will be done.

## 2011-02-22 NOTE — Assessment & Plan Note (Signed)
Blood pressure is reasonably controlled no change in therapy.

## 2011-02-22 NOTE — Patient Instructions (Signed)
Follow up as scheduled. Hold Digoxin until re-evaluated by Dr. Myrtis Ser. Decrease Metoprolol to 12.5 mg (1/2 of your 25 mg) two times a day. Your physician recommends that you go to the [facility] for lab work: CBC/BMET/TSH/DIGOXIN LEVEL-Do today. If the results of your test are normal or stable, you will receive a letter. If they are abnormal, the nurse will contact you by phone.

## 2011-02-22 NOTE — Assessment & Plan Note (Signed)
As outlined her atrial fibrillation rate today appears to be slow.  The dictation doses will be adjusted.  I decided to wait before doing any type of monitoring.

## 2011-02-22 NOTE — Progress Notes (Signed)
HPI The patient is seen as an add-on today or shortness of breath and fatigue.  I had seen her last in March, 2012. She has chronic atrial fib.  She has been doing well until approximately 2-3 weeks ago.  She now notices fatigue in general.  She also has shortness of breath and rarely with exertion.  She does not sense of rapid heart rate with walking.  She may feel this at times when resting.  She has not had syncope or presyncope.  There is no chest pain.  There is no PND or orthopnea. No Known Allergies  Current Outpatient Prescriptions  Medication Sig Dispense Refill  . acetaminophen (TYLENOL) 500 MG tablet Take 500 mg by mouth. As needed.       . Calcium Carbonate-Vitamin D (CALCIUM 600+D) 600-400 MG-UNIT per tablet Take 1 tablet by mouth 2 (two) times daily.        . digoxin (LANOXIN) 0.25 MG tablet Take 250 mcg by mouth daily.        [name] diltiazem (CARDIZEM CD) 180 MG 24 hr capsule Take one tab daily      . furosemide (LASIX) 20 MG tablet Take 1 tablet by mouth daily.       . metoprolol tartrate (LOPRESSOR) 25 MG tablet Take 25 mg by mouth 2 (two) times daily.        . Multiple Vitamin (MULTIVITAMIN) tablet Take 1 tablet by mouth daily.        . simvastatin (ZOCOR) 20 MG tablet Take 20 mg by mouth at bedtime.        [name] warfarin (COUMADIN) 2 MG tablet Take daily by mouth as directed by the [facility] with Dr. Dimas Aguas.       . omeprazole (PRILOSEC OTC) 20 MG tablet Take 1 tablet by mouth daily as needed.       [name] DISCONTD: temazepam (RESTORIL) 15 MG capsule Take 15 mg by mouth at bedtime as needed.          History   Social History  . Marital Status: Widowed    Spouse Name: N/A    Number of Children: N/A  . Years of Education: N/A   Occupational History  . Not on file.   Social History Main Topics  . Smoking status: Never Smoker   . Smokeless tobacco: Not on file  . Alcohol Use: No  . Drug Use: No  . Sexually Active: Not on file   Other Topics Concern  . Not on file     Social History Narrative  . No narrative on file    Family History  Problem Relation Age of Onset  . Cancer Other   . Coronary artery disease Other     Past Medical History  Diagnosis Date  . Carotid stenosis     Dopplers 2009 better than past; Dopplers 09/07/2009 <50% bilateral-stable  . Hyperlipidemia   . Hypertension   . Dyspnea   . Atrial fibrillation     DCCV prior to 04/05/2009 but reverted A.Fib, consultation w/Dr.Klein, decision to use Propafenone and   cardiovert again..cardioversion June 08, 2009.[name] on 4 days Propafenone... 100 Joules... normal sinus rhythm... Cardizem stopped.. /  return to atrial fib.[name]   /   cardionet on cardizem...09/14/2009...rates  52-190.  On Coumadin  . GERD (gastroesophageal reflux disease)     Aspirin Stopped  . Chest pain     cardiac catheterization.. 2009... no significant CAD.[name] / recurrent chest pain 2010.. consider; EF 65% by Echo 04/2009  .  Aortic valve sclerosis     Echo  04/2009  EF 65%.no stenosis.... echo... September, 2010  . Anxiety     May play a role with some symptoms  . Visual changes     Prior planned to have ophthalmology evlaution  . Drug therapy     coumadin  . Abnormal EKG     low voltage (no amyloid)  . Shortness of breath     Shortness of breath and fatigue June, 2012    Past Surgical History  Procedure Date  . Back surgery     X 2, most recent 2009  . Cataract extraction, bilateral     ROS  Patient denies fever, chills, headache, sweats, rash, change in vision, change in hearing, chest pain, cough, nausea vomiting, urinary symptoms.  All of the systems are reviewed and are negative.  PHYSICAL EXAM Patient is stable today.  She is oriented to person time and place.  Affect is normal.  It is atraumatic.  There is no jugular venous distention.  Lungs are clear.  Respiratory effort is nonlabored.  Cardiac exam reveals S1 and S2.  The rhythm is irregularly irregular.  Rate is slow.  Abdomen soft.  There is no  peripheral edema.  There no musculoskeletal deformities.  There are no skin rashes. Filed Vitals:   02/22/11 1429 02/22/11 1437  BP: 147/71 125/65  Pulse: 55 66  Height: 5' (1.524 m)   Weight: 108 lb 12.8 oz (49.351 kg)   SpO2: 97%     EKG EKG is done today and reviewed by me.  She has atrial fibrillation low rate.  She has old decreased voltage.  There is no significant change. ASSESSMENT & PLAN

## 2011-03-08 ENCOUNTER — Ambulatory Visit: Payer: Medicare Other | Admitting: Cardiology

## 2011-04-01 ENCOUNTER — Encounter: Payer: Self-pay | Admitting: Cardiology

## 2011-04-01 ENCOUNTER — Ambulatory Visit (INDEPENDENT_AMBULATORY_CARE_PROVIDER_SITE_OTHER): Payer: Medicare Other | Admitting: Cardiology

## 2011-04-01 DIAGNOSIS — R0602 Shortness of breath: Secondary | ICD-10-CM

## 2011-04-01 DIAGNOSIS — I4891 Unspecified atrial fibrillation: Secondary | ICD-10-CM

## 2011-04-01 NOTE — Assessment & Plan Note (Signed)
She is feeling better.  No further workup.  I believe her shortness of breath may have been related to the generalized fatigue she had before

## 2011-04-01 NOTE — Patient Instructions (Signed)
Continue all current medications. Your physician wants you to follow up in: 6 months.  You will receive a reminder letter in the mail one-two months in advance.  If you don't receive a letter, please call our office to schedule the follow up appointment   

## 2011-04-01 NOTE — Assessment & Plan Note (Signed)
Atrial fibrillation rate appears controlled at this time.  Clinically she feels much better off digoxin.  She also feels better on the lower dose of beta blocker.  Her digoxin level had been 1.8.  This was on 0.25 mg daily.  In the future if we needed we could try a lower dose.  For now she is doing very well and no further changes will be made.

## 2011-04-01 NOTE — Progress Notes (Signed)
HPI Patient is seen for followup of atrial fibrillation.  I saw her last February 22, 2011.  She had some fatigue area she has chronic atrial fibrillation.  Her atrial fibrillation her to be slow.  Digoxin level was checked at 1.8.  I decided to stop her digoxin and decrease her metoprolol.  CBC revealed a good hemoglobin.  Chemistries were normal potassium was normal.  Her TSH is borderline low at 0.35.  She feels much better on the medication changes.  She is fully active. No Known Allergies  Current Outpatient Prescriptions  Medication Sig Dispense Refill  . acetaminophen (TYLENOL) 500 MG tablet Take 500 mg by mouth. As needed.       . Calcium Carbonate-Vitamin D (CALCIUM 600+D) 600-400 MG-UNIT per tablet Take 1 tablet by mouth 2 (two) times daily.        [name] diltiazem (CARDIZEM CD) 180 MG 24 hr capsule Take 180 mg by mouth daily.       . furosemide (LASIX) 20 MG tablet Take 1 tablet by mouth daily.       . metoprolol tartrate (LOPRESSOR) 25 MG tablet Take 1/2 tablet (12.5 mg) by mouth two times a day..      . Multiple Vitamin (MULTIVITAMIN) tablet Take 1 tablet by mouth daily.        [name] omeprazole (PRILOSEC OTC) 20 MG tablet Take 1 tablet by mouth daily as needed.       . simvastatin (ZOCOR) 20 MG tablet Take 20 mg by mouth at bedtime.        [name] warfarin (COUMADIN) 2 MG tablet Take daily by mouth as directed by the [facility] with Dr. Dimas Aguas.         History   Social History  . Marital Status: Widowed    Spouse Name: N/A    Number of Children: N/A  . Years of Education: N/A   Occupational History  . Not on file.   Social History Main Topics  . Smoking status: Never Smoker   . Smokeless tobacco: Never Used  . Alcohol Use: No  . Drug Use: No  . Sexually Active: Not on file   Other Topics Concern  . Not on file   Social History Narrative  . No narrative on file    Family History  Problem Relation Age of Onset  . Cancer Other   . Coronary artery disease Other      Past Medical History  Diagnosis Date  . Carotid stenosis     Dopplers 2009 better than past; Dopplers 09/07/2009 <50% bilateral-stable  . Hyperlipidemia   . Hypertension   . Dyspnea   . Atrial fibrillation     DCCV prior to 04/05/2009 but reverted A.Fib, consultation w/Dr.Klein, decision to use Propafenone and   cardiovert again..cardioversion June 08, 2009.[name] on 4 days Propafenone... 100 Joules... normal sinus rhythm... Cardizem stopped.. /  return to atrial fib.[name]   /   cardionet on cardizem...09/14/2009...rates  52-190.  On Coumadin  . GERD (gastroesophageal reflux disease)     Aspirin Stopped  . Chest pain     cardiac catheterization.. 2009... no significant CAD.[name] / recurrent chest pain 2010.. consider; EF 65% by Echo 04/2009  . Aortic valve sclerosis     Echo  04/2009  EF 65%.no stenosis.... echo... September, 2010  . Anxiety     May play a role with some symptoms  . Visual changes     Prior planned to have ophthalmology evlaution  . Drug therapy  coumadin  . Abnormal EKG     low voltage (no amyloid)  . Shortness of breath     Shortness of breath and fatigue June, 2012    Past Surgical History  Procedure Date  . Back surgery     X 2, most recent 2009  . Cataract extraction, bilateral     ROS  Patient denies fever, chills, headache, sweats, rash, change in vision, change in hearing, chest pain, cough, nausea vomiting, urinary symptoms.  All other systems are reviewed and are negative  PHYSICAL EXAM She is oriented to person time and place.  Affect is normal.  Head is atraumatic.  There is no jugular venous distention.  Lungs are clear.  Respiratory effort is nonlabored.  Cardiac exam reveals an irregularly irregular rhythm.  There is a soft systolic murmur.  The abdomen is soft there is no peripheral edema. Filed Vitals:   04/01/11 1255  BP: 114/86  Pulse: 72  Height: 5' (1.524 m)  Weight: 112 lb (50.803 kg)  SpO2: 97%    EKG Is not done today  ASSESSMENT &  PLAN

## 2011-05-22 LAB — BASIC METABOLIC PANEL
BUN: 22
CO2: 27
Calcium: 8.9
Chloride: 102
Creatinine, Ser: 0.94
GFR calc Af Amer: >60
GFR calc non Af Amer: 57 — ABNORMAL LOW
Glucose, Bld: 110 — ABNORMAL HIGH
Potassium: 3.8
Sodium: 140

## 2011-05-22 LAB — CBC
HCT: 30.9 — ABNORMAL LOW
Hemoglobin: 10.7 — ABNORMAL LOW
MCHC: 34.6
MCV: 89
Platelets: 246
RBC: 3.47 — ABNORMAL LOW
RDW: 12.3
WBC: 4.7

## 2011-05-22 LAB — LIPID PANEL
Cholesterol: 195
HDL: 82
LDL Cholesterol: 103 — ABNORMAL HIGH
Total CHOL/HDL Ratio: 2.4
Triglycerides: 52
VLDL: 10

## 2011-05-22 LAB — HEPARIN LEVEL (UNFRACTIONATED)
Heparin Unfractionated: 0.74 — ABNORMAL HIGH
Heparin Unfractionated: 0.99 — ABNORMAL HIGH

## 2011-09-02 DIAGNOSIS — I4891 Unspecified atrial fibrillation: Secondary | ICD-10-CM | POA: Diagnosis not present

## 2011-09-16 DIAGNOSIS — H353190 Nonexudative age-related macular degeneration, unspecified eye, stage unspecified: Secondary | ICD-10-CM | POA: Diagnosis not present

## 2011-09-16 DIAGNOSIS — I4891 Unspecified atrial fibrillation: Secondary | ICD-10-CM | POA: Diagnosis not present

## 2011-09-19 DIAGNOSIS — Q762 Congenital spondylolisthesis: Secondary | ICD-10-CM | POA: Diagnosis not present

## 2011-10-01 DIAGNOSIS — R5383 Other fatigue: Secondary | ICD-10-CM | POA: Diagnosis not present

## 2011-10-01 DIAGNOSIS — E7800 Pure hypercholesterolemia, unspecified: Secondary | ICD-10-CM | POA: Diagnosis not present

## 2011-10-01 DIAGNOSIS — I10 Essential (primary) hypertension: Secondary | ICD-10-CM | POA: Diagnosis not present

## 2011-10-01 DIAGNOSIS — I472 Ventricular tachycardia: Secondary | ICD-10-CM | POA: Diagnosis not present

## 2011-10-01 DIAGNOSIS — R5381 Other malaise: Secondary | ICD-10-CM | POA: Diagnosis not present

## 2011-10-03 ENCOUNTER — Ambulatory Visit (INDEPENDENT_AMBULATORY_CARE_PROVIDER_SITE_OTHER): Payer: Medicare Other | Admitting: Cardiology

## 2011-10-03 ENCOUNTER — Encounter: Payer: Self-pay | Admitting: Cardiology

## 2011-10-03 DIAGNOSIS — R079 Chest pain, unspecified: Secondary | ICD-10-CM

## 2011-10-03 DIAGNOSIS — M549 Dorsalgia, unspecified: Secondary | ICD-10-CM | POA: Insufficient documentation

## 2011-10-03 DIAGNOSIS — I4891 Unspecified atrial fibrillation: Secondary | ICD-10-CM

## 2011-10-03 NOTE — Progress Notes (Signed)
HPI Patient is seen today to followup atrial fibrillation. She is active and she is not feeling any significant palpitations. She's having some pain in her back. Her cardiac status is stable. No Known Allergies  Current Outpatient Prescriptions  Medication Sig Dispense Refill  . acetaminophen (TYLENOL) 500 MG tablet Take 500 mg by mouth. As needed.       . Calcium Carbonate-Vitamin D (CALCIUM 600+D) 600-400 MG-UNIT per tablet Take 1 tablet by mouth 2 (two) times daily.        [name] diltiazem (CARDIZEM CD) 180 MG 24 hr capsule Take 180 mg by mouth daily.       . furosemide (LASIX) 20 MG tablet Take 1 tablet by mouth daily.       . metoprolol tartrate (LOPRESSOR) 25 MG tablet Take 1/2 tablet (12.5 mg) by mouth two times a day..      . Multiple Vitamin (MULTIVITAMIN) tablet Take 1 tablet by mouth daily.        . simvastatin (ZOCOR) 20 MG tablet Take 20 mg by mouth at bedtime.        [name] warfarin (COUMADIN) 2 MG tablet Take daily by mouth as directed by the [facility] with Dr. Dimas Aguas.         History   Social History  . Marital Status: Widowed    Spouse Name: N/A    Number of Children: N/A  . Years of Education: N/A   Occupational History  . Not on file.   Social History Main Topics  . Smoking status: Never Smoker   . Smokeless tobacco: Never Used  . Alcohol Use: No  . Drug Use: No  . Sexually Active: Not on file   Other Topics Concern  . Not on file   Social History Narrative  . No narrative on file    Family History  Problem Relation Age of Onset  . Cancer Other   . Coronary artery disease Other     Past Medical History  Diagnosis Date  . Carotid stenosis     Dopplers 2009 better than past; Dopplers 09/07/2009 <50% bilateral-stable  . Hyperlipidemia   . Hypertension   . Dyspnea   . Atrial fibrillation     DCCV prior to 04/05/2009 but reverted A.Fib, consultation w/Dr.Klein, decision to use Propafenone and   cardiovert again..cardioversion June 08, 2009.[name] on  4 days Propafenone... 100 Joules... normal sinus rhythm... Cardizem stopped.. /  return to atrial fib.[name]   /   cardionet on cardizem...09/14/2009...rates  52-190.  On Coumadin  . GERD (gastroesophageal reflux disease)     Aspirin Stopped  . Chest pain     cardiac catheterization.. 2009... no significant CAD.[name] / recurrent chest pain 2010.. consider; EF 65% by Echo 04/2009  . Aortic valve sclerosis     Echo  04/2009  EF 65%.no stenosis.... echo... September, 2010  . Anxiety     May play a role with some symptoms  . Visual changes     Prior planned to have ophthalmology evlaution  . Drug therapy     coumadin  . Abnormal EKG     low voltage (no amyloid)  . Shortness of breath     Shortness of breath and fatigue June, 2012    Past Surgical History  Procedure Date  . Back surgery     X 2, most recent 2009  . Cataract extraction, bilateral     ROS  Patient denies fever, chills, headache, sweats, rash, change in vision, change in hearing,  chest pain, cough, nausea vomiting, urinary symptoms. All other systems are reviewed and are negative.  PHYSICAL EXAM Patient looks good today. She is oriented to person time and place. Affect is normal. There is no jugulovenous distention. Her rhythm is irregularly irregular. Lungs are clear. Respiratory effort is nonlabored. Cardiac exam reveals S1 and S2. There no clicks or significant murmurs. There is no peripheral edema. The abdomen is soft. Filed Vitals:   10/03/11 1035  BP: 125/85  Pulse: 61  Height: 5' (1.524 m)  Weight: 119 lb (53.978 kg)    EKG is done today and reviewed by me. There is atrial fibrillation. The rate is approximately 85. There are nonspecific ST-T wave changes. There is no significant change.  ASSESSMENT & PLAN

## 2011-10-03 NOTE — Assessment & Plan Note (Signed)
The patient will be following up with Dr. Franky Macho, her neurosurgeon. She asked me if she would be a candidate for any type of surgery from the cardiac viewpoint. She has normal LV function and no significant coronary disease. Her only cardiac issue is atrial fibrillation. She can be cleared from the cardiac viewpoint for procedure if she decides that this is appropriate for her.

## 2011-10-03 NOTE — Assessment & Plan Note (Signed)
She's not had any recurrent chest pain. No further workup. We know that her coronaries look quite good in 2009.

## 2011-10-03 NOTE — Assessment & Plan Note (Signed)
Atrial fibrillation is controlled. I had noted before that we could use a small dose of digoxin for better rate control if we needed. No change at this time.

## 2011-10-03 NOTE — Patient Instructions (Signed)
Your physician recommends that you schedule a follow-up appointment in: 6 months. You will receive a reminder letter in the mail 1-2 months in advance reminding you to call and schedule your appointment. If you don't receive your letter, please contact our office.  Your physician recommends that you continue on your current medications as directed. Please refer to the Current Medication list given to you today.

## 2011-10-08 DIAGNOSIS — M5430 Sciatica, unspecified side: Secondary | ICD-10-CM | POA: Diagnosis not present

## 2011-10-08 DIAGNOSIS — I10 Essential (primary) hypertension: Secondary | ICD-10-CM | POA: Diagnosis not present

## 2011-10-17 DIAGNOSIS — I4891 Unspecified atrial fibrillation: Secondary | ICD-10-CM | POA: Diagnosis not present

## 2011-11-05 DIAGNOSIS — Q762 Congenital spondylolisthesis: Secondary | ICD-10-CM | POA: Diagnosis not present

## 2011-11-06 ENCOUNTER — Other Ambulatory Visit: Payer: Self-pay | Admitting: Neurosurgery

## 2011-11-07 ENCOUNTER — Telehealth: Payer: Self-pay | Admitting: Cardiology

## 2011-11-07 NOTE — Telephone Encounter (Signed)
New Msg: Pt calling wanting to speak with nurse/MD regarding clarification pt instructions to stop taking coumadin until next Friday march 22nd to prepare for pt surgery. Please return pt call to discuss further.

## 2011-11-07 NOTE — Telephone Encounter (Signed)
Unable to reach nurse at home on day off, spoke with DOD Dr Daleen Squibb, pt to stop coumadin 5 days prior and go back on as soon as risk of bleeding has past. Last dose will be 3/17 Sunday, pt takes med EOD 2 mg. Told to follow up with Dr Dimas Aguas for pt/inr as soon as able, told her she will probably start her med that evening after surgery or the next day or when surgeon advises. Pt agreed to plan.

## 2011-11-07 NOTE — Telephone Encounter (Signed)
Called pt and she was called yesterday evening by Dr Myrtis Ser staff and told to stop coumadin for upcoming surgery, last dose was to be last evening 3/13 for a surgery 3/22, pt feels this is a long time to be off med. Dr Dimas Aguas is dosing med but i called and they had deferred dosing back to Dr Myrtis Ser. I will contact Dr Myrtis Ser nurse for further information.

## 2011-11-12 ENCOUNTER — Encounter (HOSPITAL_COMMUNITY): Payer: Self-pay

## 2011-11-12 ENCOUNTER — Encounter (HOSPITAL_COMMUNITY)
Admission: RE | Admit: 2011-11-12 | Discharge: 2011-11-12 | Disposition: A | Discharge status: Home / Self Care | Payer: Medicare Other | Source: Ambulatory Visit | Attending: Neurosurgery | Admitting: Neurosurgery

## 2011-11-12 ENCOUNTER — Encounter (HOSPITAL_COMMUNITY)
Admission: RE | Admit: 2011-11-12 | Discharge: 2011-11-12 | Disposition: A | Discharge status: Home / Self Care | Payer: Medicare Other | Source: Ambulatory Visit | Attending: Anesthesiology | Admitting: Anesthesiology

## 2011-11-12 DIAGNOSIS — I4891 Unspecified atrial fibrillation: Secondary | ICD-10-CM | POA: Diagnosis not present

## 2011-11-12 DIAGNOSIS — Z01811 Encounter for preprocedural respiratory examination: Secondary | ICD-10-CM | POA: Diagnosis not present

## 2011-11-12 DIAGNOSIS — R079 Chest pain, unspecified: Secondary | ICD-10-CM | POA: Diagnosis not present

## 2011-11-12 HISTORY — DX: Unspecified osteoarthritis, unspecified site: M19.90

## 2011-11-12 LAB — CBC
HCT: 40.5 % (ref 36.0–46.0)
Hemoglobin: 13.9 g/dL (ref 12.0–15.0)
MCH: 32.2 pg (ref 26.0–34.0)
MCHC: 34.3 g/dL (ref 30.0–36.0)
MCV: 93.8 fL (ref 78.0–100.0)
Platelets: 207 10*3/uL (ref 150–400)
RBC: 4.32 MIL/uL (ref 3.87–5.11)
RDW: 12.2 % (ref 11.5–15.5)
WBC: 6.2 10*3/uL (ref 4.0–10.5)

## 2011-11-12 LAB — BASIC METABOLIC PANEL
BUN: 14 mg/dL (ref 6–23)
CO2: 29 mEq/L (ref 19–32)
Calcium: 9.3 mg/dL (ref 8.4–10.5)
Chloride: 98 mEq/L (ref 96–112)
Creatinine, Ser: 0.82 mg/dL (ref 0.50–1.10)
GFR calc Af Amer: 74 mL/min — ABNORMAL LOW (ref >90)
GFR calc non Af Amer: 64 mL/min — ABNORMAL LOW (ref >90)
Glucose, Bld: 95 mg/dL (ref 70–99)
Potassium: 3.9 mEq/L (ref 3.5–5.1)
Sodium: 138 mEq/L (ref 135–145)

## 2011-11-12 LAB — SURGICAL PCR SCREEN
MRSA, PCR: NEGATIVE
Staphylococcus aureus: NEGATIVE

## 2011-11-12 LAB — PROTIME-INR
INR: 2.2 — ABNORMAL HIGH (ref 0.00–1.49)
Prothrombin Time: 24.8 seconds — ABNORMAL HIGH (ref 11.6–15.2)

## 2011-11-12 LAB — APTT: aPTT: 39 seconds — ABNORMAL HIGH (ref 24–37)

## 2011-11-12 NOTE — Pre-Procedure Instructions (Addendum)
20 [name] [name]  11/12/2011   Your procedure is scheduled on: 3.22.13  Report to [facility] at 730* AM.  Call this number if you have problems the morning of surgery: [phone]   Remember:   Do not eat food:After Midnight.  May have clear liquids: up to 4 Hours before arrival. 330am   Clear liquids include soda, tea, black coffee, apple or grape juice, broth.  Take these medicines the morning of surgery with A SIP OF WATER: tylenol, cardizem,STOP  , coumadin, multi   Do not wear jewelry, make-up or nail polish.  Do not wear lotions, powders, or perfumes. You may wear deodorant.  Do not shave 48 hours prior to surgery.  Do not bring valuables to the hospital.  Contacts, dentures or bridgework may not be worn into surgery.  Leave suitcase in the car. After surgery it may be brought to your room.  For patients admitted to the hospital, checkout time is 11:00 AM the day of discharge.   Patients discharged the day of surgery will not be allowed to drive home.  Name and phone number of your driver: patsy [phone]  Special Instructions: CHG Shower Use Special Wash: 1/2 bottle night before surgery and 1/2 bottle morning of surgery.   Please read over the following fact sheets that you were given: Pain Booklet, Coughing and Deep Breathing, MRSA Information and Surgical Site Infection Prevention

## 2011-11-12 NOTE — Progress Notes (Signed)
Note on chart and epic from dr Myrtis Ser 2.7.13

## 2011-11-13 NOTE — Consult Note (Signed)
Anesthesia:  Patient is a 78 year old female scheduled for a L4-5 decompression with posterolateral arthrodesis and instrumentation on 11/15/11.  History includes afib on Coumadin, HLD, non-smoker, anxiety, SOB, GERD, HTN, AV sclerosis without stenosis (04/2009), carotid stenosis (<50% bilateral 08/2009).  She also has a history of CP with a normal coronaries in 2009.  Her Cardiologist is Dr. Myrtis Ser who saw her last on 10/03/11.  EKG then showed afib at 91 bpm, right axis deviation, low voltage QRS, septal infarct.  He cleared her for this procedure at that time.  She had a cath on 01/14/08 (copy found in old chart, but not seen in Epic) and showed normal coronaries and LV wall motion, EF 60%.  Echo done at [facility] (under Results Review, Others dated 05/15/09) showed: mild LVH, normal LV systolic function, EF > 65%, LA mildly dilated, moderate MR, mild aortic leaflet calcification, non AS or AR, RV mildly dilated with normal systolic function, RA mildly dilated, mild TR, RV systolic pressure calculated at 33 mmHg.  CXR on 11/12/11 showed: Moderate cardiac silhouette enlargement which appears larger than on prior study. Generalized hyperinflation configuration. Pectus excavatum. No superimposed pulmonary edema, pneumonia, or pleural effusion.   Labs noted.  She will need a PT/PTT on arrival.  Her Coumadin was held starting 11/10/11.    Plan to proceed if repeat coags reasonable.

## 2011-11-14 DIAGNOSIS — I4891 Unspecified atrial fibrillation: Secondary | ICD-10-CM | POA: Diagnosis not present

## 2011-11-14 MED ORDER — CEFAZOLIN SODIUM 1-5 GM-% IV SOLN
1.0000 g | INTRAVENOUS | Status: AC
  Administered 2011-11-15: 1 g via INTRAVENOUS
  Filled 2011-11-14: qty 50

## 2011-11-15 ENCOUNTER — Inpatient Hospital Stay (HOSPITAL_COMMUNITY): Payer: Medicare Other

## 2011-11-15 ENCOUNTER — Encounter (HOSPITAL_COMMUNITY)
Admission: RE | Disposition: A | Discharge status: Skilled Nursing Facility | Payer: Self-pay | Source: Ambulatory Visit | Attending: Neurosurgery

## 2011-11-15 ENCOUNTER — Encounter (HOSPITAL_COMMUNITY): Payer: Self-pay | Admitting: Vascular Surgery

## 2011-11-15 ENCOUNTER — Inpatient Hospital Stay (HOSPITAL_COMMUNITY)
Admission: RE | Admit: 2011-11-15 | Discharge: 2011-11-20 | DRG: 460 | Disposition: A | Discharge status: Skilled Nursing Facility | Payer: Medicare Other | Source: Ambulatory Visit | Attending: Neurosurgery | Admitting: Neurosurgery

## 2011-11-15 ENCOUNTER — Encounter (HOSPITAL_COMMUNITY): Payer: Self-pay | Admitting: Surgery

## 2011-11-15 ENCOUNTER — Inpatient Hospital Stay (HOSPITAL_COMMUNITY): Payer: Medicare Other | Admitting: Vascular Surgery

## 2011-11-15 DIAGNOSIS — M519 Unspecified thoracic, thoracolumbar and lumbosacral intervertebral disc disorder: Secondary | ICD-10-CM | POA: Diagnosis not present

## 2011-11-15 DIAGNOSIS — Z01812 Encounter for preprocedural laboratory examination: Secondary | ICD-10-CM | POA: Diagnosis not present

## 2011-11-15 DIAGNOSIS — M48062 Spinal stenosis, lumbar region with neurogenic claudication: Secondary | ICD-10-CM | POA: Diagnosis not present

## 2011-11-15 DIAGNOSIS — M4310 Spondylolisthesis, site unspecified: Secondary | ICD-10-CM | POA: Diagnosis not present

## 2011-11-15 DIAGNOSIS — I4891 Unspecified atrial fibrillation: Secondary | ICD-10-CM | POA: Diagnosis present

## 2011-11-15 DIAGNOSIS — I10 Essential (primary) hypertension: Secondary | ICD-10-CM | POA: Diagnosis present

## 2011-11-15 DIAGNOSIS — M47817 Spondylosis without myelopathy or radiculopathy, lumbosacral region: Secondary | ICD-10-CM | POA: Diagnosis not present

## 2011-11-15 DIAGNOSIS — M48061 Spinal stenosis, lumbar region without neurogenic claudication: Secondary | ICD-10-CM | POA: Diagnosis not present

## 2011-11-15 DIAGNOSIS — Z79899 Other long term (current) drug therapy: Secondary | ICD-10-CM | POA: Diagnosis not present

## 2011-11-15 DIAGNOSIS — Z7901 Long term (current) use of anticoagulants: Secondary | ICD-10-CM | POA: Diagnosis not present

## 2011-11-15 DIAGNOSIS — E785 Hyperlipidemia, unspecified: Secondary | ICD-10-CM | POA: Diagnosis not present

## 2011-11-15 DIAGNOSIS — M129 Arthropathy, unspecified: Secondary | ICD-10-CM | POA: Diagnosis present

## 2011-11-15 DIAGNOSIS — F411 Generalized anxiety disorder: Secondary | ICD-10-CM | POA: Diagnosis not present

## 2011-11-15 DIAGNOSIS — Z4789 Encounter for other orthopedic aftercare: Secondary | ICD-10-CM | POA: Diagnosis not present

## 2011-11-15 DIAGNOSIS — Z01818 Encounter for other preprocedural examination: Secondary | ICD-10-CM | POA: Diagnosis not present

## 2011-11-15 DIAGNOSIS — M7130 Other bursal cyst, unspecified site: Secondary | ICD-10-CM | POA: Diagnosis not present

## 2011-11-15 DIAGNOSIS — M545 Low back pain, unspecified: Secondary | ICD-10-CM | POA: Diagnosis not present

## 2011-11-15 DIAGNOSIS — I2510 Atherosclerotic heart disease of native coronary artery without angina pectoris: Secondary | ICD-10-CM | POA: Diagnosis not present

## 2011-11-15 DIAGNOSIS — Z5189 Encounter for other specified aftercare: Secondary | ICD-10-CM | POA: Diagnosis not present

## 2011-11-15 DIAGNOSIS — M5126 Other intervertebral disc displacement, lumbar region: Secondary | ICD-10-CM | POA: Diagnosis not present

## 2011-11-15 DIAGNOSIS — Q762 Congenital spondylolisthesis: Secondary | ICD-10-CM | POA: Diagnosis not present

## 2011-11-15 DIAGNOSIS — I359 Nonrheumatic aortic valve disorder, unspecified: Secondary | ICD-10-CM | POA: Diagnosis not present

## 2011-11-15 DIAGNOSIS — K219 Gastro-esophageal reflux disease without esophagitis: Secondary | ICD-10-CM | POA: Diagnosis not present

## 2011-11-15 LAB — PROTIME-INR
INR: 1.42 (ref 0.00–1.49)
Prothrombin Time: 17.6 seconds — ABNORMAL HIGH (ref 11.6–15.2)

## 2011-11-15 LAB — APTT: aPTT: 33 seconds (ref 24–37)

## 2011-11-15 SURGERY — POSTERIOR LUMBAR FUSION 1 LEVEL
Anesthesia: General | Wound class: Clean

## 2011-11-15 MED ORDER — FUROSEMIDE 20 MG PO TABS
20.0000 mg | ORAL_TABLET | Freq: Every day | ORAL | Status: DC
  Administered 2011-11-16 – 2011-11-20 (×5): 20 mg via ORAL
  Filled 2011-11-15 (×5): qty 1

## 2011-11-15 MED ORDER — OXYCODONE-ACETAMINOPHEN 5-325 MG PO TABS
1.0000 | ORAL_TABLET | ORAL | Status: DC | PRN
  Administered 2011-11-16: 1 via ORAL
  Administered 2011-11-16: 2 via ORAL
  Administered 2011-11-16: 1 via ORAL
  Administered 2011-11-16 – 2011-11-18 (×7): 2 via ORAL
  Administered 2011-11-18: 1 via ORAL
  Administered 2011-11-19: 2 via ORAL
  Filled 2011-11-15 (×3): qty 2
  Filled 2011-11-15: qty 1
  Filled 2011-11-15 (×8): qty 2

## 2011-11-15 MED ORDER — POTASSIUM CHLORIDE IN NACL 20-0.9 MEQ/L-% IV SOLN
INTRAVENOUS | Status: DC
  Administered 2011-11-16 – 2011-11-17 (×3): via INTRAVENOUS
  Filled 2011-11-15 (×6): qty 1000

## 2011-11-15 MED ORDER — GLYCOPYRROLATE 0.2 MG/ML IJ SOLN
INTRAMUSCULAR | Status: DC | PRN
  Administered 2011-11-15: .8 mg via INTRAVENOUS

## 2011-11-15 MED ORDER — SODIUM CHLORIDE 0.9 % IJ SOLN
3.0000 mL | Freq: Two times a day (BID) | INTRAMUSCULAR | Status: DC
  Administered 2011-11-16 – 2011-11-20 (×6): 3 mL via INTRAVENOUS

## 2011-11-15 MED ORDER — NALOXONE HCL 0.4 MG/ML IJ SOLN: 0.4000 mg | INTRAMUSCULAR | Status: DC | PRN

## 2011-11-15 MED ORDER — SODIUM CHLORIDE 0.9 % IJ SOLN: 9.0000 mL | INTRAMUSCULAR | Status: DC | PRN

## 2011-11-15 MED ORDER — THROMBIN 20000 UNITS EX KIT
PACK | CUTANEOUS | Status: DC | PRN
  Administered 2011-11-15: 11:00:00 via TOPICAL

## 2011-11-15 MED ORDER — PHENOL 1.4 % MT LIQD: 1.0000 | OROMUCOSAL | Status: DC | PRN

## 2011-11-15 MED ORDER — CEFAZOLIN SODIUM 1-5 GM-% IV SOLN
1.0000 g | Freq: Three times a day (TID) | INTRAVENOUS | Status: AC
  Administered 2011-11-15 – 2011-11-16 (×2): 1 g via INTRAVENOUS
  Filled 2011-11-15 (×2): qty 50

## 2011-11-15 MED ORDER — METOPROLOL TARTRATE 25 MG PO TABS
25.0000 mg | ORAL_TABLET | Freq: Two times a day (BID) | ORAL | Status: DC
  Administered 2011-11-15: 25 mg via ORAL
  Administered 2011-11-16: 12.5 mg via ORAL
  Filled 2011-11-15 (×3): qty 1

## 2011-11-15 MED ORDER — PHENYLEPHRINE HCL 10 MG/ML IJ SOLN
INTRAMUSCULAR | Status: DC | PRN
  Administered 2011-11-15: 80 ug via INTRAVENOUS

## 2011-11-15 MED ORDER — MORPHINE SULFATE 4 MG/ML IJ SOLN
1.0000 mg | INTRAMUSCULAR | Status: DC | PRN
  Administered 2011-11-16 – 2011-11-17 (×4): 2 mg via INTRAVENOUS
  Filled 2011-11-15 (×4): qty 1

## 2011-11-15 MED ORDER — ONDANSETRON HCL 4 MG/2ML IJ SOLN: 4.0000 mg | Freq: Four times a day (QID) | INTRAMUSCULAR | Status: DC | PRN

## 2011-11-15 MED ORDER — 0.9 % SODIUM CHLORIDE (POUR BTL) OPTIME
TOPICAL | Status: DC | PRN
  Administered 2011-11-15: 1000 mL

## 2011-11-15 MED ORDER — ALBUMIN HUMAN 5 % IV SOLN
INTRAVENOUS | Status: DC | PRN
  Administered 2011-11-15: 13:00:00 via INTRAVENOUS

## 2011-11-15 MED ORDER — DIPHENHYDRAMINE HCL 12.5 MG/5ML PO ELIX: 12.5000 mg | ORAL_SOLUTION | Freq: Four times a day (QID) | ORAL | Status: DC | PRN

## 2011-11-15 MED ORDER — PHENYLEPHRINE HCL 10 MG/ML IJ SOLN
10.0000 mg | INTRAVENOUS | Status: DC | PRN
  Administered 2011-11-15: 10 ug/min via INTRAVENOUS

## 2011-11-15 MED ORDER — HYDROMORPHONE HCL PF 1 MG/ML IJ SOLN
INTRAMUSCULAR | Status: AC
  Filled 2011-11-15: qty 1

## 2011-11-15 MED ORDER — HEMOSTATIC AGENTS (NO CHARGE) OPTIME
TOPICAL | Status: DC | PRN
  Administered 2011-11-15: 1 via TOPICAL

## 2011-11-15 MED ORDER — SIMVASTATIN 20 MG PO TABS
20.0000 mg | ORAL_TABLET | Freq: Every day | ORAL | Status: DC
  Administered 2011-11-15: 20 mg via ORAL
  Filled 2011-11-15 (×2): qty 1

## 2011-11-15 MED ORDER — CYCLOBENZAPRINE HCL 10 MG PO TABS
10.0000 mg | ORAL_TABLET | Freq: Three times a day (TID) | ORAL | Status: DC | PRN
  Administered 2011-11-16 – 2011-11-19 (×5): 10 mg via ORAL
  Filled 2011-11-15 (×5): qty 1

## 2011-11-15 MED ORDER — MORPHINE SULFATE 2 MG/ML IJ SOLN: 0.0500 mg/kg | INTRAMUSCULAR | Status: DC | PRN

## 2011-11-15 MED ORDER — DILTIAZEM HCL ER COATED BEADS 180 MG PO CP24
180.0000 mg | ORAL_CAPSULE | Freq: Every day | ORAL | Status: DC
  Administered 2011-11-16 – 2011-11-20 (×5): 180 mg via ORAL
  Filled 2011-11-15 (×5): qty 1

## 2011-11-15 MED ORDER — LACTATED RINGERS IV SOLN
INTRAVENOUS | Status: DC | PRN
  Administered 2011-11-15 (×3): via INTRAVENOUS

## 2011-11-15 MED ORDER — ONDANSETRON HCL 4 MG/2ML IJ SOLN
INTRAMUSCULAR | Status: DC | PRN
  Administered 2011-11-15: 4 mg via INTRAVENOUS

## 2011-11-15 MED ORDER — LIDOCAINE HCL (CARDIAC) 20 MG/ML IV SOLN
INTRAVENOUS | Status: DC | PRN
  Administered 2011-11-15: 50 mg via INTRAVENOUS

## 2011-11-15 MED ORDER — PROPOFOL 10 MG/ML IV EMUL
INTRAVENOUS | Status: DC | PRN
  Administered 2011-11-15: 120 mg via INTRAVENOUS

## 2011-11-15 MED ORDER — HYDROMORPHONE HCL PF 1 MG/ML IJ SOLN
0.2500 mg | INTRAMUSCULAR | Status: DC | PRN
Start: 2011-11-15 — End: 2011-11-15

## 2011-11-15 MED ORDER — CALCIUM CARBONATE-VITAMIN D 500-200 MG-UNIT PO TABS
1.0000 | ORAL_TABLET | Freq: Two times a day (BID) | ORAL | Status: DC
  Administered 2011-11-15 – 2011-11-20 (×10): 1 via ORAL
  Filled 2011-11-15 (×11): qty 1

## 2011-11-15 MED ORDER — ADULT MULTIVITAMIN W/MINERALS CH
1.0000 | ORAL_TABLET | Freq: Every day | ORAL | Status: DC
  Administered 2011-11-15 – 2011-11-20 (×6): 1 via ORAL
  Filled 2011-11-15 (×6): qty 1

## 2011-11-15 MED ORDER — SODIUM CHLORIDE 0.9 % IJ SOLN
3.0000 mL | INTRAMUSCULAR | Status: DC | PRN
  Administered 2011-11-17: 3 mL via INTRAVENOUS

## 2011-11-15 MED ORDER — HYDROMORPHONE 0.3 MG/ML IV SOLN
INTRAVENOUS | Status: AC
  Filled 2011-11-15: qty 25

## 2011-11-15 MED ORDER — FENTANYL CITRATE 0.05 MG/ML IJ SOLN
INTRAMUSCULAR | Status: DC | PRN
  Administered 2011-11-15: 25 ug via INTRAVENOUS
  Administered 2011-11-15: 200 ug via INTRAVENOUS
  Administered 2011-11-15: 25 ug via INTRAVENOUS

## 2011-11-15 MED ORDER — DIPHENHYDRAMINE HCL 50 MG/ML IJ SOLN: 12.5000 mg | Freq: Four times a day (QID) | INTRAMUSCULAR | Status: DC | PRN

## 2011-11-15 MED ORDER — ACETAMINOPHEN 325 MG PO TABS
650.0000 mg | ORAL_TABLET | ORAL | Status: DC | PRN
  Administered 2011-11-20: 650 mg via ORAL
  Filled 2011-11-15 (×2): qty 2

## 2011-11-15 MED ORDER — HYDROMORPHONE 0.3 MG/ML IV SOLN
INTRAVENOUS | Status: DC
  Administered 2011-11-15: 0.3 mg via INTRAVENOUS
  Administered 2011-11-16: 3.59 mg via INTRAVENOUS

## 2011-11-15 MED ORDER — ACETAMINOPHEN 650 MG RE SUPP: 650.0000 mg | RECTAL | Status: DC | PRN

## 2011-11-15 MED ORDER — CALCIUM CARBONATE-VITAMIN D 600-400 MG-UNIT PO TABS: 1.0000 | ORAL_TABLET | Freq: Two times a day (BID) | ORAL | Status: DC

## 2011-11-15 MED ORDER — SODIUM CHLORIDE 0.9 % IV SOLN: 250.0000 mL | INTRAVENOUS | Status: DC

## 2011-11-15 MED ORDER — NEOSTIGMINE METHYLSULFATE 1 MG/ML IJ SOLN
INTRAMUSCULAR | Status: DC | PRN
  Administered 2011-11-15: 5 mg via INTRAVENOUS

## 2011-11-15 MED ORDER — ONDANSETRON HCL 4 MG/2ML IJ SOLN: 4.0000 mg | INTRAMUSCULAR | Status: DC | PRN

## 2011-11-15 MED ORDER — LIDOCAINE-EPINEPHRINE 0.5-1:200000 % IJ SOLN
INTRAMUSCULAR | Status: DC | PRN
  Administered 2011-11-15: 9 mL

## 2011-11-15 MED ORDER — ONE-DAILY MULTI VITAMINS PO TABS: 1.0000 | ORAL_TABLET | Freq: Every day | ORAL | Status: DC

## 2011-11-15 MED ORDER — MENTHOL 3 MG MT LOZG: 1.0000 | LOZENGE | OROMUCOSAL | Status: DC | PRN

## 2011-11-15 MED ORDER — ZOLPIDEM TARTRATE 5 MG PO TABS: 5.0000 mg | ORAL_TABLET | Freq: Every evening | ORAL | Status: DC | PRN

## 2011-11-15 MED ORDER — ROCURONIUM BROMIDE 100 MG/10ML IV SOLN
INTRAVENOUS | Status: DC | PRN
  Administered 2011-11-15: 10 mg via INTRAVENOUS
  Administered 2011-11-15: 5 mg via INTRAVENOUS
  Administered 2011-11-15 (×3): 10 mg via INTRAVENOUS
  Administered 2011-11-15: 40 mg via INTRAVENOUS
  Administered 2011-11-15: 5 mg via INTRAVENOUS

## 2011-11-15 MED ORDER — HYDROCODONE-ACETAMINOPHEN 5-325 MG PO TABS
1.0000 | ORAL_TABLET | ORAL | Status: DC | PRN
  Administered 2011-11-16 – 2011-11-17 (×2): 1 via ORAL
  Administered 2011-11-17 – 2011-11-19 (×5): 2 via ORAL
  Filled 2011-11-15: qty 1
  Filled 2011-11-15 (×2): qty 2
  Filled 2011-11-15: qty 1
  Filled 2011-11-15 (×3): qty 2

## 2011-11-15 MED ORDER — LACTATED RINGERS IV SOLN: INTRAVENOUS | Status: DC

## 2011-11-15 MED ORDER — SENNA 8.6 MG PO TABS
1.0000 | ORAL_TABLET | Freq: Two times a day (BID) | ORAL | Status: DC
  Administered 2011-11-15 – 2011-11-20 (×10): 8.6 mg via ORAL
  Filled 2011-11-15 (×11): qty 1

## 2011-11-15 SURGICAL SUPPLY — 71 items
ADH SKN CLS APL DERMABOND .7 (GAUZE/BANDAGES/DRESSINGS) ×1
APL SKNCLS STERI-STRIP NONHPOA (GAUZE/BANDAGES/DRESSINGS) ×1
BAG DECANTER FOR FLEXI CONT (MISCELLANEOUS) ×2 IMPLANT
BENZOIN TINCTURE PRP APPL 2/3 (GAUZE/BANDAGES/DRESSINGS) ×1 IMPLANT
BLADE SURG ROTATE 9660 (MISCELLANEOUS) IMPLANT
BUR MATCHSTICK NEURO 3.0 LAGG (BURR) ×2 IMPLANT
CANISTER SUCTION 2500CC (MISCELLANEOUS) ×2 IMPLANT
CLOTH BEACON ORANGE TIMEOUT ST (SAFETY) ×2 IMPLANT
CONT SPEC 4OZ CLIKSEAL STRL BL (MISCELLANEOUS) ×2 IMPLANT
COVER BACK TABLE 24X17X13 BIG (DRAPES) ×1 IMPLANT
DECANTER SPIKE VIAL GLASS SM (MISCELLANEOUS) ×2 IMPLANT
DERMABOND ADVANCED (GAUZE/BANDAGES/DRESSINGS) ×1
DERMABOND ADVANCED .7 DNX12 (GAUZE/BANDAGES/DRESSINGS) ×1 IMPLANT
DRAPE C-ARM 42X72 X-RAY (DRAPES) ×4 IMPLANT
DRAPE LAPAROTOMY 100X72X124 (DRAPES) ×2 IMPLANT
DRAPE MICROSCOPE LEICA (MISCELLANEOUS) ×2 IMPLANT
DRAPE POUCH INSTRU U-SHP 10X18 (DRAPES) ×2 IMPLANT
DRAPE SURG 17X23 STRL (DRAPES) ×2 IMPLANT
DRESSING TELFA 8X3 (GAUZE/BANDAGES/DRESSINGS) IMPLANT
DURAPREP 26ML APPLICATOR (WOUND CARE) ×2 IMPLANT
ELECT REM PT RETURN 9FT ADLT (ELECTROSURGICAL) ×2
ELECTRODE REM PT RTRN 9FT ADLT (ELECTROSURGICAL) ×1 IMPLANT
GAUZE SPONGE 4X4 16PLY XRAY LF (GAUZE/BANDAGES/DRESSINGS) IMPLANT
GLOVE BIO SURGEON STRL SZ8 (GLOVE) ×1 IMPLANT
GLOVE BIOGEL PI IND STRL 6.5 (GLOVE) IMPLANT
GLOVE BIOGEL PI IND STRL 8.5 (GLOVE) IMPLANT
GLOVE BIOGEL PI INDICATOR 6.5 (GLOVE) ×1
GLOVE BIOGEL PI INDICATOR 8.5 (GLOVE) ×1
GLOVE ECLIPSE 6.5 STRL STRAW (GLOVE) ×5 IMPLANT
GLOVE ECLIPSE 7.5 STRL STRAW (GLOVE) ×1 IMPLANT
GLOVE ECLIPSE 8.5 STRL (GLOVE) ×1 IMPLANT
GLOVE EXAM NITRILE LRG STRL (GLOVE) IMPLANT
GLOVE EXAM NITRILE MD LF STRL (GLOVE) IMPLANT
GLOVE EXAM NITRILE XL STR (GLOVE) IMPLANT
GLOVE EXAM NITRILE XS STR PU (GLOVE) IMPLANT
GLOVE INDICATOR 6.5 STRL GRN (GLOVE) ×1 IMPLANT
GLOVE INDICATOR 7.5 STRL GRN (GLOVE) ×2 IMPLANT
GLOVE SS BIOGEL STRL SZ 7 (GLOVE) IMPLANT
GLOVE SUPERSENSE BIOGEL SZ 7 (GLOVE) ×3
GOWN BRE IMP SLV AUR LG STRL (GOWN DISPOSABLE) ×5 IMPLANT
GOWN BRE IMP SLV AUR XL STRL (GOWN DISPOSABLE) ×1 IMPLANT
GOWN STRL REIN 2XL LVL4 (GOWN DISPOSABLE) IMPLANT
KIT BASIN OR (CUSTOM PROCEDURE TRAY) ×2 IMPLANT
KIT POSITION SURG JACKSON T1 (MISCELLANEOUS) ×2 IMPLANT
KIT ROOM TURNOVER OR (KITS) ×2 IMPLANT
NDL HYPO 25X1 1.5 SAFETY (NEEDLE) ×1 IMPLANT
NDL SPNL 18GX3.5 QUINCKE PK (NEEDLE) IMPLANT
NEEDLE HYPO 25X1 1.5 SAFETY (NEEDLE) ×2 IMPLANT
NEEDLE SPNL 18GX3.5 QUINCKE PK (NEEDLE) ×2 IMPLANT
NS IRRIG 1000ML POUR BTL (IV SOLUTION) ×2 IMPLANT
PACK LAMINECTOMY NEURO (CUSTOM PROCEDURE TRAY) ×2 IMPLANT
PAD ARMBOARD 7.5X6 YLW CONV (MISCELLANEOUS) ×6 IMPLANT
ROD PEEK 30MM (Rod) ×2 IMPLANT
RUBBERBAND STERILE (MISCELLANEOUS) ×1 IMPLANT
SCREW NON CANN 6.5X40MM (Screw) ×1 IMPLANT
SCREW NON CANN 6.5X45MM (Screw) ×1 IMPLANT
SCREW SET PEEK EXT (Screw) ×4 IMPLANT
SPONGE GAUZE 4X4 12PLY (GAUZE/BANDAGES/DRESSINGS) IMPLANT
SPONGE LAP 4X18 X RAY DECT (DISPOSABLE) IMPLANT
SPONGE SURGIFOAM ABS GEL 100 (HEMOSTASIS) ×2 IMPLANT
STRIP CLOSURE SKIN 1/2X4 (GAUZE/BANDAGES/DRESSINGS) IMPLANT
SUT PROLENE 6 0 BV (SUTURE) IMPLANT
SUT VIC AB 0 CT1 18XCR BRD8 (SUTURE) ×1 IMPLANT
SUT VIC AB 0 CT1 8-18 (SUTURE) ×4
SUT VIC AB 2-0 CT1 18 (SUTURE) ×2 IMPLANT
SUT VIC AB 3-0 SH 8-18 (SUTURE) ×3 IMPLANT
SYR 20ML ECCENTRIC (SYRINGE) ×2 IMPLANT
TOWEL OR 17X24 6PK STRL BLUE (TOWEL DISPOSABLE) ×2 IMPLANT
TOWEL OR 17X26 10 PK STRL BLUE (TOWEL DISPOSABLE) ×3 IMPLANT
WATER STERILE IRR 1000ML POUR (IV SOLUTION) ×2 IMPLANT
legacy peek rod screw medtronic (Screw) ×2 IMPLANT

## 2011-11-15 NOTE — Anesthesia Postprocedure Evaluation (Signed)
  Anesthesia Post-op Note  Patient: [name] [name]  Procedure(s) Performed: Procedure(s) (LRB): POSTERIOR LUMBAR FUSION 1 LEVEL (N/A)  Patient Location: PACU  Anesthesia Type: General  Level of Consciousness: awake  Airway and Oxygen Therapy: Patient Spontanous Breathing  Post-op Pain: mild  Post-op Assessment: Post-op Vital signs reviewed  Post-op Vital Signs: stable  Complications: No apparent anesthesia complications

## 2011-11-15 NOTE — H&P (Signed)
BP 135/86  Pulse 106  Temp(Src) 97.5 F (36.4 C) (Oral)  Resp 20  Wt 53.071 kg (117 lb)  SpO2 97% [name] [name] is a patient of mine whom I last saw in September of 2009. At that time, she had an L4-5 semihemilaminectomy and resection of a synovial cyst. That level looked bad then. She presents today with problems in the lower extremities and it seems as if that level is even worse now. She has had a 68-month history of severe pain in the right lower extremity. She did undergo an MRI which revealed significant degenerative change at L4-5 since the operation. She comes in today to find out about her options. She is 78 years of age. She is right-handed and is retired. She describes numbness and tingling in the back and leg.  PAST MEDICAL HISTORY: Significant for hypertension and atrial fibrillation.  FAMILY HISTORY: Mother died and father died both with myocardial infarcts in their 71's.  PAST SURGICAL HISTORY: She has undergone two back operations and has had an appendectomy.  DRUG ALLERGIES: No known drug allergies.  SOCIAL HISTORY: She doesn't smoke. She doesn't use alcohol. She doesn't use illicit drugs. She reports being 5' tall and weighing 112 lbs.  REVIEW OF SYSTEMS: Positive for leg weakness, back pain, leg pain, arthritis. She denies constitutional, eye, ear, nose, throat, mouth, cardiovascular, respiratory, gastrointestinal, genitourinary, skin, neurological, psychiatric, endocrine, hematologic or allergic problems.  MEDICATIONS: She takes Diltiazem, Lasix, Coumadin, Metoprolol, Simvastatin, Tylenol, Calcium and multivitamin.  EXAMINATION: On examination she is alert, oriented x 4 and answering all questions appropriately. Memory, language, attention span and fund of knowledge are normal. Speech is clear and fluent. Symmetric facies. Symmetric facial movements.   Hearing intact to voice. Uvula elevates in the midline. Shoulder shrug is normal. Tongue protrudes in the midline. She has good  strength in the upper and lower extremities. She is 32, so some weakness when I test her on manual exam. She is moving slowly, though well. She is stable. She is not shaking. She is not trembling.  DIAGNOSTIC STUDIES: The MRI is horrible at L4-5; severely stenotic, significant facet arthropathy, synovial cyst have recurred at this level bilaterally. It just looks bad. The rest of the spine is better. A lot of degenerative change at 5-1 also.  SUMMARY: I spoke with [name] and her daughter today for about a half hour. She at this point would like to proceed with a lumbar fusion at L4-5. She is going to be fused only because I will have to take off so much bone and her joints are so decrepit that I do not think that I could do this without fusing her. I have absolutely no intention of placing interbodies. I think given her age and the fact that she is on Coumadin for atrial fibrillation will mean that the interbodies would only cause more harm than good. She had a very nice fusion at L5-S1 and absolutely no interbodies were used. Her biggest problem is compression of the thecal sac by two synovial cysts, one on the left and one of the right. I have done an operation and removed the synovial cyst at L4-5 in 2009 in just a limited procedure. This procedure will need to be more extensive and thus my rationale. She says that she is simply hurting too much at this point to not do something. The medication of the Tylenol and then the Vicodin is not helping. It was enough that it was causing her to be  quite miserable.   We will schedule this for next week, Friday 11/15/2011.

## 2011-11-15 NOTE — Preoperative (Signed)
Beta Blockers   Reason not to administer Beta Blockers:Not Applicable 

## 2011-11-15 NOTE — Transfer of Care (Signed)
Immediate Anesthesia Transfer of Care Note  Patient: [name] [name]  Procedure(s) Performed: Procedure(s) (LRB): POSTERIOR LUMBAR FUSION 1 LEVEL (N/A)  Patient Location: PACU  Anesthesia Type: General  Level of Consciousness: awake, alert  and oriented  Airway & Oxygen Therapy: Patient Spontanous Breathing and Patient connected to nasal cannula oxygen  Post-op Assessment: Report given to PACU RN, Post -op Vital signs reviewed and stable and Patient moving all extremities  Post vital signs: Reviewed and stable  Complications: No apparent anesthesia complications

## 2011-11-15 NOTE — Op Note (Signed)
11/15/2011  3:30 PM  PATIENT:  [name] [name]  78 y.o. female  PRE-OPERATIVE DIAGNOSIS:  spondylolisthesis lumbar four-five Synovial cysts L4/5 bilateral Lumbar stenosis, lateral recess stenosis  POST-OPERATIVE DIAGNOSIS:  spondylolisthesis lumbar four-five Synovial Cysts L4/5 bilateral Lumbar stenosis, lateral recess stenosis  PROCEDURE:  Procedure(s):Laminectomy L4, for decompression POSTERIOR LUMBAR FUSION 1 LEVEL Non-segmental pedicle screw fixation L4-5, Peek rods Morselized autograft  SURGEON:  Surgeon(s): Carmela Hurt, MD Maeola Harman, MD  ASSISTANTS:Joseph Venetia Maxon  ANESTHESIA:   general  EBL:  Total I/O In: 2250 [I.V.:2000; IV Piggyback:250] Out: 725 [Urine:425; Blood:300]  BLOOD ADMINISTERED:none  CELL SAVER GIVEN:none  COUNT:per nursing  DRAINS: none   SPECIMEN:  No Specimen  DICTATION: [name] was brought to the operating room intubated and placed under a general anesthetic. A foley catheter was placed under sterile conditions. She was rolled prone onto a Jackson Table with all pressure points properly padded. Her back was prepped and draped in a sterile fashion. I infiltrated 10cc 1/2%lidocaine/1:200,000 strength epinephrine into my old lumbar incision. I opened the incision with a 10 blade and took the incision down to the thoracolumbar fascia. I exposed the lamina of L3 and L4, and L2. I, using the monopolar cautery exposed the Pedicle screws at L5 and S1. I removed the old hardware in its entirety. I then placed CD Horizon Peek rod screws 7.5x89mm into both the L5 screw holes. I removed screws which were 6.50mm.  With Dr. Fredrich Birks assistance we performed an L4 laminectomy bilaterally to decompress the L4 and L3 nerve roots. We, with microscopic dissection, removed the synovial cysts on the right and left sides. The cyst was quite complex on both sides, but was peeled away slowly. The nerve roots of L3 and L4 were decompressed through the foramina after  removing the inferior facet of L3 bilaterally. The central canal was decompressed via the laminectomy.I irrigated the wound. I placed pedicle screws at L4 bilaterally with fluoroscopic guidance. I first drilled a pilot hole, then tapped, then placed the screws. I did not appreciate any cortical breeches. I decorticated the transverse process of L4 bilaterally and the superior facet of L5 bilaterally.  I completed the posterolateral arthrodesis with morselized autograft packed across the decorticated surfaces of L4 and L5. I connected the peek rods to the pedicle screws and completed the construct with locking caps. Xray showed the screws and rods to be in good position. I closed the wound in layers using vicryl sutures. I used dermabond for a sterile dressing.    PLAN OF CARE: Admit to inpatient   PATIENT DISPOSITION:  PACU - hemodynamically stable.   Delay start of Pharmacological VTE agent (>24hrs) due to surgical blood loss or risk of bleeding:  yes

## 2011-11-15 NOTE — Anesthesia Preprocedure Evaluation (Addendum)
Anesthesia Evaluation  Patient identified by MRN, date of birth, ID band Patient awake    Reviewed: Allergy & Precautions, H&P , NPO status , Patient's Chart, lab work & pertinent test results, reviewed documented beta blocker date and time   Airway Mallampati: II      Dental   Pulmonary shortness of breath and with exertion,  breath sounds clear to auscultation        Cardiovascular hypertension, Pt. on medications and Pt. on home beta blockers + dysrhythmias Atrial Fibrillation Rhythm:Irregular Rate:Normal     Neuro/Psych Anxiety    GI/Hepatic Neg liver ROS, GERD-  ,  Endo/Other  negative endocrine ROS  Renal/GU negative Renal ROS     Musculoskeletal   Abdominal   Peds  Hematology negative hematology ROS (+)   Anesthesia Other Findings   Reproductive/Obstetrics                         Anesthesia Physical Anesthesia Plan  ASA: III  Anesthesia Plan: General   Post-op Pain Management:    Induction: Intravenous  Airway Management Planned: Oral ETT  Additional Equipment:   Intra-op Plan:   Post-operative Plan: Extubation in OR  Informed Consent:   Dental advisory given  Plan Discussed with: CRNA  Anesthesia Plan Comments:        Anesthesia Quick Evaluation

## 2011-11-16 DIAGNOSIS — M48062 Spinal stenosis, lumbar region with neurogenic claudication: Secondary | ICD-10-CM | POA: Diagnosis present

## 2011-11-16 MED ORDER — ATORVASTATIN CALCIUM 10 MG PO TABS
10.0000 mg | ORAL_TABLET | Freq: Every day | ORAL | Status: DC
  Administered 2011-11-16 – 2011-11-19 (×4): 10 mg via ORAL
  Filled 2011-11-16 (×6): qty 1

## 2011-11-16 MED ORDER — METOPROLOL TARTRATE 12.5 MG HALF TABLET
12.5000 mg | ORAL_TABLET | Freq: Two times a day (BID) | ORAL | Status: DC
  Administered 2011-11-16 – 2011-11-20 (×8): 12.5 mg via ORAL
  Filled 2011-11-16 (×9): qty 1

## 2011-11-16 NOTE — Progress Notes (Signed)
PHARMACIST - PHYSICIAN COMMUNICATION DR:  Mikal Plane CONCERNING:  Simvastatin 20 mg daily ordered for [name] [name] (home med).  However, she is also on diltiazem.  There is a warning that diltiazem should not be used with doses of simvastatin > 10 mg due to risk of rhabdomyolysis.   RECOMMENDATION: Simvastatin has been changed to equivalent dose of atorvastatin 10 mg daily.  Please consider changing upon discharge as well.  Thanks! Reece Leader, Pharm D 11/16/2011 2:52 PM

## 2011-11-16 NOTE — Plan of Care (Signed)
Problem: Consults Goal: Diagnosis - Spinal Surgery Thoraco/Lumbar Spine Fusion     

## 2011-11-16 NOTE — Evaluation (Signed)
Physical Therapy Evaluation Patient Details Name: [name] [name] MRN: [id number] DOB: 02-07-1925 Today's Date: 11/16/2011  Problem List:  Patient Active Problem List  Diagnoses  . HYPERLIPIDEMIA-MIXED  . VISUAL CHANGES  . HYPERTENSION, UNSPECIFIED  . ATRIAL FIBRILLATION  . CAROTID ARTERY DISEASE  . GERD  . INSOMNIA  . DYSPNEA  . CHEST PAIN  . Carotid stenosis  . Hyperlipidemia  . Hypertension  . Dyspnea  . GERD (gastroesophageal reflux disease)  . Chest pain  . Aortic valve sclerosis  . Anxiety  . Visual changes  . Drug therapy  . Abnormal EKG  . Shortness of breath  . Back pain  . Spinal stenosis, lumbar region, with neurogenic claudication    Past Medical History:  Past Medical History  Diagnosis Date  . Carotid stenosis     Dopplers 2009 better than past; Dopplers 09/07/2009 <50% bilateral-stable  . Hyperlipidemia   . Hypertension   . Dyspnea   . Atrial fibrillation     DCCV prior to 04/05/2009 but reverted A.Fib, consultation w/Dr.Klein, decision to use Propafenone and   cardiovert again..cardioversion June 08, 2009.[name] on 4 days Propafenone... 100 Joules... normal sinus rhythm... Cardizem stopped.. /  return to atrial fib.[name]   /   cardionet on cardizem...09/14/2009...rates  52-190.  On Coumadin  . Chest pain     cardiac catheterization.. 2009... no significant CAD.[name] / recurrent chest pain 2010.. consider; EF 65% by Echo 04/2009  . Aortic valve sclerosis     Echo  04/2009  EF 65%.no stenosis.... echo... September, 2010  . Anxiety     May play a role with some symptoms  . Visual changes     Prior planned to have ophthalmology evlaution  . Drug therapy     coumadin  . Abnormal EKG     low voltage (no amyloid)  . Shortness of breath     Shortness of breath and fatigue June, 2012  . Back pain     February, 2013  . GERD (gastroesophageal reflux disease)     Aspirin Stopped  . Arthritis    Past Surgical History:  Past Surgical History  Procedure Date  .  Cataract extraction, bilateral   . Back surgery     X 2, most recent 2009    PT Assessment/Plan/Recommendation PT Assessment Clinical Impression Statement: 78 year old s/p PLIF (1 level). Pt presents with decreased mobility compared baseline, pt requiring mod/max assist for mobility today. Pt from [place], will be staying with daughter in [place] at D/C. Daughter's house will require pt to ascend a flight of stairs to reach bed and bathroom for showering. Pt may benefit from short stay in CIR to progress to level appropriate to D/C home with daughter.  PT Recommendation/Assessment: Patient will need skilled PT in the acute care venue PT Problem List: Decreased strength;Decreased activity tolerance;Decreased balance;Decreased mobility;Decreased knowledge of use of DME PT Therapy Diagnosis : Difficulty walking;Abnormality of gait;Generalized weakness;Acute pain PT Plan PT Frequency: Min 5X/week PT Treatment/Interventions: DME instruction;Gait training;Stair training;Functional mobility training;Therapeutic activities;Therapeutic exercise;Balance training;Patient/family education PT Recommendation Recommendations for Other Services: Rehab consult Follow Up Recommendations: Inpatient Rehab;Home health PT;Supervision for mobility/OOB (pending progress) Equipment Recommended: Defer to next venue PT Goals  Acute Rehab PT Goals PT Goal Formulation: With patient Pt will Roll Supine to Right Side: with modified independence PT Goal: Rolling Supine to Right Side - Progress: Goal set today Pt will Roll Supine to Left Side: with modified independence PT Goal: Rolling Supine to Left Side - Progress:  Goal set today Pt will go Supine/Side to Sit: with modified independence PT Goal: Supine/Side to Sit - Progress: Goal set today Pt will go Sit to Supine/Side: with modified independence PT Goal: Sit to Supine/Side - Progress: Goal set today Pt will go Sit to Stand: with modified independence PT Goal: Sit  to Stand - Progress: Goal set today Pt will go Stand to Sit: with modified independence PT Goal: Stand to Sit - Progress: Goal set today Pt will Ambulate: 51 - 150 feet;with supervision;with rolling walker PT Goal: Ambulate - Progress: Goal set today Pt will Go Up / Down Stairs: Flight;with supervision;with least restrictive assistive device;with rail(s) PT Goal: Up/Down Stairs - Progress: Goal set today  PT Evaluation Precautions/Restrictions  Precautions Precautions: Back Precaution Comments: Spoke with MD, MD ok for pt to proceed with PT without brace today. Can apply for comfort next session.  Required Braces or Orthoses: Yes Spinal Brace: Lumbar corset;Applied in sitting position Prior Functioning  Home Living Lives With: Alone (going to daughter's house until all better) Type of Home: House Home Layout: One level (with a basement (where washer and dryer are)) Home Access: Stairs to enter Entrance Stairs-Rails: None Entrance Stairs-Number of Steps: 1 Bathroom Shower/Tub: Health visitor: Standard Bathroom Accessibility: Yes How Accessible: Accessible via walker Home Adaptive Equipment: None Additional Comments: Going home with daughter, daughters house has 1 step to get into house, flight of steps (rail on [address])  to get to bedroom and to be able to bathe. Daughter has walk in shower. Regular height toilet.  Prior Function Level of Independence: Independent with basic ADLs;Independent with gait Driving: Yes Vocation: Retired Comments: Likes to go to mall, has little dog.  Cognition Cognition Arousal/Alertness: Awake/alert Overall Cognitive Status: Appears within functional limits for tasks assessed Orientation Level: Oriented X4 Sensation/Coordination Sensation Light Touch: Appears Intact (bil. LES) Extremity Assessment RUE Assessment RUE Assessment: Within Functional Limits LUE Assessment LUE Assessment: Within Functional Limits RLE Assessment RLE  Assessment: Exceptions to [facility] RLE AROM (degrees) Overall AROM Right Lower Extremity: Within functional limits for tasks assessed RLE Strength RLE Overall Strength Comments: Generalized deconditioning, symmetrical to Lt. LE, grossly >4-/5 LLE Assessment LLE Assessment: Exceptions to WFL LLE AROM (degrees) Overall AROM Left Lower Extremity: Within functional limits for tasks assessed LLE Strength LLE Overall Strength Comments: Generalized deconditioning, symmetrical to Rt. LE, grossly >4-/5 Mobility (including Balance) Bed Mobility Bed Mobility: Yes Rolling Left: 4: Min assist Rolling Left Details (indicate cue type and reason): Tactile/verbal cues for sequence, log roll technique.  Left Sidelying to Sit: 3: Mod assist Left Sidelying to Sit Details (indicate cue type and reason): Assist secondary to weakness and pain. Verbal cues for sequence and maintaining back precautions.  Sit to Sidelying Left: 3: Mod assist;With rail Sit to Sidelying Left Details (indicate cue type and reason): Assist to maintain back precautions. Transfers Transfers: Yes Sit to Stand: With upper extremity assist;2: Max assist;From bed Sit to Stand Details (indicate cue type and reason): Two attempts required. Verbal cues for safe UE placement, assist secondary to weakness and pain. Delayed knee and hip extension.  Stand to Sit: 3: Mod assist;To chair/3-in-1;With armrests Stand to Sit Details: Verbal cues for safety and control of descent. Cues for maintaining precautions Ambulation/Gait Ambulation/Gait: No  Posture/Postural Control Posture/Postural Control: No significant limitations Balance Balance Assessed: Yes Static Standing Balance Static Standing - Balance Support: Bilateral upper extremity supported Static Standing - Level of Assistance: 4: Min assist Static Standing - Comment/# of Minutes: Practiced  standing ~2-3 min.  Exercise  General Exercises - Lower Extremity Toe Raises: AROM;Both;15  reps;Seated Heel Raises: AROM;Both;15 reps;Seated End of Session PT - End of Session Equipment Utilized During Treatment: Gait belt Activity Tolerance: Patient tolerated treatment well;Patient limited by fatigue;Other (comment) (Treatment proceeded with caution given AFib and medication) Patient left: in chair;with call bell in reach;with family/visitor present Nurse Communication: Mobility status for transfers General Behavior During Session: [facility] for tasks performed Cognition: [facility] for tasks performed  Wilhemina Bonito 11/16/2011, 1:51 PM  Sherie Don) Carleene Mains PT, DPT Acute Rehabilitation [phone]

## 2011-11-16 NOTE — Progress Notes (Signed)
Subjective: Patient reports pain in her back. Legs feel better  Objective: Vital signs in last 24 hours: Temp:  [97.4 F (36.3 C)-99.5 F (37.5 C)] 97.8 F (36.6 C) (03/23 1005) Pulse Rate:  [44-121] 117  (03/23 1005) Resp:  [10-22] 18  (03/23 1005) BP: (94-133)/(61-83) 107/72 mmHg (03/23 1005) SpO2:  [95 %-100 %] 98 % (03/23 1005) Weight:  [53.071 kg (117 lb)] 53.071 kg (117 lb) (03/23 0539)  Intake/Output from previous day: 03/22 0701 - 03/23 0700 In: 2450 [I.V.:2200; IV Piggyback:250] Out: 1200 [Urine:900; Blood:300] Intake/Output this shift: Total I/O In: 240 [P.O.:240] Out: -   moving lower extremities well. Wound clean dry and without signs of infection.   Lab Results: No results found for this basename: WBC:2,HGB:2,HCT:2,PLT:2 in the last 72 hours BMET No results found for this basename: NA:2,K:2,CL:2,CO2:2,GLUCOSE:2,BUN:2,CREATININE:2,CALCIUM:2 in the last 72 hours  Studies/Results: Dg Lumbar Spine 2-3 Views  11/15/2011  *RADIOLOGY REPORT*  Clinical Data: Disc protrusion and spinal stenosis at L4-5.  LUMBAR SPINE - 2-3 VIEW  Comparison: MRI dated 08/02/2011  Findings: Radiograph #1 demonstrates an instrument at the level of the pedicles of L4.  Radiograph #2 demonstrates an instrument at the L4-5 level.  IMPRESSION: Instrument at L4-5.  Original Report Authenticated By: Gwynn Burly, M.D.   Dg Lumbar Spine 2-3 Views  11/15/2011  *RADIOLOGY REPORT*  Clinical Data: L4-5 laminectomy and fusion.  LUMBAR SPINE - 2-3 VIEW  Comparison: MR lumbar spine 08/02/2011.  Findings: Pedicle screws are in place at L4-5.  Screws in S1 seen on the comparison MRI had been removed.  No retained hardware.  IMPRESSION: L4-5 fusion in process.  Original Report Authenticated By: Bernadene Bell. Maricela Curet, M.D.    Assessment/Plan: Pca discontinued. Will stop oxygen. Continue pt. Keep foley.  LOS: 1 day     Oluwanifemi Susman L 11/16/2011, 10:19 AM

## 2011-11-16 NOTE — Progress Notes (Signed)
Occupational Therapy Evaluation Patient Details Name: [name] [name] MRN: [id number] DOB: 1925/03/21 Today's Date: 11/16/2011  Problem List:  Patient Active Problem List  Diagnoses  . HYPERLIPIDEMIA-MIXED  . VISUAL CHANGES  . HYPERTENSION, UNSPECIFIED  . ATRIAL FIBRILLATION  . CAROTID ARTERY DISEASE  . GERD  . INSOMNIA  . DYSPNEA  . CHEST PAIN  . Carotid stenosis  . Hyperlipidemia  . Hypertension  . Dyspnea  . GERD (gastroesophageal reflux disease)  . Chest pain  . Aortic valve sclerosis  . Anxiety  . Visual changes  . Drug therapy  . Abnormal EKG  . Shortness of breath  . Back pain  . Spinal stenosis, lumbar region, with neurogenic claudication    Past Medical History:  Past Medical History  Diagnosis Date  . Carotid stenosis     Dopplers 2009 better than past; Dopplers 09/07/2009 <50% bilateral-stable  . Hyperlipidemia   . Hypertension   . Dyspnea   . Atrial fibrillation     DCCV prior to 04/05/2009 but reverted A.Fib, consultation w/Dr.Klein, decision to use Propafenone and   cardiovert again..cardioversion June 08, 2009.[name] on 4 days Propafenone... 100 Joules... normal sinus rhythm... Cardizem stopped.. /  return to atrial fib.[name]   /   cardionet on cardizem...09/14/2009...rates  52-190.  On Coumadin  . Chest pain     cardiac catheterization.. 2009... no significant CAD.[name] / recurrent chest pain 2010.. consider; EF 65% by Echo 04/2009  . Aortic valve sclerosis     Echo  04/2009  EF 65%.no stenosis.... echo... September, 2010  . Anxiety     May play a role with some symptoms  . Visual changes     Prior planned to have ophthalmology evlaution  . Drug therapy     coumadin  . Abnormal EKG     low voltage (no amyloid)  . Shortness of breath     Shortness of breath and fatigue June, 2012  . Back pain     February, 2013  . GERD (gastroesophageal reflux disease)     Aspirin Stopped  . Arthritis    Past Surgical History:  Past Surgical History  Procedure Date  .  Cataract extraction, bilateral   . Back surgery     X 2, most recent 2009    OT Assessment/Plan/Recommendation OT Assessment Clinical Impression Statement: Pt s/p lumbar fusion thus affecting PLOF. Will benefit from acute OT services to address below problem list in prep for d/c to live with daughter in [place].  Pt may benefit from short stay in CIR pending progress.  OT Recommendation/Assessment: Patient will need skilled OT in the acute care venue OT Problem List: Decreased activity tolerance;Decreased knowledge of precautions;Decreased knowledge of use of DME or AE;Pain OT Therapy Diagnosis : Generalized weakness;Acute pain OT Plan OT Frequency: Min 2X/week OT Treatment/Interventions: Self-care/ADL training;DME and/or AE instruction;Therapeutic activities;Patient/family education OT Recommendation Recommendations for Other Services: Rehab consult Follow Up Recommendations: Home health OT;Inpatient Rehab;Supervision/Assistance - 24 hour Equipment Recommended:  (to be determined pending progress) Individuals Consulted Consulted and Agree with Results and Recommendations: Patient OT Goals Acute Rehab OT Goals OT Goal Formulation: With patient Time For Goal Achievement: 2 weeks ADL Goals Pt Will Perform Grooming: with supervision;Standing at sink ADL Goal: Grooming - Progress: Goal set today Pt Will Perform Lower Body Bathing: with supervision;Sit to stand from chair;Sit to stand from bed;with adaptive equipment ADL Goal: Lower Body Bathing - Progress: Goal set today Pt Will Perform Lower Body Dressing: with supervision;Sit to stand from chair;Sit to  stand from bed;with adaptive equipment ADL Goal: Lower Body Dressing - Progress: Goal set today Pt Will Transfer to Toilet: with supervision;with DME;3-in-1;Ambulation ADL Goal: Toilet Transfer - Progress: Goal set today Pt Will Perform Toileting - Clothing Manipulation: with supervision;Standing;with adaptive equipment ADL Goal:  Toileting - Clothing Manipulation - Progress: Goal set today Pt Will Perform Toileting - Hygiene: with supervision;Sit to stand from 3-in-1/toilet;Sitting on 3-in-1 or toilet ADL Goal: Toileting - Hygiene - Progress: Goal set today Miscellaneous OT Goals Miscellaneous OT Goal #1: Pt will perform bed mobility with supervision in prep for ADL activity. OT Goal: Miscellaneous Goal #1 - Progress: Goal set today  OT Evaluation Precautions/Restrictions  Precautions Precautions: Back Precaution Comments: Spoke with MD, MD ok for pt to proceed with PT without brace today. Can apply for comfort next session.  Required Braces or Orthoses: Yes Spinal Brace: Lumbar corset;Applied in sitting position Prior Functioning Home Living Lives With: Alone (going to daughter's house until all better) Type of Home: House Home Layout: One level (with a basement (where washer and dryer are)) Home Access: Stairs to enter Entrance Stairs-Rails: None Entrance Stairs-Number of Steps: 1 Bathroom Shower/Tub: Health visitor: Standard Bathroom Accessibility: Yes How Accessible: Accessible via walker Home Adaptive Equipment: None Additional Comments: Going home with daughter, daughters house has 1 step to get into house, flight of steps (rail on [address])  to get to bedroom and to be able to bathe. Daughter has walk in shower. Regular height toilet.  Prior Function Level of Independence: Independent with basic ADLs;Independent with gait Driving: Yes Vocation: Retired ADL ADL Lower Body Bathing: Performed;Maximal assistance Lower Body Bathing Details (indicate cue type and reason): pt unable to cross ankles over legs due to pain. Was able to cross legs PTA Where Assessed - Lower Body Bathing: Sit to stand from chair Lower Body Dressing: Performed;Maximal assistance Lower Body Dressing Details (indicate cue type and reason):  Pt unable to cross ankles over legs Where Assessed - Lower Body Dressing:  Sit to stand from chair Toilet Transfer: Simulated;Moderate assistance Toilet Transfer Details (indicate cue type and reason): chair to bed.  Toilet Transfer Method: Surveyor, minerals: Other (comment) (bed) Equipment Used: Rolling walker Vision/Perception    Cognition Cognition Arousal/Alertness: Awake/alert Overall Cognitive Status: Appears within functional limits for tasks assessed Orientation Level: Oriented X4 Sensation/Coordination Sensation Light Touch: Appears Intact (bil. LES) Extremity Assessment RUE Assessment RUE Assessment: Within Functional Limits LUE Assessment LUE Assessment: Within Functional Limits Mobility  Bed Mobility Bed Mobility: Yes Rolling Left: 4: Min assist Rolling Left Details (indicate cue type and reason): Tactile/verbal cues for sequence, log roll technique.  Left Sidelying to Sit: 3: Mod assist Left Sidelying to Sit Details (indicate cue type and reason): Assist secondary to weakness and pain. Verbal cues for sequence and maintaining back precautions.  Sit to Sidelying Left: 3: Mod assist;With rail Sit to Sidelying Left Details (indicate cue type and reason): Assist to maintain back precautions. Transfers Transfers: Yes Sit to Stand: 3: Mod assist;With armrests;With upper extremity assist;From chair/3-in-1 Sit to Stand Details (indicate cue type and reason): Two attempts required. Verbal cues for safe UE placement, assist secondary to weakness and pain. Delayed knee and hip extension.  Stand to Sit: 4: Min assist;To bed Stand to Sit Details: Verbal cues for safety and control of descent. Cues for maintaining precautions Exercises End of Session OT - End of Session Equipment Utilized During Treatment: Gait belt Activity Tolerance: Patient limited by pain Patient left: in bed;with  call bell in reach;with family/visitor present Nurse Communication: Other (comment) (pain) General Behavior During Session: [facility] for tasks  performed Cognition: [facility] for tasks performed  2:01 PM  11/16/2011 Cipriano Mile OTR/L Pager [phone] Office [phone]

## 2011-11-17 NOTE — Progress Notes (Signed)
   CARE MANAGEMENT NOTE 11/17/2011  Patient:  [name] [name], [name] [name]   Account Number:  [id number]  Date Initiated:  11/17/2011  Documentation initiated by:  [facility]  Subjective/Objective Assessment:   Laminectomy L4, for decompression  POSTERIOR LUMBAR FUSION     Action/Plan:   daugther, [name] [name]   Anticipated DC Date:  11/19/2011   Anticipated DC Plan:  IP REHAB FACILITY      DC Planning Services  CM consult      Choice offered to / List presented to:             Status of service:  In process, will continue to follow Medicare Important Message given?   (If response is "NO", the following Medicare IM given date fields will be blank) Date Medicare IM given:   Date Additional Medicare IM given:    Discharge Disposition:    Per UR Regulation:    If discussed at Long Length of Stay Meetings, dates discussed:    Comments:  11/17/2011 1655 Pt gave permission to speak with dtr, [name]. [employer] states both her and her sister live in [place]. They are requesting [name] [name] in [place]. Marlou Porch [facility] [phone] is contact and request call from Social Worker to set up rehab. Isidoro Donning RN CCM Case Mgmt phone [phone]

## 2011-11-17 NOTE — Progress Notes (Signed)
Physical Therapy Treatment Patient Details Name: [name] [name] MRN: [id number] DOB: 15-Feb-1925 Today's Date: 11/17/2011  PT Assessment/Plan  PT - Assessment/Plan Comments on Treatment Session: Pt progressing well. Pt still moves at an extremely slow pace. Pt requires increased assistance with bed mobility. Still recommend SNF, discussed with daughter. Daughter requests [facility] in [place] so that their mother is close to them upon d.c PT Plan: Discharge plan remains appropriate;Frequency remains appropriate PT Frequency: Min 5X/week Follow Up Recommendations: Inpatient Rehab;Skilled nursing facility Equipment Recommended: Defer to next venue PT Goals  Acute Rehab PT Goals PT Goal Formulation: With patient PT Goal: Sit to Supine/Side - Progress: Progressing toward goal PT Goal: Sit to Stand - Progress: Progressing toward goal PT Goal: Stand to Sit - Progress: Progressing toward goal PT Goal: Ambulate - Progress: Progressing toward goal  PT Treatment Precautions/Restrictions  Precautions Precautions: Back Precaution Comments: Spoke with MD, MD ok for pt to proceed with PT without brace today. Can apply for comfort next session.  Required Braces or Orthoses: No (see above regarding MD for comfort care) Spinal Brace: Lumbar corset;Applied in sitting position Restrictions Weight Bearing Restrictions: No Mobility (including Balance) Bed Mobility Bed Mobility: Yes Sit to Supine: 3: Mod assist;With rail Sit to Supine - Details (indicate cue type and reason): VC for sequencing to maintain back precautions. Assist with LEs as well as trunk for safety. Transfers Transfers: Yes Sit to Stand: 4: Min assist;With upper extremity assist;From bed;From chair/3-in-1 Sit to Stand Details (indicate cue type and reason): VC for hand placement for safety to RW. Min assist for forward translation and stability into standing Stand to Sit: 4: Min assist;With upper extremity assist;To chair/3-in-1;To  bed Stand to Sit Details: VC for hand placement as well as controlled descent onto both 3-1 and bed. Ambulation/Gait Ambulation/Gait: Yes Ambulation/Gait Assistance: 4: Min assist (Minguard assist) Ambulation/Gait Assistance Details (indicate cue type and reason): VC throughout for safety with distance to RW as well as sequencing. Pt able to turn while maintaining back precautions Ambulation Distance (Feet): 50 Feet Assistive device: Rolling walker Gait Pattern: Step-to pattern;Decreased hip/knee flexion - right;Decreased hip/knee flexion - left;Decreased stride length;Trunk flexed Gait velocity: decreased gait speed Stairs: No    Exercise    End of Session PT - End of Session Equipment Utilized During Treatment: Gait belt Activity Tolerance: Patient tolerated treatment well Patient left: in bed;with call bell in reach;with family/visitor present Nurse Communication: Mobility status for transfers;Mobility status for ambulation General Behavior During Session: [facility] for tasks performed Cognition: [facility] for tasks performed  Milana Kidney 11/17/2011, 1:29 PM  11/17/2011 Milana Kidney DPT PAGER: [phone] OFFICE: [phone]

## 2011-11-17 NOTE — Progress Notes (Signed)
Subjective: Patient reports doing well, but still mobilizing slowly.  Legs better, back sore.  Objective: Vital signs in last 24 hours: Temp:  [97.2 F (36.2 C)-99.8 F (37.7 C)] 98.6 F (37 C) (03/24 0503) Pulse Rate:  [111-143] 140  (03/24 0503) Resp:  [18-20] 20  (03/24 0503) BP: (107-123)/(70-78) 119/72 mmHg (03/24 0503) SpO2:  [90 %-98 %] 90 % (03/24 0503)  Intake/Output from previous day: 03/23 0701 - 03/24 0700 In: 1980 [P.O.:1200; I.V.:780] Out: 750 [Urine:750] Intake/Output this shift:    Physical Exam: Incision CDI, strength full in bilateral lower extremities  Lab Results: No results found for this basename: WBC:2,HGB:2,HCT:2,PLT:2 in the last 72 hours BMET No results found for this basename: NA:2,K:2,CL:2,CO2:2,GLUCOSE:2,BUN:2,CREATININE:2,CALCIUM:2 in the last 72 hours  Studies/Results: Dg Lumbar Spine 2-3 Views  11/15/2011  *RADIOLOGY REPORT*  Clinical Data: Disc protrusion and spinal stenosis at L4-5.  LUMBAR SPINE - 2-3 VIEW  Comparison: MRI dated 08/02/2011  Findings: Radiograph #1 demonstrates an instrument at the level of the pedicles of L4.  Radiograph #2 demonstrates an instrument at the L4-5 level.  IMPRESSION: Instrument at L4-5.  Original Report Authenticated By: Gwynn Burly, M.D.   Dg Lumbar Spine 2-3 Views  11/15/2011  *RADIOLOGY REPORT*  Clinical Data: L4-5 laminectomy and fusion.  LUMBAR SPINE - 2-3 VIEW  Comparison: MR lumbar spine 08/02/2011.  Findings: Pedicle screws are in place at L4-5.  Screws in S1 seen on the comparison MRI had been removed.  No retained hardware.  IMPRESSION: L4-5 fusion in process.  Original Report Authenticated By: Bernadene Bell. Maricela Curet, M.D.    Assessment/Plan: Making good progress.  Continue PT.    LOS: 2 days    Dorian Heckle, MD 11/17/2011, 7:25 AM

## 2011-11-17 NOTE — Progress Notes (Signed)
CSW received consult for SNF. PT/OT recommendation for CIR vs. HH noted. Pt plans to d/c home with her dtr after possible CIR stay. No other CSW needs identified. CSW signing off. Please re-consult if SNF needed.  Dellie Burns, MSW, [place] [phone] (weekend)

## 2011-11-18 MED ORDER — WARFARIN SODIUM 2 MG PO TABS
2.0000 mg | ORAL_TABLET | Freq: Once | ORAL | Status: AC
  Administered 2011-11-18: 2 mg via ORAL
  Filled 2011-11-18: qty 1

## 2011-11-18 MED ORDER — WARFARIN SODIUM 2 MG PO TABS
2.0000 mg | ORAL_TABLET | ORAL | Status: DC
  Filled 2011-11-18: qty 1

## 2011-11-18 MED ORDER — WARFARIN SODIUM 2 MG PO TABS: 2.0000 mg | ORAL_TABLET | ORAL | Status: DC

## 2011-11-18 MED ORDER — WARFARIN - PHYSICIAN DOSING INPATIENT: Freq: Every day | Status: DC

## 2011-11-18 MED ORDER — BISACODYL 5 MG PO TBEC
10.0000 mg | DELAYED_RELEASE_TABLET | Freq: Every day | ORAL | Status: DC | PRN
Start: 2011-11-18 — End: 2011-11-20
  Administered 2011-11-18 – 2011-11-20 (×2): 10 mg via ORAL
  Filled 2011-11-18 (×2): qty 2

## 2011-11-18 NOTE — Progress Notes (Signed)
UR COMPLETED  

## 2011-11-18 NOTE — Progress Notes (Addendum)
Physical Therapy Treatment Patient Details Name: [name] [name] MRN: [id number] DOB: 1925/06/08 Today's Date: 11/18/2011  PT Assessment/Plan  PT - Assessment/Plan Comments on Treatment Session: 78 year old s/p PLIF (1 level). Pt making good ambulatory gains however continues to need 24 hour supervision and min/moderate level of assist with return home. Pt has not been able to attempt ascending a flight of stairs yet. Pt will not have level of assist needed at home, continues to need SNF placement. Had discussion with pt, daughter, and SW in room. Explained CIR and SNF differences. Expressed need for f/u rehab to promote safe eventual return home.  PT Plan: Discharge plan remains appropriate;Frequency remains appropriate PT Frequency: Min 5X/week Follow Up Recommendations: Skilled nursing facility;Supervision/Assistance - 24 hour Equipment Recommended: Defer to next venue PT Goals  Acute Rehab PT Goals Pt will Roll Supine to Left Side: with modified independence PT Goal: Rolling Supine to Left Side - Progress: Progressing toward goal Pt will go Supine/Side to Sit: with modified independence PT Goal: Supine/Side to Sit - Progress: Progressing toward goal Pt will go Sit to Supine/Side: with modified independence PT Goal: Sit to Supine/Side - Progress: Progressing toward goal Pt will go Sit to Stand: with modified independence PT Goal: Sit to Stand - Progress: Progressing toward goal Pt will go Stand to Sit: with modified independence PT Goal: Stand to Sit - Progress: Progressing toward goal Pt will Ambulate: 51 - 150 feet;with supervision;with rolling walker PT Goal: Ambulate - Progress: Progressing toward goal  PT Treatment Precautions/Restrictions  Precautions Precautions: Back Precaution Comments: Spoke with MD, MD ok for pt to proceed with PT without brace today. Can apply for comfort next session.  Required Braces or Orthoses: Yes (for comfort) Spinal Brace: Lumbar  corset Restrictions Weight Bearing Restrictions: No Mobility (including Balance) Bed Mobility Bed Mobility: Yes Rolling Left: 4: Min assist Rolling Left Details (indicate cue type and reason): tactile/verbal cues for log roll and sequencing. Pt having some difficulty not twisting.  Left Sidelying to Sit: 3: Mod assist Left Sidelying to Sit Details (indicate cue type and reason): Assist secondary to weakness and pain. Verbal cues for sequence and maintaining back precautions Sit to Supine: 3: Mod assist;With rail;HOB flat Sit to Supine - Details (indicate cue type and reason): VC for sequencing to maintain back precautions. Assist with LEs as well as trunk. Pt finds this movement very painful. Sit to Sidelying Left: 3: Mod assist;With rail;HOB flat Sit to Sidelying Left Details (indicate cue type and reason): Assist to maintain back precautions, verbal cues for safe sequencing. Pt having difficulty making it to full sidelying - difficulty placing Lt. arm appropriately.  Transfers Transfers: Yes Sit to Stand: 4: Min assist;With upper extremity assist;From bed Sit to Stand Details (indicate cue type and reason): Repeated verbal cues for safe hand placement. Improved contribution to standing.  Stand to Sit: With upper extremity assist;To bed;Other (comment) (min-guard assist) Stand to Sit Details: Decreased assist needed to bed (elevated surface to pt compared to chair). Verbal cues for safe UE placement.  Ambulation/Gait Ambulation/Gait: Yes Ambulation/Gait Assistance: Other (comment) (min-guard assist) Ambulation/Gait Assistance Details (indicate cue type and reason): Pt with good distancing of RW throughout. Good adherence to back precautions with turning. Encouraged upright posture, improved step length to promote return to prior level of function and promote safe mobility. Ambulation Distance (Feet): 225 Feet Assistive device: Rolling walker Gait Pattern: Step-through pattern;Decreased  stride length;Trunk flexed (very minimal foot clearance bilaterally) Gait velocity: Very slow cadence Stairs: No  End of Session PT - End of Session Equipment Utilized During Treatment: Gait belt Activity Tolerance: Patient tolerated treatment well Patient left: in bed;with call bell in reach;with family/visitor present Nurse Communication: Mobility status for ambulation General Behavior During Session: [facility] for tasks performed Cognition: [facility] for tasks performed  Wilhemina Bonito 11/18/2011, 3:55 PM  Sherie Don) Carleene Mains PT, DPT Acute Rehabilitation [phone]

## 2011-11-18 NOTE — Progress Notes (Signed)
Clinical Social Work Department BRIEF PSYCHOSOCIAL ASSESSMENT 11/18/2011  Patient:  [name] [name], [name] [name]     Account Number:  [id number]     Admit date:  11/15/2011  Clinical Social Worker:  Peggyann Shoals  Date/Time:  11/18/2011 02:00 PM  Referred by:  RN  Date Referred:  11/18/2011 Referred for  SNF Placement   Other Referral:   Interview type:  Family Other interview type:    PSYCHOSOCIAL DATA Living Status:  FACILITY Admitted from facility:   Level of care:   Primary support name:  [facility] Primary support relationship to patient:  CHILD, ADULT Degree of support available:   Supportive, however daughter lives in [place]. [phone].    CURRENT CONCERNS Current Concerns  Post-Acute Placement   Other Concerns:    SOCIAL WORK ASSESSMENT / PLAN CSW met with pt and daughter to address consult. CSW introduced herself and explained role of social work. Pt lives in [place], but her daughters live in [place]. Pt planned on staying with her daughter at discharge, however pt will need more rehab prior to returning home. PT is recommending SNF. Pt is interested in SNF in the [place] area. Pt's daughter has spoken to [facility].   Assessment/plan status:  Other - See comment Other assessment/ plan:   CSW will complete FL2 and send request to facility. CSW will also submit for a PASARR number for an out of state pt. CSW will continue to follow to facilitate discharge to SNF.   Information/referral to community resources:   As needed.    PATIENT'S/FAMILY'S RESPONSE TO PLAN OF CARE: Pt is very pleasant and alert and oriented. Pt is agreeable to ST SNF placement in the [facility] to be closer to her family.

## 2011-11-18 NOTE — Progress Notes (Signed)
Orthopedic Tech Progress Note Patient Details:  Jonnell Hentges [facility] 07-Sep-1924 [id number]  Patient ID: OZIE DIMARIA, female   DOB: [birth year], 78 y.o.   MRN: [id number] Order completed by Storm Frisk, Meenakshi Sazama 11/18/2011, 10:55 PM

## 2011-11-18 NOTE — Progress Notes (Signed)
Occupational Therapy Treatment Patient Details Name: [name] [name] MRN: [id number] DOB: 10-26-1924 Today's Date: 11/18/2011  OT Assessment/Plan OT Assessment/Plan OT Frequency: Min 2X/week Recommendations for Other Services:  (pt. going SNF in [place] near her dtrs. homes) OT Goals Acute Rehab OT Goals Time For Goal Achievement: 2 weeks ADL Goals ADL Goal: Grooming - Progress: Progressing toward goals ADL Goal: Lower Body Bathing - Progress: Progressing toward goals ADL Goal: Lower Body Dressing - Progress: Progressing toward goals ADL Goal: Toilet Transfer - Progress: Progressing toward goals ADL Goal: Toileting - Clothing Manipulation - Progress: Progressing toward goals ADL Goal: Toileting - Hygiene - Progress: Progressing toward goals Miscellaneous OT Goals OT Goal: Miscellaneous Goal #1 - Progress: Progressing toward goals  OT Treatment Precautions/Restrictions  Precautions Precautions: Back Spinal Brace: Lumbar corset Restrictions Weight Bearing Restrictions: No   ADL ADL Grooming: Performed;Wash/dry hands;Minimal assistance Where Assessed - Grooming: Standing at sink Lower Body Bathing: Other (comment) (demonstrated LH sponge) Lower Body Dressing: Performed;Minimal assistance Lower Body Dressing Details (indicate cue type and reason): usded sock-aide and reacher to don/doff socks Where Assessed - Lower Body Dressing: Sitting, bed;Unsupported Toilet Transfer: Performed;Minimal assistance Toilet Transfer Details (indicate cue type and reason): cues for walker manipulation in/out of b.room around 3-n-1 Toilet Transfer Method: Ambulating Toilet Transfer Equipment: Raised toilet seat with arms (or 3-in-1 over toilet) Toileting - Clothing Manipulation: Performed;Supervision/safety Where Assessed - Toileting Clothing Manipulation: Sit to stand from 3-in-1 or toilet Toileting - Hygiene: Performed;Supervision/safety Where Assessed - Toileting Hygiene: Sit on 3-in-1 or  toilet Equipment Used: Long-handled sponge;Long-handled shoe horn;Reacher;Sock aid;Rolling walker Ambulation Related to ADLs: con't. cues for walker safety with ambulation around obstacles ADL Comments: able to use A/E for LB dressing but pts. dtr. was present and states pt. will have A for LB dressing but "may" be interested in the reacher Mobility  Bed Mobility Bed Mobility: Yes Rolling Left: 4: Min assist Rolling Left Details (indicate cue type and reason): tactile/verbal cues for log roll and sequencing Left Sidelying to Sit: 3: Mod assist Sit to Supine: 3: Mod assist;With rail;HOB flat Sit to Sidelying Left: 3: Mod assist;With rail;HOB flat Transfers Transfers: Yes Sit to Stand: 4: Min assist;With upper extremity assist;From bed;From chair/3-in-1 Stand to Sit: 4: Min assist;With upper extremity assist;To chair/3-in-1;To bed Stand to Sit Details: con't. cues for hand placement for controlled descent  Exercises    End of Session OT - End of Session Activity Tolerance: Patient tolerated treatment well Patient left: in bed;with call bell in reach;with family/visitor present General Behavior During Session: [facility] for tasks performed Cognition: [facility] for tasks performed  Robet Leu COTA/L 11/18/2011, 12:16 PM

## 2011-11-19 LAB — PROTIME-INR
INR: 1.3 (ref 0.00–1.49)
Prothrombin Time: 16.4 seconds — ABNORMAL HIGH (ref 11.6–15.2)

## 2011-11-19 MED ORDER — CYCLOBENZAPRINE HCL 10 MG PO TABS: 10.0000 mg | ORAL_TABLET | Freq: Three times a day (TID) | ORAL | Status: AC | PRN

## 2011-11-19 MED ORDER — BISACODYL 10 MG RE SUPP
10.0000 mg | Freq: Once | RECTAL | Status: AC
  Administered 2011-11-19: 10 mg via RECTAL
  Filled 2011-11-19: qty 1

## 2011-11-19 MED ORDER — HYDROCODONE-ACETAMINOPHEN 5-325 MG PO TABS: 1.0000 | ORAL_TABLET | Freq: Four times a day (QID) | ORAL | Status: AC | PRN

## 2011-11-19 NOTE — Discharge Summary (Signed)
Physician Discharge Summary  Patient ID: [name] MRN: [id number] DOB/AGE: Jul 09, 1925 78 y.o.  Admit date: 11/15/2011 Discharge date: 11/19/2011  Admission Diagnoses:Lumbar spondylosis, lumbar stenosis,neurogenic claudication, bilateral synovial cysts  Discharge Diagnoses: Diagnoses:Lumbar spondylosis, lumbar stenosis,neurogenic claudication, bilateral synovial cysts Principal Problem:  *Spinal stenosis, lumbar region, with neurogenic claudication   Discharged Condition: good  Hospital Course: [name] [name] was admitted to the hospital for a lumbar fusion and decompression due to synovial cysts and lumbar stenosis at L4/5. She did very well post op. She has been working with physical therapy. She is discharged to a rehabilitation facility in [place] to be closer to family. Wound is clean and dry at discharge. She is voiding and ambulating well  Consults: None  Significant Diagnostic Studies:   Treatments: surgery: L4/5 PLA, L4 laminectomy, Pedicle screw fixation CDHorizon with Peek rods L4/5.   Discharge Exam: Blood pressure 113/68, pulse 94, temperature 99 F (37.2 C), temperature source Oral, resp. rate 20, height 5' (1.524 m), weight 53.071 kg (117 lb), SpO2 96.00%. Neurologic: Alert and oriented X 3, normal strength and tone. Normal symmetric reflexes. Normal coordination and gait  Disposition: rehabilitation in [place]   Medication List  As of 11/19/2011  7:59 PM   STOP taking these medications         acetaminophen 500 MG tablet         TAKE these medications         Calcium 600+D 600-400 MG-UNIT per tablet   Generic drug: Calcium Carbonate-Vitamin D   Take 1 tablet by mouth 2 (two) times daily.      cyclobenzaprine 10 MG tablet   Commonly known as: FLEXERIL   Take 1 tablet (10 mg total) by mouth 3 (three) times daily as needed for muscle spasms.      diltiazem 180 MG 24 hr capsule   Commonly known as: CARDIZEM CD   Take 180 mg by mouth daily.     furosemide 20 MG tablet   Commonly known as: LASIX   Take 20 mg by mouth every morning. Take 1 tablet by mouth daily.      HYDROcodone-acetaminophen 5-325 MG per tablet   Commonly known as: NORCO   Take 1 tablet by mouth every 6 (six) hours as needed.      HYDROcodone-acetaminophen 5-325 MG per tablet   Commonly known as: NORCO   Take 1 tablet by mouth every 6 (six) hours as needed for pain.      metoprolol tartrate 25 MG tablet   Commonly known as: LOPRESSOR   25 mg. Take 1/2 tablet (12.5 mg) by mouth two times a day..      multivitamin tablet   Take 1 tablet by mouth daily.      simvastatin 20 MG tablet   Commonly known as: ZOCOR   Take 20 mg by mouth at bedtime.      warfarin 2 MG tablet   Commonly known as: COUMADIN   Take 2 mg by mouth every other day. Take daily by mouth as directed by the [facility] with Dr. Dimas Aguas.           Follow-up Information    Follow up with Yue Flanigan L, MD in 1 month. (call to make appt)    Contact information:   [address], [address] [place] [address] [phone]          Signed: Carmela Hurt 11/19/2011, 7:59 PM

## 2011-11-19 NOTE — Progress Notes (Signed)
Subjective: Patient reports am I really ready to leave the hospital?  Objective: Vital signs in last 24 hours: Temp:  [98.2 F (36.8 C)-99 F (37.2 C)] 99 F (37.2 C) (03/26 1839) Pulse Rate:  [94-124] 94  (03/26 1839) Resp:  [18-20] 20  (03/26 1839) BP: (96-126)/(61-77) 113/68 mmHg (03/26 1839) SpO2:  [91 %-96 %] 96 % (03/26 1839)  Intake/Output from previous day: 03/25 0701 - 03/26 0700 In: 1023 [P.O.:1020; I.V.:3] Out: -  Intake/Output this shift:    alert and oriented x 4. Moving lower extremities well. Wound clean and dry. No signs of infection.  Lab Results: No results found for this basename: WBC:2,HGB:2,HCT:2,PLT:2 in the last 72 hours BMET No results found for this basename: NA:2,K:2,CL:2,CO2:2,GLUCOSE:2,BUN:2,CREATININE:2,CALCIUM:2 in the last 72 hours  Studies/Results: No results found.  Assessment/Plan: DC tomorrow  LOS: 4 days     Sylvia Kondracki L 11/19/2011, 7:52 PM

## 2011-11-19 NOTE — Progress Notes (Signed)
Physical Therapy Treatment Patient Details Name: [name] [name] MRN: [id number] DOB: 1925/05/26 Today's Date: 11/19/2011  PT Assessment/Plan  PT - Assessment/Plan Comments on Treatment Session: 78 year old s/p PLIF (1 level). Pt continues to make good ambulatory gains however continues to need 24 hour supervision and min/moderate level of assist with return home. Pt attempting stairs today, only able to perform 2 with minimal assist. Pt continues to need SNF placement secondary to decreased level of assist at home. PT Plan: Discharge plan remains appropriate;Frequency remains appropriate PT Frequency: Min 5X/week Follow Up Recommendations: Skilled nursing facility;Supervision/Assistance - 24 hour Equipment Recommended: Defer to next venue PT Goals  Acute Rehab PT Goals Pt will go Sit to Supine/Side: with modified independence PT Goal: Sit to Supine/Side - Progress: Progressing toward goal Pt will go Sit to Stand: with modified independence PT Goal: Sit to Stand - Progress: Progressing toward goal Pt will go Stand to Sit: with modified independence PT Goal: Stand to Sit - Progress: Progressing toward goal Pt will Ambulate: 51 - 150 feet;with supervision;with rolling walker PT Goal: Ambulate - Progress: Progressing toward goal Pt will Go Up / Down Stairs: Flight;with least restrictive assistive device;with supervision PT Goal: Up/Down Stairs - Progress: Progressing toward goal  PT Treatment Precautions/Restrictions  Precautions Precautions: Back Precaution Comments: Verbally reviewed 3/3 back precautions with pt. Pt continues to struggle with avoiding twisting during functional activity. Required Braces or Orthoses: Yes (for comfort) Spinal Brace: Lumbar corset Restrictions Weight Bearing Restrictions: No Mobility (including Balance) Bed Mobility Bed Mobility: Yes Sit to Supine: With rail;HOB flat;4: Min assist Sit to Supine - Details (indicate cue type and reason): VC for  sequencing to maintain back precautions. Assist with LEs as well as trunk. Cues for log roll technique Transfers Transfers: Yes Sit to Stand: With upper extremity assist;4: Min assist;From chair/3-in-1 Sit to Stand Details (indicate cue type and reason): Min assist for only later part of transition indicating improved strength Stand to Sit: With upper extremity assist;To bed;Other (comment) (min-guard assist) Stand to Sit Details: Verbal cues for safety, pt with correct UE placement Ambulation/Gait Ambulation/Gait: Yes Ambulation/Gait Assistance: Other (comment) (min-guard assist) Ambulation/Gait Assistance Details (indicate cue type and reason): Encouraged upright posture, improved step length to promote return to prior level of function and promote safe mobility. Ambulation Distance (Feet): 350 Feet Assistive device: Rolling walker Gait Pattern: Step-through pattern;Decreased stride length;Trunk flexed (very minimal foot clearance bilaterally) Gait velocity: Very slow cadence Stairs: Yes Stairs Assistance: 4: Min assist Stairs Assistance Details (indicate cue type and reason): Cues for safe placement of RW, sequencing. Assist secondary to weakness. Pt only able to tolerate 2 steps today Stair Management Technique: Two rails;Step to pattern Number of Stairs: 2     End of Session PT - End of Session Equipment Utilized During Treatment: Gait belt Activity Tolerance: Patient tolerated treatment well Patient left: in bed;with call bell in reach;with family/visitor present (pt had been up for 2-3 hours) Nurse Communication: Mobility status for ambulation General Behavior During Session: [facility] for tasks performed Cognition: [facility] for tasks performed  Wilhemina Bonito 11/19/2011, 6:19 PM  Dahlia Client (Beverely Pace) Carleene Mains PT, DPT Acute Rehabilitation [phone]

## 2011-11-19 NOTE — Progress Notes (Signed)
CSW met with pt and pt's daughter regarding bed offer to [facility] in [place], [place]. Pt's daughter and pt prefers [facility]. CSW awaiting [place] of [place] PASARR [place] needed for SNF placement. CSW will continue to follow to facilitate discharge to SNF.   Dede Query, MSW, Theresia Majors [phone]

## 2011-11-20 DIAGNOSIS — I359 Nonrheumatic aortic valve disorder, unspecified: Secondary | ICD-10-CM | POA: Diagnosis not present

## 2011-11-20 DIAGNOSIS — I2510 Atherosclerotic heart disease of native coronary artery without angina pectoris: Secondary | ICD-10-CM | POA: Diagnosis not present

## 2011-11-20 DIAGNOSIS — K219 Gastro-esophageal reflux disease without esophagitis: Secondary | ICD-10-CM | POA: Diagnosis not present

## 2011-11-20 DIAGNOSIS — R0989 Other specified symptoms and signs involving the circulatory and respiratory systems: Secondary | ICD-10-CM | POA: Diagnosis not present

## 2011-11-20 DIAGNOSIS — E785 Hyperlipidemia, unspecified: Secondary | ICD-10-CM | POA: Diagnosis not present

## 2011-11-20 DIAGNOSIS — Z981 Arthrodesis status: Secondary | ICD-10-CM | POA: Diagnosis not present

## 2011-11-20 DIAGNOSIS — R5381 Other malaise: Secondary | ICD-10-CM | POA: Diagnosis not present

## 2011-11-20 DIAGNOSIS — I10 Essential (primary) hypertension: Secondary | ICD-10-CM | POA: Diagnosis not present

## 2011-11-20 DIAGNOSIS — Z5189 Encounter for other specified aftercare: Secondary | ICD-10-CM | POA: Diagnosis not present

## 2011-11-20 DIAGNOSIS — F411 Generalized anxiety disorder: Secondary | ICD-10-CM | POA: Diagnosis not present

## 2011-11-20 DIAGNOSIS — M48061 Spinal stenosis, lumbar region without neurogenic claudication: Secondary | ICD-10-CM | POA: Diagnosis not present

## 2011-11-20 DIAGNOSIS — Z4789 Encounter for other orthopedic aftercare: Secondary | ICD-10-CM | POA: Diagnosis not present

## 2011-11-20 DIAGNOSIS — I4891 Unspecified atrial fibrillation: Secondary | ICD-10-CM | POA: Diagnosis not present

## 2011-11-20 LAB — PROTIME-INR
INR: 1.48 (ref 0.00–1.49)
Prothrombin Time: 18.2 seconds — ABNORMAL HIGH (ref 11.6–15.2)

## 2011-11-20 MED ORDER — FLEET ENEMA 7-19 GM/118ML RE ENEM
1.0000 | ENEMA | Freq: Every day | RECTAL | Status: DC | PRN
  Filled 2011-11-20: qty 1

## 2011-11-20 MED ORDER — WHITE PETROLATUM GEL
Status: AC
  Filled 2011-11-20: qty 5

## 2011-11-20 NOTE — Progress Notes (Signed)
   CARE MANAGEMENT NOTE 11/20/2011   DME arranged  3-N-1  Levan Hurst      DME agency  [facility]  Status of service:  Completed, signed off

## 2011-11-20 NOTE — Progress Notes (Addendum)
Clinical Social Work Department CLINICAL SOCIAL WORK PLACEMENT NOTE 11/20/2011  Patient:  [name] [name], [name] [name]  Account Number:  [id number] Admit date:  11/15/2011  Clinical Social Worker:  Peggyann Shoals  Date/time:  11/20/2011 10:25 AM  Clinical Social Work is seeking post-discharge placement for this patient at the following level of care:   SKILLED NURSING   (*CSW will update this form in Epic as items are completed)   11/18/2011  Patient/family provided with [facility] Department of Clinical Social Work's list of facilities offering this level of care within the geographic area requested by the patient (or if unable, by the patient's family).  11/18/2011  Patient/family informed of their freedom to choose among providers that offer the needed level of care, that participate in Medicare, Medicaid or managed care program needed by the patient, have an available bed and are willing to accept the patient.  11/18/2011  Patient/family informed of [facility]' ownership interest in [facility], as well as of the fact that they are under no obligation to receive care at this facility.  PASARR submitted to [employer] on 11/20/2011 PASARR number received from [employer] on 11/20/2011  FL2 transmitted to all facilities in geographic area requested by pt/family on  11/18/2011 FL2 transmitted to all facilities within larger geographic area on   Patient informed that his/her managed care company has contracts with or will negotiate with  certain facilities, including the following:     Patient/family informed of bed offers received:  11/19/2011 Patient chooses bed at [facility] Physician recommends and patient chooses bed at  [facility]  Patient to be transferred to [facility] on  11/20/2011 Patient to be transferred to facility by pt's daughter  The following physician request were entered in Epic:   Additional Comments:

## 2011-11-20 NOTE — Progress Notes (Signed)
Occupational Therapy Treatment and Discharge to SNF Patient Details Name: [name] [name] MRN: [id number] DOB: [birth year] Today's Date: 11/20/2011  OT Assessment/Plan OT Assessment/Plan Follow Up Recommendations: Skilled nursing facility;Other (comment) (patient & daughter have chosen to D/C to SNF in [place]) Equipment Recommended: Defer to next venue OT Goals ADL Goals Pt Will Perform Grooming: with supervision;Standing at sink ADL Goal: Grooming - Progress: Progressing toward goals Pt Will Perform Lower Body Bathing: with supervision;Sit to stand from chair;Sit to stand from bed;with adaptive equipment ADL Goal: Lower Body Bathing - Progress: Progressing toward goals Pt Will Transfer to Toilet: with supervision;with DME;3-in-1;Ambulation ADL Goal: Toilet Transfer - Progress: Progressing toward goals Pt Will Perform Toileting - Clothing Manipulation: with supervision;Standing;with adaptive equipment ADL Goal: Toileting - Clothing Manipulation - Progress: Progressing toward goals Pt Will Perform Toileting - Hygiene: with supervision;Sit to stand from 3-in-1/toilet;Sitting on 3-in-1 or toilet ADL Goal: Toileting - Hygiene - Progress: Progressing toward goals Miscellaneous OT Goals Miscellaneous OT Goal #1: Pt will perform bed mobility with supervision in prep for ADL activity. OT Goal: Miscellaneous Goal #1 - Progress: Progressing toward goals  OT Treatment Precautions/Restrictions  Precautions Precautions: Back;Fall Precaution Comments: Verbally reviewed 3/3 back precautions with pt. Pt continues to struggle with avoiding twisting during functional activity. Required Braces or Orthoses: Yes Spinal Brace: Lumbar corset Restrictions Weight Bearing Restrictions: No   ADL ADL Grooming: Performed;Wash/dry hands;Wash/dry face Where Assessed - Grooming: Standing at sink Upper Body Bathing: Performed;Chest;Right arm;Left arm;Abdomen;Set up Upper Body Bathing Details (indicate cue type  and reason): verbal and demonstration cues for don/doff lumbar corset Where Assessed - Upper Body Bathing: Sitting at sink;Standing at sink Lower Body Bathing: Performed;Moderate assistance Where Assessed - Lower Body Bathing: Sitting at sink;Standing at sink Toilet Transfer: Performed;Minimal assistance Toilet Transfer Details (indicate cue type and reason): cues for walker manipulation in/out of b.room around 3-n-1 Toilet Transfer Method: Ambulating Toilet Transfer Equipment: Regular height toilet;Grab bars Toileting - Clothing Manipulation: Performed;Supervision/safety Where Assessed - Toileting Clothing Manipulation: Sit to stand from 3-in-1 or toilet Toileting - Hygiene: Performed;Supervision/safety Where Assessed - Toileting Hygiene: Sit on 3-in-1 or toilet Equipment Used: Rolling walker ADL Comments: Patient unable to participate in full bath secondary to issues related to bowel impaction (pain, BM leaking during all mobility secondary to medication to resolve BM issue).  Partient with increased anxiety due to Bowel issues.   Mobility  Bed Mobility Bed Mobility: Yes Rolling Left: 4: Min assist Left Sidelying to Sit: 3: Mod assist Sit to Supine: With rail;HOB flat;4: Min assist Transfers Sit to Stand: With upper extremity assist;From chair/3-in-1;5: Supervision Stand to Sit: With upper extremity assist;To bed;Other (comment);5: Supervision  End of Session OT - End of Session Activity Tolerance: Patient limited by pain Patient left: Other (comment) (on [facility] with RN in room)  Pleas Patricia, MS, OTR/L Occupational Therapist Pager [phone]  11/20/2011, 12:20 PM

## 2011-11-20 NOTE — Progress Notes (Signed)
Pt was d/c to SNF with daughter. Physical Therapy assisted pt in exiting the hospital and transferring into the car. Pt stable,vitals stable

## 2011-11-20 NOTE — Progress Notes (Signed)
Pt is ready for discharge to [facility] today. [facility] # has been received and facility has received discharge summary and AVS. Facility is ready to accept. Pt's daughter will provide transportation. Pt's daughter has medical record packets and has been instructed to give it to admission coordinator at facility. CSW is signing off as no further clinical social work needs identified.   Dede Query, MSW, Theresia Majors [phone]

## 2011-11-20 NOTE — Progress Notes (Signed)
Physical Therapy Treatment Patient Details Name: [name] [name] MRN: [id number] DOB: May 13, 1925 Today's Date: 11/20/2011  PT Assessment/Plan  PT - Assessment/Plan Comments on Treatment Session: 78 year old s/p PLIF (1 level). Focus of session was again improving ambulation mechanics and endurance in addition to appropriate car transfers promoting safety and safe positioning once in car. Pt continues to make good improvement. PT Plan: Discharge plan remains appropriate;Frequency remains appropriate PT Frequency: Min 5X/week Follow Up Recommendations: Skilled nursing facility;Supervision/Assistance - 24 hour Equipment Recommended: Defer to next venue PT Goals  Acute Rehab PT Goals Pt will go Sit to Stand: with modified independence PT Goal: Sit to Stand - Progress: Progressing toward goal Pt will go Stand to Sit: with modified independence PT Goal: Stand to Sit - Progress: Progressing toward goal Pt will Ambulate: 51 - 150 feet;with supervision;with rolling walker PT Goal: Ambulate - Progress: Met  PT Treatment Precautions/Restrictions  Precautions Precautions: Back;Fall Precaution Comments: Verbally reviewed 3/3 back precautions with pt. Pt doing well with adherence during functional activity. Required Braces or Orthoses: Yes Spinal Brace: Lumbar corset Restrictions Weight Bearing Restrictions: No Mobility (including Balance) Bed Mobility Bed Mobility: No (seated EOB at start) Transfers Transfers: Yes Sit to Stand: With upper extremity assist;From chair/3-in-1;5: Supervision;From bed Sit to Stand Details (indicate cue type and reason): Verbal cues for safety, placement of UEs. Stand to Sit: With upper extremity assist;Other (comment);4: Min assist;5: Supervision;To chair/3-in-1 Stand to Sit Details: Min assist to wheelchair and to car seat. Verbal cues for safety with car transfer. Assist to control descent. Stand Pivot Transfers: 4: Min assist Stand Pivot Transfer Details  (indicate cue type and reason): Assist for wheelchair to car transfer. Max verbal cues required for safety, maintaining precautions,  and proper alignment.  Ambulation/Gait Ambulation/Gait: Yes Ambulation/Gait Assistance: 5: Supervision Ambulation/Gait Assistance Details (indicate cue type and reason): Encouraged upright posture, improved step length to promote return to prior level of function and promote safe mobility. Ambulation Distance (Feet): 125 Feet Assistive device: Rolling walker Gait Pattern: Step-through pattern;Trunk flexed;Decreased stride length (minimal foot clearance bilaterally. ) Stairs: No  Posture/Postural Control Posture/Postural Control: No significant limitations Balance Balance Assessed: Yes Static Standing Balance Static Standing - Balance Support: Bilateral upper extremity supported Static Standing - Level of Assistance: 4: Min assist;5: Stand by assistance Exercise    End of Session PT - End of Session Equipment Utilized During Treatment: Gait belt Activity Tolerance: Patient tolerated treatment well Patient left: with family/visitor present;Other (comment) (in car for trip to SNF) Nurse Communication: Mobility status for ambulation General Behavior During Session: [facility] for tasks performed Cognition: [facility] for tasks performed  Wilhemina Bonito 11/20/2011, 5:34 PM  Sherie Don) Carleene Mains PT, DPT Acute Rehabilitation [phone]

## 2011-11-21 DIAGNOSIS — I4891 Unspecified atrial fibrillation: Secondary | ICD-10-CM | POA: Diagnosis not present

## 2011-11-21 DIAGNOSIS — R5381 Other malaise: Secondary | ICD-10-CM | POA: Diagnosis not present

## 2011-11-21 DIAGNOSIS — I10 Essential (primary) hypertension: Secondary | ICD-10-CM | POA: Diagnosis not present

## 2011-11-21 DIAGNOSIS — Z981 Arthrodesis status: Secondary | ICD-10-CM | POA: Diagnosis not present

## 2011-11-21 DIAGNOSIS — M48061 Spinal stenosis, lumbar region without neurogenic claudication: Secondary | ICD-10-CM | POA: Diagnosis not present

## 2011-11-22 DIAGNOSIS — Z981 Arthrodesis status: Secondary | ICD-10-CM | POA: Diagnosis not present

## 2011-11-22 DIAGNOSIS — I4891 Unspecified atrial fibrillation: Secondary | ICD-10-CM | POA: Diagnosis not present

## 2011-11-22 DIAGNOSIS — I10 Essential (primary) hypertension: Secondary | ICD-10-CM | POA: Diagnosis not present

## 2011-11-22 DIAGNOSIS — R5381 Other malaise: Secondary | ICD-10-CM | POA: Diagnosis not present

## 2011-11-22 DIAGNOSIS — M48061 Spinal stenosis, lumbar region without neurogenic claudication: Secondary | ICD-10-CM | POA: Diagnosis not present

## 2011-11-25 DIAGNOSIS — M48061 Spinal stenosis, lumbar region without neurogenic claudication: Secondary | ICD-10-CM | POA: Diagnosis not present

## 2011-11-25 DIAGNOSIS — I4891 Unspecified atrial fibrillation: Secondary | ICD-10-CM | POA: Diagnosis not present

## 2011-11-25 DIAGNOSIS — I10 Essential (primary) hypertension: Secondary | ICD-10-CM | POA: Diagnosis not present

## 2011-11-25 DIAGNOSIS — R5381 Other malaise: Secondary | ICD-10-CM | POA: Diagnosis not present

## 2011-11-25 DIAGNOSIS — Z981 Arthrodesis status: Secondary | ICD-10-CM | POA: Diagnosis not present

## 2011-11-27 DIAGNOSIS — M48061 Spinal stenosis, lumbar region without neurogenic claudication: Secondary | ICD-10-CM | POA: Diagnosis not present

## 2011-11-27 DIAGNOSIS — I10 Essential (primary) hypertension: Secondary | ICD-10-CM | POA: Diagnosis not present

## 2011-11-27 DIAGNOSIS — R5381 Other malaise: Secondary | ICD-10-CM | POA: Diagnosis not present

## 2011-11-27 DIAGNOSIS — Z981 Arthrodesis status: Secondary | ICD-10-CM | POA: Diagnosis not present

## 2011-11-27 DIAGNOSIS — I4891 Unspecified atrial fibrillation: Secondary | ICD-10-CM | POA: Diagnosis not present

## 2011-11-28 DIAGNOSIS — I10 Essential (primary) hypertension: Secondary | ICD-10-CM | POA: Diagnosis not present

## 2011-11-28 DIAGNOSIS — I4891 Unspecified atrial fibrillation: Secondary | ICD-10-CM | POA: Diagnosis not present

## 2011-11-28 DIAGNOSIS — R5381 Other malaise: Secondary | ICD-10-CM | POA: Diagnosis not present

## 2011-11-28 DIAGNOSIS — M48061 Spinal stenosis, lumbar region without neurogenic claudication: Secondary | ICD-10-CM | POA: Diagnosis not present

## 2011-11-28 DIAGNOSIS — Z981 Arthrodesis status: Secondary | ICD-10-CM | POA: Diagnosis not present

## 2011-11-30 DIAGNOSIS — M6281 Muscle weakness (generalized): Secondary | ICD-10-CM | POA: Diagnosis not present

## 2011-11-30 DIAGNOSIS — M1990 Unspecified osteoarthritis, unspecified site: Secondary | ICD-10-CM | POA: Diagnosis not present

## 2011-11-30 DIAGNOSIS — G8929 Other chronic pain: Secondary | ICD-10-CM | POA: Diagnosis not present

## 2011-11-30 DIAGNOSIS — Z4789 Encounter for other orthopedic aftercare: Secondary | ICD-10-CM | POA: Diagnosis not present

## 2011-11-30 DIAGNOSIS — I519 Heart disease, unspecified: Secondary | ICD-10-CM | POA: Diagnosis not present

## 2011-11-30 DIAGNOSIS — Z5181 Encounter for therapeutic drug level monitoring: Secondary | ICD-10-CM | POA: Diagnosis not present

## 2011-11-30 DIAGNOSIS — I4891 Unspecified atrial fibrillation: Secondary | ICD-10-CM | POA: Diagnosis not present

## 2011-11-30 DIAGNOSIS — Z7901 Long term (current) use of anticoagulants: Secondary | ICD-10-CM | POA: Diagnosis not present

## 2011-11-30 DIAGNOSIS — I10 Essential (primary) hypertension: Secondary | ICD-10-CM | POA: Diagnosis not present

## 2011-12-01 DIAGNOSIS — Z4789 Encounter for other orthopedic aftercare: Secondary | ICD-10-CM | POA: Diagnosis not present

## 2011-12-01 DIAGNOSIS — M6281 Muscle weakness (generalized): Secondary | ICD-10-CM | POA: Diagnosis not present

## 2011-12-01 DIAGNOSIS — I10 Essential (primary) hypertension: Secondary | ICD-10-CM | POA: Diagnosis not present

## 2011-12-01 DIAGNOSIS — G8929 Other chronic pain: Secondary | ICD-10-CM | POA: Diagnosis not present

## 2011-12-01 DIAGNOSIS — I4891 Unspecified atrial fibrillation: Secondary | ICD-10-CM | POA: Diagnosis not present

## 2011-12-01 DIAGNOSIS — M1990 Unspecified osteoarthritis, unspecified site: Secondary | ICD-10-CM | POA: Diagnosis not present

## 2011-12-02 DIAGNOSIS — I10 Essential (primary) hypertension: Secondary | ICD-10-CM | POA: Diagnosis not present

## 2011-12-02 DIAGNOSIS — M6281 Muscle weakness (generalized): Secondary | ICD-10-CM | POA: Diagnosis not present

## 2011-12-02 DIAGNOSIS — Z4789 Encounter for other orthopedic aftercare: Secondary | ICD-10-CM | POA: Diagnosis not present

## 2011-12-02 DIAGNOSIS — I4891 Unspecified atrial fibrillation: Secondary | ICD-10-CM | POA: Diagnosis not present

## 2011-12-02 DIAGNOSIS — G8929 Other chronic pain: Secondary | ICD-10-CM | POA: Diagnosis not present

## 2011-12-02 DIAGNOSIS — M1990 Unspecified osteoarthritis, unspecified site: Secondary | ICD-10-CM | POA: Diagnosis not present

## 2011-12-03 DIAGNOSIS — M7130 Other bursal cyst, unspecified site: Secondary | ICD-10-CM | POA: Diagnosis not present

## 2011-12-05 DIAGNOSIS — M6281 Muscle weakness (generalized): Secondary | ICD-10-CM | POA: Diagnosis not present

## 2011-12-05 DIAGNOSIS — G8929 Other chronic pain: Secondary | ICD-10-CM | POA: Diagnosis not present

## 2011-12-05 DIAGNOSIS — Z4789 Encounter for other orthopedic aftercare: Secondary | ICD-10-CM | POA: Diagnosis not present

## 2011-12-05 DIAGNOSIS — I10 Essential (primary) hypertension: Secondary | ICD-10-CM | POA: Diagnosis not present

## 2011-12-05 DIAGNOSIS — M1990 Unspecified osteoarthritis, unspecified site: Secondary | ICD-10-CM | POA: Diagnosis not present

## 2011-12-05 DIAGNOSIS — I4891 Unspecified atrial fibrillation: Secondary | ICD-10-CM | POA: Diagnosis not present

## 2011-12-07 DIAGNOSIS — I4891 Unspecified atrial fibrillation: Secondary | ICD-10-CM | POA: Diagnosis not present

## 2011-12-09 DIAGNOSIS — I4891 Unspecified atrial fibrillation: Secondary | ICD-10-CM | POA: Diagnosis not present

## 2011-12-09 DIAGNOSIS — I10 Essential (primary) hypertension: Secondary | ICD-10-CM | POA: Diagnosis not present

## 2011-12-09 DIAGNOSIS — M48061 Spinal stenosis, lumbar region without neurogenic claudication: Secondary | ICD-10-CM | POA: Diagnosis not present

## 2011-12-09 DIAGNOSIS — R5381 Other malaise: Secondary | ICD-10-CM | POA: Diagnosis not present

## 2011-12-09 DIAGNOSIS — Z981 Arthrodesis status: Secondary | ICD-10-CM | POA: Diagnosis not present

## 2011-12-10 DIAGNOSIS — I4891 Unspecified atrial fibrillation: Secondary | ICD-10-CM | POA: Diagnosis not present

## 2012-01-14 DIAGNOSIS — I4891 Unspecified atrial fibrillation: Secondary | ICD-10-CM | POA: Diagnosis not present

## 2012-02-05 ENCOUNTER — Other Ambulatory Visit: Payer: Self-pay | Admitting: Cardiology

## 2012-02-14 DIAGNOSIS — I4891 Unspecified atrial fibrillation: Secondary | ICD-10-CM | POA: Diagnosis not present

## 2012-02-18 DIAGNOSIS — I4891 Unspecified atrial fibrillation: Secondary | ICD-10-CM | POA: Diagnosis not present

## 2012-03-18 DIAGNOSIS — I4891 Unspecified atrial fibrillation: Secondary | ICD-10-CM | POA: Diagnosis not present

## 2012-03-20 DIAGNOSIS — I4891 Unspecified atrial fibrillation: Secondary | ICD-10-CM | POA: Diagnosis not present

## 2012-04-07 ENCOUNTER — Ambulatory Visit (INDEPENDENT_AMBULATORY_CARE_PROVIDER_SITE_OTHER): Payer: Medicare Other | Admitting: Cardiology

## 2012-04-07 ENCOUNTER — Encounter: Payer: Self-pay | Admitting: Cardiology

## 2012-04-07 VITALS — BP 118/70 | HR 76 | Ht 63.0 in | Wt 114.5 lb

## 2012-04-07 DIAGNOSIS — Z9889 Other specified postprocedural states: Secondary | ICD-10-CM | POA: Diagnosis not present

## 2012-04-07 DIAGNOSIS — I6529 Occlusion and stenosis of unspecified carotid artery: Secondary | ICD-10-CM

## 2012-04-07 DIAGNOSIS — I4891 Unspecified atrial fibrillation: Secondary | ICD-10-CM | POA: Diagnosis not present

## 2012-04-07 DIAGNOSIS — I10 Essential (primary) hypertension: Secondary | ICD-10-CM

## 2012-04-07 NOTE — Assessment & Plan Note (Signed)
Blood pressure is controlled. No change in therapy. 

## 2012-04-07 NOTE — Assessment & Plan Note (Signed)
The patient's last Dopplers were in January, 2011. She had less than 50% bilateral disease. His time for followup Doppler and this will be arranged.

## 2012-04-07 NOTE — Assessment & Plan Note (Signed)
Atrial fib is controlled.  No change in therapy. 

## 2012-04-07 NOTE — Patient Instructions (Addendum)
Your physician recommends that you schedule a follow-up appointment in: 6 months with Dr. Myrtis Ser. You will receive a reminder letter in the mail in about 4 months reminding you to call and schedule your appointment. If you don't receive this letter, please contact our office.  Your physician recommends that you continue on your current medications as directed. Please refer to the Current Medication list given to you today.  Your physician has requested that you have a carotid duplex. This test is an ultrasound of the carotid arteries in your neck. It looks at blood flow through these arteries that supply the brain with blood. Allow one hour for this exam. There are no restrictions or special instructions.

## 2012-04-07 NOTE — Progress Notes (Signed)
HPI   The patient is seen today to followup atrial fibrillation. She's doing very well. She is very active for her age. She had spine surgery earlier this year and it was quite successful. She's doing very well. She's not having any palpitations or chest pain.   No Known Allergies  Current Outpatient Prescriptions  Medication Sig Dispense Refill  . Calcium Carbonate-Vitamin D (CALCIUM 600+D) 600-400 MG-UNIT per tablet Take 1 tablet by mouth 2 (two) times daily.        [name] diltiazem (CARDIZEM CD) 180 MG 24 hr capsule Take 180 mg by mouth daily.       . furosemide (LASIX) 20 MG tablet Take 20 mg by mouth every morning. Take 1 tablet by mouth daily.      [name] gabapentin (NEURONTIN) 100 MG capsule Take 200 mg by mouth at bedtime.       [name] HYDROcodone-acetaminophen (NORCO) 5-325 MG per tablet Take 1 tablet by mouth every 6 (six) hours as needed.      . metoprolol tartrate (LOPRESSOR) 25 MG tablet TAKE ONE TABLET BY MOUTH TWICE DAILY  60 tablet  6  . Multiple Vitamin (MULTIVITAMIN) tablet Take 1 tablet by mouth daily.        . simvastatin (ZOCOR) 20 MG tablet Take 20 mg by mouth at bedtime.       [name] warfarin (COUMADIN) 2 MG tablet Take 2 mg by mouth every other day. Take daily by mouth as directed by the [facility] with Dr. Dimas Aguas.        History   Social History  . Marital Status: Widowed    Spouse Name: N/A    Number of Children: N/A  . Years of Education: N/A   Occupational History  . Not on file.   Social History Main Topics  . Smoking status: Never Smoker   . Smokeless tobacco: Never Used  . Alcohol Use: No  . Drug Use: No  . Sexually Active: Yes    Birth Control/ Protection: Post-menopausal   Other Topics Concern  . Not on file   Social History Narrative  . No narrative on file    Family History  Problem Relation Age of Onset  . Cancer Other   . Coronary artery disease Other     Past Medical History  Diagnosis Date  . Carotid stenosis     Dopplers 2009  better than past; Dopplers 09/07/2009 <50% bilateral-stable  . Hyperlipidemia   . Hypertension   . Dyspnea   . Atrial fibrillation     DCCV prior to 04/05/2009 but reverted A.Fib, consultation w/Dr.Klein, decision to use Propafenone and   cardiovert again..cardioversion June 08, 2009.[name] on 4 days Propafenone... 100 Joules... normal sinus rhythm... Cardizem stopped.. /  return to atrial fib.[name]   /   cardionet on cardizem...09/14/2009...rates  52-190.  On Coumadin  . Chest pain     cardiac catheterization.. 2009... no significant CAD.[name] / recurrent chest pain 2010.. consider; EF 65% by Echo 04/2009  . Aortic valve sclerosis     Echo  04/2009  EF 65%.no stenosis.... echo... September, 2010  . Anxiety     May play a role with some symptoms  . Visual changes     Prior planned to have ophthalmology evlaution  . Drug therapy     coumadin  . Abnormal EKG     low voltage (no amyloid)  . Shortness of breath     Shortness of breath and fatigue June, 2012  . Back  pain     February, 2013  . GERD (gastroesophageal reflux disease)     Aspirin Stopped  . Arthritis   . H/O Spinal Surgery     Successful,  2013    Past Surgical History  Procedure Date  . Cataract extraction, bilateral   . Back surgery     X 2, most recent 2009    ROS   Patient denies fever, chills, headache, sweats, rash, change in vision, change in hearing, chest pain cough nausea or vomiting. She rarely feels slight imbalance when she is up standing. This is not a major issue. All the systems are reviewed and are negative.  PHYSICAL EXAM  Patient is oriented to person time and place. Affect is normal. There is no jugulovenous distention. Lungs are clear. Respiratory effort is nonlabored. Cardiac exam reveals S1 and S2. There are no clicks or significant murmurs heard the abdomen is soft. The rhythm is irregularly irregular. There is no edema.  Filed Vitals:   04/07/12 0945  BP: 118/70  Pulse: 76  Height: 5\' 3"  (1.6 m)    Weight: 114 lb 8 oz (51.937 kg)   EKG is done today and reviewed by me. She has low voltage. This is old. Her atrial fib rate is controlled.  ASSESSMENT & PLAN

## 2012-04-16 ENCOUNTER — Encounter (INDEPENDENT_AMBULATORY_CARE_PROVIDER_SITE_OTHER): Payer: Medicare Other

## 2012-04-16 DIAGNOSIS — I6529 Occlusion and stenosis of unspecified carotid artery: Secondary | ICD-10-CM

## 2012-04-20 DIAGNOSIS — I4891 Unspecified atrial fibrillation: Secondary | ICD-10-CM | POA: Diagnosis not present

## 2012-04-27 ENCOUNTER — Encounter: Payer: Self-pay | Admitting: Cardiology

## 2012-04-28 ENCOUNTER — Telehealth: Payer: Self-pay | Admitting: *Deleted

## 2012-04-28 NOTE — Telephone Encounter (Signed)
Patient informed. 

## 2012-04-28 NOTE — Telephone Encounter (Signed)
Message copied by Eustace Moore on Tue Apr 28, 2012  3:37 PM ------      Message from: Myrtis Ser, [place] D      Created: Mon Apr 27, 2012 11:44 AM       Carotids look good.

## 2012-05-20 DIAGNOSIS — I4891 Unspecified atrial fibrillation: Secondary | ICD-10-CM | POA: Diagnosis not present

## 2012-05-20 DIAGNOSIS — M25549 Pain in joints of unspecified hand: Secondary | ICD-10-CM | POA: Diagnosis not present

## 2012-05-26 DIAGNOSIS — I10 Essential (primary) hypertension: Secondary | ICD-10-CM | POA: Diagnosis not present

## 2012-05-26 DIAGNOSIS — E7800 Pure hypercholesterolemia, unspecified: Secondary | ICD-10-CM | POA: Diagnosis not present

## 2012-05-26 DIAGNOSIS — I4891 Unspecified atrial fibrillation: Secondary | ICD-10-CM | POA: Diagnosis not present

## 2012-05-26 DIAGNOSIS — Z23 Encounter for immunization: Secondary | ICD-10-CM | POA: Diagnosis not present

## 2012-05-26 DIAGNOSIS — G4700 Insomnia, unspecified: Secondary | ICD-10-CM | POA: Diagnosis not present

## 2012-05-26 DIAGNOSIS — M5430 Sciatica, unspecified side: Secondary | ICD-10-CM | POA: Diagnosis not present

## 2012-06-18 DIAGNOSIS — I4891 Unspecified atrial fibrillation: Secondary | ICD-10-CM | POA: Diagnosis not present

## 2012-07-20 DIAGNOSIS — I4891 Unspecified atrial fibrillation: Secondary | ICD-10-CM | POA: Diagnosis not present

## 2012-09-03 DIAGNOSIS — I4891 Unspecified atrial fibrillation: Secondary | ICD-10-CM | POA: Diagnosis not present

## 2012-10-05 DIAGNOSIS — I4891 Unspecified atrial fibrillation: Secondary | ICD-10-CM | POA: Diagnosis not present

## 2012-10-06 DIAGNOSIS — S52599A Other fractures of lower end of unspecified radius, initial encounter for closed fracture: Secondary | ICD-10-CM | POA: Diagnosis not present

## 2012-10-06 DIAGNOSIS — I10 Essential (primary) hypertension: Secondary | ICD-10-CM | POA: Diagnosis not present

## 2012-10-06 DIAGNOSIS — Z79899 Other long term (current) drug therapy: Secondary | ICD-10-CM | POA: Diagnosis not present

## 2012-10-07 DIAGNOSIS — S52599A Other fractures of lower end of unspecified radius, initial encounter for closed fracture: Secondary | ICD-10-CM | POA: Diagnosis not present

## 2012-10-07 DIAGNOSIS — S52609A Unspecified fracture of lower end of unspecified ulna, initial encounter for closed fracture: Secondary | ICD-10-CM | POA: Diagnosis not present

## 2012-10-07 DIAGNOSIS — S52509A Unspecified fracture of the lower end of unspecified radius, initial encounter for closed fracture: Secondary | ICD-10-CM | POA: Diagnosis not present

## 2012-10-12 DIAGNOSIS — S52599A Other fractures of lower end of unspecified radius, initial encounter for closed fracture: Secondary | ICD-10-CM | POA: Diagnosis not present

## 2012-10-13 ENCOUNTER — Ambulatory Visit: Payer: Medicare Other | Admitting: Cardiology

## 2012-10-13 DIAGNOSIS — Z01812 Encounter for preprocedural laboratory examination: Secondary | ICD-10-CM | POA: Diagnosis not present

## 2012-10-16 DIAGNOSIS — M25539 Pain in unspecified wrist: Secondary | ICD-10-CM | POA: Diagnosis not present

## 2012-10-16 DIAGNOSIS — G8918 Other acute postprocedural pain: Secondary | ICD-10-CM | POA: Diagnosis not present

## 2012-10-16 DIAGNOSIS — S52599A Other fractures of lower end of unspecified radius, initial encounter for closed fracture: Secondary | ICD-10-CM | POA: Diagnosis not present

## 2012-10-29 DIAGNOSIS — S5290XD Unspecified fracture of unspecified forearm, subsequent encounter for closed fracture with routine healing: Secondary | ICD-10-CM | POA: Diagnosis not present

## 2012-10-29 DIAGNOSIS — S52599A Other fractures of lower end of unspecified radius, initial encounter for closed fracture: Secondary | ICD-10-CM | POA: Diagnosis not present

## 2012-11-03 DIAGNOSIS — I4891 Unspecified atrial fibrillation: Secondary | ICD-10-CM | POA: Diagnosis not present

## 2012-11-03 DIAGNOSIS — M7130 Other bursal cyst, unspecified site: Secondary | ICD-10-CM | POA: Diagnosis not present

## 2012-11-09 ENCOUNTER — Ambulatory Visit: Payer: Medicare Other | Admitting: Cardiology

## 2012-11-19 DIAGNOSIS — S5290XD Unspecified fracture of unspecified forearm, subsequent encounter for closed fracture with routine healing: Secondary | ICD-10-CM | POA: Diagnosis not present

## 2012-11-24 DIAGNOSIS — IMO0001 Reserved for inherently not codable concepts without codable children: Secondary | ICD-10-CM | POA: Diagnosis not present

## 2012-11-24 DIAGNOSIS — M25539 Pain in unspecified wrist: Secondary | ICD-10-CM | POA: Diagnosis not present

## 2012-11-24 DIAGNOSIS — M6281 Muscle weakness (generalized): Secondary | ICD-10-CM | POA: Diagnosis not present

## 2012-11-24 DIAGNOSIS — M25549 Pain in joints of unspecified hand: Secondary | ICD-10-CM | POA: Diagnosis not present

## 2012-11-27 DIAGNOSIS — M25549 Pain in joints of unspecified hand: Secondary | ICD-10-CM | POA: Diagnosis not present

## 2012-11-27 DIAGNOSIS — M6281 Muscle weakness (generalized): Secondary | ICD-10-CM | POA: Diagnosis not present

## 2012-11-27 DIAGNOSIS — IMO0001 Reserved for inherently not codable concepts without codable children: Secondary | ICD-10-CM | POA: Diagnosis not present

## 2012-11-27 DIAGNOSIS — M25539 Pain in unspecified wrist: Secondary | ICD-10-CM | POA: Diagnosis not present

## 2012-12-01 DIAGNOSIS — I10 Essential (primary) hypertension: Secondary | ICD-10-CM | POA: Diagnosis not present

## 2012-12-01 DIAGNOSIS — I4891 Unspecified atrial fibrillation: Secondary | ICD-10-CM | POA: Diagnosis not present

## 2012-12-01 DIAGNOSIS — F411 Generalized anxiety disorder: Secondary | ICD-10-CM | POA: Diagnosis not present

## 2012-12-01 DIAGNOSIS — E7800 Pure hypercholesterolemia, unspecified: Secondary | ICD-10-CM | POA: Diagnosis not present

## 2012-12-03 DIAGNOSIS — M6281 Muscle weakness (generalized): Secondary | ICD-10-CM | POA: Diagnosis not present

## 2012-12-03 DIAGNOSIS — M25539 Pain in unspecified wrist: Secondary | ICD-10-CM | POA: Diagnosis not present

## 2012-12-03 DIAGNOSIS — IMO0001 Reserved for inherently not codable concepts without codable children: Secondary | ICD-10-CM | POA: Diagnosis not present

## 2012-12-03 DIAGNOSIS — M25549 Pain in joints of unspecified hand: Secondary | ICD-10-CM | POA: Diagnosis not present

## 2012-12-07 DIAGNOSIS — M25549 Pain in joints of unspecified hand: Secondary | ICD-10-CM | POA: Diagnosis not present

## 2012-12-07 DIAGNOSIS — M25539 Pain in unspecified wrist: Secondary | ICD-10-CM | POA: Diagnosis not present

## 2012-12-07 DIAGNOSIS — M6281 Muscle weakness (generalized): Secondary | ICD-10-CM | POA: Diagnosis not present

## 2012-12-07 DIAGNOSIS — IMO0001 Reserved for inherently not codable concepts without codable children: Secondary | ICD-10-CM | POA: Diagnosis not present

## 2012-12-08 DIAGNOSIS — I10 Essential (primary) hypertension: Secondary | ICD-10-CM | POA: Diagnosis not present

## 2012-12-08 DIAGNOSIS — I4891 Unspecified atrial fibrillation: Secondary | ICD-10-CM | POA: Diagnosis not present

## 2012-12-08 DIAGNOSIS — M5430 Sciatica, unspecified side: Secondary | ICD-10-CM | POA: Diagnosis not present

## 2012-12-08 DIAGNOSIS — G4700 Insomnia, unspecified: Secondary | ICD-10-CM | POA: Diagnosis not present

## 2012-12-08 DIAGNOSIS — E7800 Pure hypercholesterolemia, unspecified: Secondary | ICD-10-CM | POA: Diagnosis not present

## 2012-12-09 DIAGNOSIS — M25549 Pain in joints of unspecified hand: Secondary | ICD-10-CM | POA: Diagnosis not present

## 2012-12-09 DIAGNOSIS — IMO0001 Reserved for inherently not codable concepts without codable children: Secondary | ICD-10-CM | POA: Diagnosis not present

## 2012-12-09 DIAGNOSIS — M6281 Muscle weakness (generalized): Secondary | ICD-10-CM | POA: Diagnosis not present

## 2012-12-09 DIAGNOSIS — M25539 Pain in unspecified wrist: Secondary | ICD-10-CM | POA: Diagnosis not present

## 2012-12-11 ENCOUNTER — Ambulatory Visit (INDEPENDENT_AMBULATORY_CARE_PROVIDER_SITE_OTHER): Payer: Medicare Other | Admitting: Cardiology

## 2012-12-11 ENCOUNTER — Encounter: Payer: Self-pay | Admitting: Cardiology

## 2012-12-11 VITALS — BP 109/66 | HR 81 | Ht 63.0 in | Wt 115.0 lb

## 2012-12-11 DIAGNOSIS — I10 Essential (primary) hypertension: Secondary | ICD-10-CM

## 2012-12-11 DIAGNOSIS — I4891 Unspecified atrial fibrillation: Secondary | ICD-10-CM | POA: Insufficient documentation

## 2012-12-11 DIAGNOSIS — I359 Nonrheumatic aortic valve disorder, unspecified: Secondary | ICD-10-CM

## 2012-12-11 DIAGNOSIS — R079 Chest pain, unspecified: Secondary | ICD-10-CM

## 2012-12-11 DIAGNOSIS — I358 Other nonrheumatic aortic valve disorders: Secondary | ICD-10-CM

## 2012-12-11 NOTE — Assessment & Plan Note (Signed)
Her atrial fib bradycardia is controlled. No change in therapy.

## 2012-12-11 NOTE — Assessment & Plan Note (Signed)
She is not having any chest pain. No further workup.

## 2012-12-11 NOTE — Patient Instructions (Addendum)
Your physician recommends that you schedule a follow-up appointment in: 9 months. You will receive a reminder letter in the mail in about 6 months reminding you to call and schedule your appointment. If you don't receive this letter, please contact our office. Your physician recommends that you continue on your current medications as directed. Please refer to the Current Medication list given to you today. 

## 2012-12-11 NOTE — Progress Notes (Signed)
Patient ID: [name] [name], female   DOB: 15-May-1925, 79 y.o.   MRN: [id number]   HPI Patient is seen for cardiology followup. She has atrial fibrillation. She is remaining quite active. She has only rare palpitations. She is on Coumadin.  No Known Allergies  Current Outpatient Prescriptions  Medication Sig Dispense Refill  . Calcium Carbonate-Vitamin D (CALCIUM 600+D) 600-400 MG-UNIT per tablet Take 1 tablet by mouth 2 (two) times daily.        [name] diltiazem (CARDIZEM CD) 180 MG 24 hr capsule Take 180 mg by mouth daily.       . furosemide (LASIX) 20 MG tablet Take 20 mg by mouth every morning.       [name] LORazepam (ATIVAN) 1 MG tablet Take 1 tablet by mouth at bedtime as needed.      . metoprolol tartrate (LOPRESSOR) 25 MG tablet TAKE ONE TABLET BY MOUTH TWICE DAILY  60 tablet  6  . Multiple Vitamin (MULTIVITAMIN) tablet Take 1 tablet by mouth daily.        . simvastatin (ZOCOR) 20 MG tablet Take 20 mg by mouth at bedtime.       [name] warfarin (COUMADIN) 2 MG tablet Take 1 mg by mouth as directed. Take daily by mouth as directed by the [facility] with Dr. Dimas Aguas.       No current facility-administered medications for this visit.    History   Social History  . Marital Status: Widowed    Spouse Name: N/A    Number of Children: N/A  . Years of Education: N/A   Occupational History  . Not on file.   Social History Main Topics  . Smoking status: Never Smoker   . Smokeless tobacco: Never Used  . Alcohol Use: No  . Drug Use: No  . Sexually Active: Yes    Birth Control/ Protection: Post-menopausal   Other Topics Concern  . Not on file   Social History Narrative  . No narrative on file    Family History  Problem Relation Age of Onset  . Cancer Other   . Coronary artery disease Other     Past Medical History  Diagnosis Date  . Carotid stenosis     Dopplers 2009 better than past; Dopplers 09/07/2009 <50% bilateral-stable  . Hyperlipidemia   . Hypertension   .  Dyspnea   . Atrial fibrillation     DCCV prior to 04/05/2009 but reverted A.Fib, consultation w/Dr.Klein, decision to use Propafenone and   cardiovert again..cardioversion June 08, 2009.[name] on 4 days Propafenone... 100 Joules... normal sinus rhythm... Cardizem stopped.. /  return to atrial fib.[name]   /   cardionet on cardizem...09/14/2009...rates  52-190.  On Coumadin  . Chest pain     cardiac catheterization.. 2009... no significant CAD.[name] / recurrent chest pain 2010.. consider; EF 65% by Echo 04/2009  . Aortic valve sclerosis     Echo  04/2009  EF 65%.no stenosis.... echo... September, 2010  . Anxiety     May play a role with some symptoms  . Visual changes     Prior planned to have ophthalmology evlaution  . Drug therapy     coumadin  . Abnormal EKG     low voltage (no amyloid)  . Shortness of breath     Shortness of breath and fatigue June, 2012  . Back pain     February, 2013  . GERD (gastroesophageal reflux disease)     Aspirin Stopped  . Arthritis   .  H/O Spinal surgery     Successful,  2013    Past Surgical History  Procedure Laterality Date  . Cataract extraction, bilateral    . Back surgery      X 2, most recent 2009    Patient Active Problem List  Diagnosis  . VISUAL CHANGES  . GERD  . INSOMNIA  . DYSPNEA  . CHEST PAIN  . Carotid stenosis  . Hyperlipidemia  . Hypertension  . Dyspnea  . GERD (gastroesophageal reflux disease)  . Chest pain  . Aortic valve sclerosis  . Anxiety  . Visual changes  . Drug therapy  . Abnormal EKG  . Shortness of breath  . Back pain  . Spinal stenosis, lumbar region, with neurogenic claudication  . H/O Spinal Surgery    ROS  Patient denies fever, chills, headache, sweats, rash, change in vision, change in hearing, chest pain, cough, nausea vomiting, urinary symptoms. All other systems are reviewed and are negative.  PHYSICAL EXAM The patient looks quite good. She is oriented to person time and place. Affect is normal. There is  no jugulovenous distention. Lungs are clear. Respiratory effort is nonlabored. Cardiac exam reveals S1 and S2. The rhythm is irregularly irregular. The rate is controlled. The abdomen is soft. There is no peripheral edema.  Filed Vitals:   12/11/12 1451  BP: 109/66  Pulse: 81  Height: 5\' 3"  (1.6 m)  Weight: 115 lb (52.164 kg)     ASSESSMENT & PLAN

## 2012-12-11 NOTE — Assessment & Plan Note (Signed)
She has mild carotid artery disease. Her Doppler in August, 2013 revealed very mild disease. Her followup is needed in no less than 2 years

## 2012-12-11 NOTE — Assessment & Plan Note (Signed)
Blood pressure is well controlled. No change in therapy. 

## 2012-12-11 NOTE — Assessment & Plan Note (Signed)
She has some aortic valve sclerosis by history. She does not need an echo.

## 2012-12-21 DIAGNOSIS — IMO0001 Reserved for inherently not codable concepts without codable children: Secondary | ICD-10-CM | POA: Diagnosis not present

## 2012-12-21 DIAGNOSIS — M6281 Muscle weakness (generalized): Secondary | ICD-10-CM | POA: Diagnosis not present

## 2012-12-21 DIAGNOSIS — M25549 Pain in joints of unspecified hand: Secondary | ICD-10-CM | POA: Diagnosis not present

## 2012-12-21 DIAGNOSIS — M25539 Pain in unspecified wrist: Secondary | ICD-10-CM | POA: Diagnosis not present

## 2012-12-22 DIAGNOSIS — IMO0001 Reserved for inherently not codable concepts without codable children: Secondary | ICD-10-CM | POA: Diagnosis not present

## 2012-12-22 DIAGNOSIS — M6281 Muscle weakness (generalized): Secondary | ICD-10-CM | POA: Diagnosis not present

## 2012-12-22 DIAGNOSIS — M25549 Pain in joints of unspecified hand: Secondary | ICD-10-CM | POA: Diagnosis not present

## 2012-12-22 DIAGNOSIS — M25539 Pain in unspecified wrist: Secondary | ICD-10-CM | POA: Diagnosis not present

## 2012-12-28 DIAGNOSIS — M6281 Muscle weakness (generalized): Secondary | ICD-10-CM | POA: Diagnosis not present

## 2012-12-28 DIAGNOSIS — M25549 Pain in joints of unspecified hand: Secondary | ICD-10-CM | POA: Diagnosis not present

## 2012-12-28 DIAGNOSIS — I4891 Unspecified atrial fibrillation: Secondary | ICD-10-CM | POA: Diagnosis not present

## 2012-12-28 DIAGNOSIS — M25539 Pain in unspecified wrist: Secondary | ICD-10-CM | POA: Diagnosis not present

## 2012-12-28 DIAGNOSIS — IMO0001 Reserved for inherently not codable concepts without codable children: Secondary | ICD-10-CM | POA: Diagnosis not present

## 2012-12-29 DIAGNOSIS — M25549 Pain in joints of unspecified hand: Secondary | ICD-10-CM | POA: Diagnosis not present

## 2012-12-29 DIAGNOSIS — M25539 Pain in unspecified wrist: Secondary | ICD-10-CM | POA: Diagnosis not present

## 2012-12-29 DIAGNOSIS — IMO0001 Reserved for inherently not codable concepts without codable children: Secondary | ICD-10-CM | POA: Diagnosis not present

## 2012-12-29 DIAGNOSIS — M6281 Muscle weakness (generalized): Secondary | ICD-10-CM | POA: Diagnosis not present

## 2012-12-31 DIAGNOSIS — S5290XD Unspecified fracture of unspecified forearm, subsequent encounter for closed fracture with routine healing: Secondary | ICD-10-CM | POA: Diagnosis not present

## 2013-01-26 DIAGNOSIS — I4891 Unspecified atrial fibrillation: Secondary | ICD-10-CM | POA: Diagnosis not present

## 2013-02-19 DIAGNOSIS — I4891 Unspecified atrial fibrillation: Secondary | ICD-10-CM | POA: Diagnosis not present

## 2013-03-29 ENCOUNTER — Other Ambulatory Visit: Payer: Self-pay | Admitting: Cardiology

## 2013-04-01 DIAGNOSIS — I4891 Unspecified atrial fibrillation: Secondary | ICD-10-CM | POA: Diagnosis not present

## 2013-04-01 DIAGNOSIS — H4011X0 Primary open-angle glaucoma, stage unspecified: Secondary | ICD-10-CM | POA: Diagnosis not present

## 2013-04-03 DIAGNOSIS — I4891 Unspecified atrial fibrillation: Secondary | ICD-10-CM | POA: Diagnosis not present

## 2013-05-06 DIAGNOSIS — I4891 Unspecified atrial fibrillation: Secondary | ICD-10-CM | POA: Diagnosis not present

## 2013-06-10 DIAGNOSIS — Z23 Encounter for immunization: Secondary | ICD-10-CM | POA: Diagnosis not present

## 2013-06-10 DIAGNOSIS — I4891 Unspecified atrial fibrillation: Secondary | ICD-10-CM | POA: Diagnosis not present

## 2013-06-10 DIAGNOSIS — F411 Generalized anxiety disorder: Secondary | ICD-10-CM | POA: Diagnosis not present

## 2013-06-10 DIAGNOSIS — G4700 Insomnia, unspecified: Secondary | ICD-10-CM | POA: Diagnosis not present

## 2013-07-12 DIAGNOSIS — I4891 Unspecified atrial fibrillation: Secondary | ICD-10-CM | POA: Diagnosis not present

## 2013-07-12 DIAGNOSIS — H1130 Conjunctival hemorrhage, unspecified eye: Secondary | ICD-10-CM | POA: Diagnosis not present

## 2013-08-25 DIAGNOSIS — I4891 Unspecified atrial fibrillation: Secondary | ICD-10-CM | POA: Diagnosis not present

## 2013-09-08 ENCOUNTER — Ambulatory Visit (INDEPENDENT_AMBULATORY_CARE_PROVIDER_SITE_OTHER): Payer: Medicare Other | Admitting: Cardiology

## 2013-09-08 ENCOUNTER — Encounter: Payer: Self-pay | Admitting: Cardiology

## 2013-09-08 VITALS — BP 123/82 | HR 83 | Ht 63.0 in | Wt 115.1 lb

## 2013-09-08 DIAGNOSIS — I4891 Unspecified atrial fibrillation: Secondary | ICD-10-CM

## 2013-09-08 DIAGNOSIS — I10 Essential (primary) hypertension: Secondary | ICD-10-CM

## 2013-09-08 DIAGNOSIS — R079 Chest pain, unspecified: Secondary | ICD-10-CM

## 2013-09-08 NOTE — Patient Instructions (Signed)

## 2013-09-08 NOTE — Assessment & Plan Note (Signed)
Atrial fibrillation rate is controlled. She is anticoagulated. No change in therapy. 

## 2013-09-08 NOTE — Assessment & Plan Note (Signed)
She has not had any recurring chest pain. No further workup.

## 2013-09-08 NOTE — Progress Notes (Signed)
HPI  Patient is seen today to followup atrial fibrillation. She does have some palpitations from time to time. However overall she's stable. She is anticoagulated. She is remaining active. She's not having any significant chest pain.  No Known Allergies  Current Outpatient Prescriptions  Medication Sig Dispense Refill  . acetaminophen (TYLENOL) 500 MG tablet Take 500 mg by mouth as needed.      . Calcium Carbonate-Vitamin D (CALCIUM 600+D) 600-400 MG-UNIT per tablet Take 1 tablet by mouth 2 (two) times daily.        [name] diltiazem (CARDIZEM CD) 180 MG 24 hr capsule Take 180 mg by mouth daily.       . furosemide (LASIX) 20 MG tablet Take 20 mg by mouth every morning.       [name] LORazepam (ATIVAN) 1 MG tablet Take 1 tablet by mouth at bedtime as needed.      . metoprolol tartrate (LOPRESSOR) 25 MG tablet take 1/2 tab by mouth twice a day      . Multiple Vitamin (MULTIVITAMIN) tablet Take 1 tablet by mouth daily.        . simvastatin (ZOCOR) 20 MG tablet Take 20 mg by mouth at bedtime.       [name] warfarin (COUMADIN) 2 MG tablet Take 1 mg by mouth as directed. Take daily by mouth as directed by the [facility] with Dr. Nadara Mustard.       No current facility-administered medications for this visit.    History   Social History  . Marital Status: Widowed    Spouse Name: N/A    Number of Children: N/A  . Years of Education: N/A   Occupational History  . Not on file.   Social History Main Topics  . Smoking status: Never Smoker   . Smokeless tobacco: Never Used  . Alcohol Use: No  . Drug Use: No  . Sexual Activity: Yes    Birth Control/ Protection: Post-menopausal   Other Topics Concern  . Not on file   Social History Narrative  . No narrative on file    Family History  Problem Relation Age of Onset  . Cancer Other   . Coronary artery disease Other     Past Medical History  Diagnosis Date  . Carotid stenosis     Dopplers 2009 better than past; Dopplers 09/07/2009 <50%  bilateral-stable  . Hyperlipidemia   . Hypertension   . Dyspnea   . Atrial fibrillation     DCCV prior to 04/05/2009 but reverted A.Fib, consultation w/Dr.Klein, decision to use Propafenone and   cardiovert again..cardioversion June 08, 2009.[name] on 4 days Propafenone... 100 Joules... normal sinus rhythm... Cardizem stopped.. /  return to atrial fib.[name]   /   cardionet on cardizem...09/14/2009...rates  52-190.  On Coumadin  . Chest pain     cardiac catheterization.. 2009... no significant CAD.[name] / recurrent chest pain 2010.. consider; EF 65% by Echo 04/2009  . Aortic valve sclerosis     Echo  04/2009  EF 65%.no stenosis.... echo... September, 2010  . Anxiety     May play a role with some symptoms  . Visual changes     Prior planned to have ophthalmology evlaution  . Drug therapy     coumadin  . Abnormal EKG     low voltage (no amyloid)  . Shortness of breath     Shortness of breath and fatigue June, 2012  . Back pain     February, 2013  . GERD (gastroesophageal  reflux disease)     Aspirin Stopped  . Arthritis   . H/O Spinal surgery     Successful,  2013    Past Surgical History  Procedure Laterality Date  . Cataract extraction, bilateral    . Back surgery      X 2, most recent 2009    Patient Active Problem List   Diagnosis Date Noted  . Atrial fibrillation   . H/O Spinal Surgery   . Spinal stenosis, lumbar region, with neurogenic claudication 11/16/2011  . Back pain   . Shortness of breath   . Carotid stenosis   . Hyperlipidemia   . Hypertension   . Dyspnea   . GERD (gastroesophageal reflux disease)   . Chest pain   . Aortic valve sclerosis   . Anxiety   . Visual changes   . Drug therapy   . Abnormal EKG   . INSOMNIA 11/13/2009  . GERD 06/13/2009  . VISUAL CHANGES 05/11/2009  . CHEST PAIN 05/11/2009  . DYSPNEA 04/05/2009    ROS  Patient denies fever, chills, headache, sweats, rash, change in vision, change in hearing, chest pain, cough, nausea vomiting, urinary  symptoms. All other systems are reviewed and are negative.  PHYSICAL EXAM  PATIENT IS ORIENTED TO PERSON TIME AND PLACE. AFFECT IS NORMAL. SHE APPEARS STABLE. THERE IS NO JUGULOVENOUS DISTENTION. LUNGS ARE CLEAR. RESPIRATORY EFFORT IS NONLABORED. CARDIAC EXAM REVEALS S1 AND S2. THE RHYTHM IS IRREGULARLY IRREGULAR. THE ABDOMEN IS SOFT. THERE IS NO PERIPHERAL EDEMA.   Filed Vitals:   09/08/13 1108  BP: 123/82  Pulse: 83  Height: 5\' 3"  (1.6 m)  Weight: 115 lb 1.9 oz (52.218 kg)  SpO2: 98%    EKG is done today and reviewed by me. There is atrial fibrillation. The rate is adequately controlled. No change in therapy.   ASSESSMENT & PLAN

## 2013-09-08 NOTE — Assessment & Plan Note (Signed)
Blood pressure is controlled. No change in therapy. 

## 2013-10-07 DIAGNOSIS — I4891 Unspecified atrial fibrillation: Secondary | ICD-10-CM | POA: Diagnosis not present

## 2013-11-08 DIAGNOSIS — I4891 Unspecified atrial fibrillation: Secondary | ICD-10-CM | POA: Diagnosis not present

## 2013-11-23 ENCOUNTER — Other Ambulatory Visit: Payer: Self-pay | Admitting: Cardiology

## 2013-12-07 DIAGNOSIS — E7800 Pure hypercholesterolemia, unspecified: Secondary | ICD-10-CM | POA: Diagnosis not present

## 2013-12-07 DIAGNOSIS — I10 Essential (primary) hypertension: Secondary | ICD-10-CM | POA: Diagnosis not present

## 2013-12-07 DIAGNOSIS — I4891 Unspecified atrial fibrillation: Secondary | ICD-10-CM | POA: Diagnosis not present

## 2013-12-07 DIAGNOSIS — F411 Generalized anxiety disorder: Secondary | ICD-10-CM | POA: Diagnosis not present

## 2013-12-14 DIAGNOSIS — I4891 Unspecified atrial fibrillation: Secondary | ICD-10-CM | POA: Diagnosis not present

## 2013-12-14 DIAGNOSIS — G4700 Insomnia, unspecified: Secondary | ICD-10-CM | POA: Diagnosis not present

## 2013-12-14 DIAGNOSIS — F411 Generalized anxiety disorder: Secondary | ICD-10-CM | POA: Diagnosis not present

## 2013-12-14 DIAGNOSIS — E7800 Pure hypercholesterolemia, unspecified: Secondary | ICD-10-CM | POA: Diagnosis not present

## 2014-01-18 DIAGNOSIS — I4891 Unspecified atrial fibrillation: Secondary | ICD-10-CM | POA: Diagnosis not present

## 2014-02-09 DIAGNOSIS — E538 Deficiency of other specified B group vitamins: Secondary | ICD-10-CM | POA: Diagnosis not present

## 2014-02-09 DIAGNOSIS — R5383 Other fatigue: Secondary | ICD-10-CM | POA: Diagnosis not present

## 2014-02-09 DIAGNOSIS — E7800 Pure hypercholesterolemia, unspecified: Secondary | ICD-10-CM | POA: Diagnosis not present

## 2014-02-09 DIAGNOSIS — I4891 Unspecified atrial fibrillation: Secondary | ICD-10-CM | POA: Diagnosis not present

## 2014-02-09 DIAGNOSIS — R5381 Other malaise: Secondary | ICD-10-CM | POA: Diagnosis not present

## 2014-02-14 ENCOUNTER — Encounter: Payer: Self-pay | Admitting: Cardiology

## 2014-02-14 DIAGNOSIS — I499 Cardiac arrhythmia, unspecified: Secondary | ICD-10-CM | POA: Diagnosis not present

## 2014-02-14 DIAGNOSIS — I059 Rheumatic mitral valve disease, unspecified: Secondary | ICD-10-CM | POA: Diagnosis not present

## 2014-02-14 DIAGNOSIS — I509 Heart failure, unspecified: Secondary | ICD-10-CM | POA: Diagnosis not present

## 2014-03-23 DIAGNOSIS — I4891 Unspecified atrial fibrillation: Secondary | ICD-10-CM | POA: Diagnosis not present

## 2014-03-30 ENCOUNTER — Ambulatory Visit: Payer: Medicare Other | Admitting: Cardiology

## 2014-03-30 ENCOUNTER — Encounter: Payer: Self-pay | Admitting: Cardiology

## 2014-03-30 ENCOUNTER — Ambulatory Visit (INDEPENDENT_AMBULATORY_CARE_PROVIDER_SITE_OTHER): Payer: Medicare Other | Admitting: Cardiology

## 2014-03-30 VITALS — BP 96/64 | HR 88 | Ht 63.0 in | Wt 113.0 lb

## 2014-03-30 DIAGNOSIS — R0789 Other chest pain: Secondary | ICD-10-CM

## 2014-03-30 DIAGNOSIS — I4891 Unspecified atrial fibrillation: Secondary | ICD-10-CM | POA: Diagnosis not present

## 2014-03-30 NOTE — Patient Instructions (Signed)
Continue all current medications. Your physician wants you to follow up in: 6 months.  You will receive a reminder letter in the mail one-two months in advance.  If you don't receive a letter, please call our office to schedule the follow up appointment   

## 2014-03-30 NOTE — Progress Notes (Signed)
Clinical Summary [name] [name] is a 80 y.o.female regular patient of Dr Ron Parker, this is our first visit together. She is seen for the following medical problems.  1. Afib -- describes some occasional palpitaions, happens only occasionally - compliant with coumadin, denies any bleeding issues.  - compliant with dilt and metoprolol. Initially had some fatigue on metoprolol, dosed was decreased and symptoms improved.   2. Chest pain  - reports several years history of intermittent chest pain - pressure like pain in midchest, 5/10. +SOB. Can occur at rest or with exertion. No positional. Lasts for a few minutes. Occurs every other day. No relation to eating. No change in frequency or severe. - cath 2009 patent coronaries - recent repeat echo with norma LVEF, no wall motion abnormalities.    Past Medical History  Diagnosis Date  . Carotid stenosis     Dopplers 2009 better than past; Dopplers 09/07/2009 <50% bilateral-stable  . Hyperlipidemia   . Hypertension   . Dyspnea   . Atrial fibrillation     DCCV prior to 04/05/2009 but reverted A.Fib, consultation w/Dr.Klein, decision to use Propafenone and   cardiovert again..cardioversion June 08, 2009.[name] on 4 days Propafenone... 100 Joules... normal sinus rhythm... Cardizem stopped.. /  return to atrial fib.[name]   /   cardionet on cardizem...09/14/2009...rates  52-190.  On Coumadin  . Chest pain     cardiac catheterization.. 2009... no significant CAD.[name] / recurrent chest pain 2010.. consider; EF 65% by Echo 04/2009  . Aortic valve sclerosis     Echo  04/2009  EF 65%.no stenosis.... echo... September, 2010  . Anxiety     May play a role with some symptoms  . Visual changes     Prior planned to have ophthalmology evlaution  . Drug therapy     coumadin  . Abnormal EKG     low voltage (no amyloid)  . Shortness of breath     Shortness of breath and fatigue June, 2012  . Back pain     February, 2013  . GERD (gastroesophageal reflux disease)    Aspirin Stopped  . Arthritis   . H/O Spinal surgery     Successful,  2013     No Known Allergies   Current Outpatient Prescriptions  Medication Sig Dispense Refill  . acetaminophen (TYLENOL) 500 MG tablet Take 500 mg by mouth as needed.      . Calcium Carbonate-Vitamin D (CALCIUM 600+D) 600-400 MG-UNIT per tablet Take 1 tablet by mouth 2 (two) times daily.        [name] diltiazem (CARDIZEM CD) 180 MG 24 hr capsule Take 180 mg by mouth daily.       . furosemide (LASIX) 20 MG tablet Take 20 mg by mouth every morning.       [name] LORazepam (ATIVAN) 1 MG tablet Take 1 tablet by mouth at bedtime as needed.      . metoprolol tartrate (LOPRESSOR) 25 MG tablet take 1/2 tab by mouth twice a day      . metoprolol tartrate (LOPRESSOR) 25 MG tablet TAKE ONE TABLET BY MOUTH TWICE DAILY  60 tablet  6  . Multiple Vitamin (MULTIVITAMIN) tablet Take 1 tablet by mouth daily.        . simvastatin (ZOCOR) 20 MG tablet Take 20 mg by mouth at bedtime.       [name] warfarin (COUMADIN) 2 MG tablet Take 1 mg by mouth as directed. Take daily by mouth as directed by the [facility]  with Dr. Nadara Mustard.       No current facility-administered medications for this visit.     Past Surgical History  Procedure Laterality Date  . Cataract extraction, bilateral    . Back surgery      X 2, most recent 2009     No Known Allergies    Family History  Problem Relation Age of Onset  . Cancer Other   . Coronary artery disease Other      Social History Ms. Romero reports that she has never smoked. She has never used smokeless tobacco. Ms. Kilmer reports that she does not drink alcohol.   Review of Systems CONSTITUTIONAL: No weight loss, fever, chills, weakness or fatigue.  HEENT: Eyes: No visual loss, blurred vision, double vision or yellow sclerae.No hearing loss, sneezing, congestion, runny nose or sore throat.  SKIN: No rash or itching.  CARDIOVASCULAR: per HPI RESPIRATORY: No shortness of breath, cough  or sputum.  GASTROINTESTINAL: No anorexia, nausea, vomiting or diarrhea. No abdominal pain or blood.  GENITOURINARY: No burning on urination, no polyuria NEUROLOGICAL: No headache, dizziness, syncope, paralysis, ataxia, numbness or tingling in the extremities. No change in bowel or bladder control.  MUSCULOSKELETAL: No muscle, back pain, joint pain or stiffness.  LYMPHATICS: No enlarged nodes. No history of splenectomy.  PSYCHIATRIC: No history of depression or anxiety.  ENDOCRINOLOGIC: No reports of sweating, cold or heat intolerance. No polyuria or polydipsia.  [name]   Physical Examination p 88 bp 96/64 Wt 113 lbs BMI 20 Gen: resting comfortably, no acute distress HEENT: no scleral icterus, pupils equal round and reactive, no palptable cervical adenopathy,  CV: irreg, no m/r/g, no JVD Resp: Clear to auscultation bilaterally GI: abdomen is soft, non-tender, non-distended, normal bowel sounds, no hepatosplenomegaly MSK: extremities are warm, no edema.  Skin: warm, no rash Neuro:  no focal deficits Psych: appropriate affect   Diagnostic Studies 12/2007 Cath HOSPITAL COURSE: Left heart cardiac catheterization was undertaken this  morning revealing normal coronary arteries with normal LV function  without regional wall motion abnormalities.     01/2014 Echo [facility] LVEF 55-60%, moderate MR,   03/2012 Carotid US No significant stenosis  Assessment and Plan   1. Afib - fairly mild symptoms, continue current meds  2. Chest pain - long history of chest pain, unchanged. Prior negative cath in 2009 - with stable symptoms over several years, do not see strong indication for ischemic testing - discussed possible emperic antacid, however she is not interested   F/u Dr Ron Parker 6 months     Arnoldo Lenis, M.D., F.A.C.C.

## 2014-05-04 DIAGNOSIS — I4891 Unspecified atrial fibrillation: Secondary | ICD-10-CM | POA: Diagnosis not present

## 2014-06-02 DIAGNOSIS — I4891 Unspecified atrial fibrillation: Secondary | ICD-10-CM | POA: Diagnosis not present

## 2014-06-15 DIAGNOSIS — I4891 Unspecified atrial fibrillation: Secondary | ICD-10-CM | POA: Diagnosis not present

## 2014-06-15 DIAGNOSIS — F419 Anxiety disorder, unspecified: Secondary | ICD-10-CM | POA: Diagnosis not present

## 2014-06-15 DIAGNOSIS — E780 Pure hypercholesterolemia: Secondary | ICD-10-CM | POA: Diagnosis not present

## 2014-06-15 DIAGNOSIS — I10 Essential (primary) hypertension: Secondary | ICD-10-CM | POA: Diagnosis not present

## 2014-06-22 DIAGNOSIS — E780 Pure hypercholesterolemia: Secondary | ICD-10-CM | POA: Diagnosis not present

## 2014-06-22 DIAGNOSIS — I10 Essential (primary) hypertension: Secondary | ICD-10-CM | POA: Diagnosis not present

## 2014-06-22 DIAGNOSIS — I4891 Unspecified atrial fibrillation: Secondary | ICD-10-CM | POA: Diagnosis not present

## 2014-06-22 DIAGNOSIS — F419 Anxiety disorder, unspecified: Secondary | ICD-10-CM | POA: Diagnosis not present

## 2014-06-22 DIAGNOSIS — Z23 Encounter for immunization: Secondary | ICD-10-CM | POA: Diagnosis not present

## 2014-06-22 DIAGNOSIS — G4700 Insomnia, unspecified: Secondary | ICD-10-CM | POA: Diagnosis not present

## 2014-06-29 DIAGNOSIS — H40013 Open angle with borderline findings, low risk, bilateral: Secondary | ICD-10-CM | POA: Diagnosis not present

## 2014-07-14 DIAGNOSIS — K625 Hemorrhage of anus and rectum: Secondary | ICD-10-CM | POA: Diagnosis not present

## 2014-08-03 DIAGNOSIS — I482 Chronic atrial fibrillation: Secondary | ICD-10-CM | POA: Diagnosis not present

## 2014-08-08 DIAGNOSIS — K625 Hemorrhage of anus and rectum: Secondary | ICD-10-CM | POA: Diagnosis not present

## 2014-08-31 DIAGNOSIS — I482 Chronic atrial fibrillation: Secondary | ICD-10-CM | POA: Diagnosis not present

## 2014-09-22 DIAGNOSIS — Z7901 Long term (current) use of anticoagulants: Secondary | ICD-10-CM | POA: Diagnosis not present

## 2014-09-22 DIAGNOSIS — D125 Benign neoplasm of sigmoid colon: Secondary | ICD-10-CM | POA: Diagnosis not present

## 2014-09-22 DIAGNOSIS — I10 Essential (primary) hypertension: Secondary | ICD-10-CM | POA: Diagnosis not present

## 2014-09-22 DIAGNOSIS — K648 Other hemorrhoids: Secondary | ICD-10-CM | POA: Diagnosis not present

## 2014-09-22 DIAGNOSIS — K5730 Diverticulosis of large intestine without perforation or abscess without bleeding: Secondary | ICD-10-CM | POA: Diagnosis not present

## 2014-09-22 DIAGNOSIS — K644 Residual hemorrhoidal skin tags: Secondary | ICD-10-CM | POA: Diagnosis not present

## 2014-09-22 DIAGNOSIS — K219 Gastro-esophageal reflux disease without esophagitis: Secondary | ICD-10-CM | POA: Diagnosis not present

## 2014-09-22 DIAGNOSIS — F17210 Nicotine dependence, cigarettes, uncomplicated: Secondary | ICD-10-CM | POA: Diagnosis not present

## 2014-09-22 DIAGNOSIS — K625 Hemorrhage of anus and rectum: Secondary | ICD-10-CM | POA: Diagnosis not present

## 2014-09-22 DIAGNOSIS — Z79899 Other long term (current) drug therapy: Secondary | ICD-10-CM | POA: Diagnosis not present

## 2014-09-22 DIAGNOSIS — I4891 Unspecified atrial fibrillation: Secondary | ICD-10-CM | POA: Diagnosis not present

## 2014-09-22 DIAGNOSIS — Z888 Allergy status to other drugs, medicaments and biological substances status: Secondary | ICD-10-CM | POA: Diagnosis not present

## 2014-09-26 DIAGNOSIS — K635 Polyp of colon: Secondary | ICD-10-CM | POA: Diagnosis not present

## 2014-09-27 DIAGNOSIS — K635 Polyp of colon: Secondary | ICD-10-CM | POA: Diagnosis not present

## 2014-10-03 ENCOUNTER — Encounter: Payer: Self-pay | Admitting: Cardiology

## 2014-10-03 DIAGNOSIS — R9430 Abnormal result of cardiovascular function study, unspecified: Secondary | ICD-10-CM | POA: Insufficient documentation

## 2014-10-03 DIAGNOSIS — I340 Nonrheumatic mitral (valve) insufficiency: Secondary | ICD-10-CM | POA: Insufficient documentation

## 2014-10-03 DIAGNOSIS — I272 Pulmonary hypertension, unspecified: Secondary | ICD-10-CM | POA: Insufficient documentation

## 2014-10-04 DIAGNOSIS — R05 Cough: Secondary | ICD-10-CM | POA: Diagnosis not present

## 2014-10-04 DIAGNOSIS — I482 Chronic atrial fibrillation: Secondary | ICD-10-CM | POA: Diagnosis not present

## 2014-10-04 DIAGNOSIS — J019 Acute sinusitis, unspecified: Secondary | ICD-10-CM | POA: Diagnosis not present

## 2014-10-05 ENCOUNTER — Encounter: Payer: Self-pay | Admitting: Cardiology

## 2014-10-05 ENCOUNTER — Ambulatory Visit (INDEPENDENT_AMBULATORY_CARE_PROVIDER_SITE_OTHER): Payer: Medicare Other | Admitting: Cardiology

## 2014-10-05 VITALS — BP 102/68 | HR 105 | Ht 60.0 in | Wt 112.0 lb

## 2014-10-05 DIAGNOSIS — I358 Other nonrheumatic aortic valve disorders: Secondary | ICD-10-CM | POA: Diagnosis not present

## 2014-10-05 DIAGNOSIS — I10 Essential (primary) hypertension: Secondary | ICD-10-CM | POA: Diagnosis not present

## 2014-10-05 DIAGNOSIS — I482 Chronic atrial fibrillation, unspecified: Secondary | ICD-10-CM

## 2014-10-05 DIAGNOSIS — I6529 Occlusion and stenosis of unspecified carotid artery: Secondary | ICD-10-CM

## 2014-10-05 NOTE — Assessment & Plan Note (Signed)
Patient has chronic atrial fibrillation. Rate is controlled. She is anticoagulated. No further workup.

## 2014-10-05 NOTE — Assessment & Plan Note (Signed)
Patient has mild aortic valvular disease. Her last echo was June, 2015. She does not need a follow-up study now.

## 2014-10-05 NOTE — Patient Instructions (Signed)
Your physician recommends that you schedule a follow-up appointment in: 1 year with Dr. Harl Bowie. You will receive a reminder letter in the mail in about 10 months reminding you to call and schedule your appointment. If you don't receive this letter, please contact our office. Your physician recommends that you continue on your current medications as directed. Please refer to the Current Medication list given to you today. Your physician has requested that you have a carotid duplex. This test is an ultrasound of the carotid arteries in your neck. It looks at blood flow through these arteries that supply the brain with blood. Allow one hour for this exam. There are no restrictions or special instructions.

## 2014-10-05 NOTE — Progress Notes (Signed)
Cardiology Office Note   Date:  10/05/2014   ID:  [name] [name], DOB June 04, 1925, MRN [id number]  PCP:  Rory Percy, MD  Cardiologist:  Dola Argyle, MD   Chief Complaint  Patient presents with  . Appointment    Follow-up atrial fibrillation      History of Present Illness: [name] [name] is a 81 y.o. female who presents today to follow up atrial fibrillation. She is anticoagulated. She also has carotid artery disease. I had seen her last January, 2015. In June of 20152-dimensional echo was done. Ejection fraction was 55-60%. There was aortic valve sclerosis. There was moderate mitral regurgitation and a PA pressure 44 mmHg. Patient was seen in the office by Dr. Harl Bowie in August, 2015. She'd been having some intermittent chest discomfort. Consideration was given to proceeding with exercise testing. The patient preferred not to have testing. We do know that catheterization in 2009 revealed no significant coronary disease. She is not having any recurrent chest pain at this time.  Recently she's had an upper respiratory infection. She has been seen by her primary physician for this within the past few days.    Past Medical History  Diagnosis Date  . Carotid stenosis     Dopplers 2009 better than past; Dopplers 09/07/2009 <50% bilateral-stable  . Hyperlipidemia   . Hypertension   . Dyspnea   . Atrial fibrillation     DCCV prior to 04/05/2009 but reverted A.Fib, consultation w/Dr.Klein, decision to use Propafenone and   cardiovert again..cardioversion June 08, 2009.[name] on 4 days Propafenone... 100 Joules... normal sinus rhythm... Cardizem stopped.. /  return to atrial fib.[name]   /   cardionet on cardizem...09/14/2009...rates  52-190.  On Coumadin  . Chest pain     cardiac catheterization.. 2009... no significant CAD.[name] / recurrent chest pain 2010.. consider; EF 65% by Echo 04/2009  . Aortic valve sclerosis     Echo  04/2009  EF 65%.no stenosis.... echo... September, 2010  . Anxiety    May play a role with some symptoms  . Visual changes     Prior planned to have ophthalmology evlaution  . Drug therapy     coumadin  . Abnormal EKG     low voltage (no amyloid)  . Shortness of breath     Shortness of breath and fatigue June, 2012  . Back pain     February, 2013  . GERD (gastroesophageal reflux disease)     Aspirin Stopped  . Arthritis   . H/O Spinal surgery     Successful,  2013  . Ejection fraction     Past Surgical History  Procedure Laterality Date  . Cataract extraction, bilateral    . Back surgery      X 2, most recent 2009    Patient Active Problem List   Diagnosis Date Noted  . Mitral regurgitation 10/03/2014  . Pulmonary hypertension 10/03/2014  . Ejection fraction   . Atrial fibrillation   . H/O Spinal Surgery   . Spinal stenosis, lumbar region, with neurogenic claudication 11/16/2011  . Back pain   . Shortness of breath   . Carotid stenosis   . Hyperlipidemia   . Hypertension   . Dyspnea   . GERD (gastroesophageal reflux disease)   . Chest pain   . Aortic valve sclerosis   . Anxiety   . Visual changes   . Drug therapy   . Abnormal EKG   . INSOMNIA 11/13/2009  . GERD 06/13/2009  . VISUAL  CHANGES 05/11/2009  . CHEST PAIN 05/11/2009  . DYSPNEA 04/05/2009      Current Outpatient Prescriptions  Medication Sig Dispense Refill  . Calcium Carbonate-Vitamin D (CALCIUM 600+D) 600-400 MG-UNIT per tablet Take 1 tablet by mouth 2 (two) times daily.      [name] diltiazem (CARDIZEM CD) 180 MG 24 hr capsule Take 180 mg by mouth daily.     . furosemide (LASIX) 20 MG tablet Take 20 mg by mouth every morning.     [name] LORazepam (ATIVAN) 1 MG tablet Take 1 tablet by mouth at bedtime.     . metoprolol tartrate (LOPRESSOR) 25 MG tablet TAKE ONE TABLET BY MOUTH TWICE DAILY 60 tablet 6  . Multiple Vitamin (MULTIVITAMIN) tablet Take 1 tablet by mouth daily.      . simvastatin (ZOCOR) 20 MG tablet Take 20 mg by mouth at bedtime.     [name] warfarin (COUMADIN) 2 MG  tablet Take 1 mg by mouth as directed. Take daily by mouth as directed by the [facility] with Dr. Nadara Mustard.     No current facility-administered medications for this visit.    Allergies:   Tylenol    Social History:  The patient  reports that she has never smoked. She has never used smokeless tobacco. She reports that she does not drink alcohol or use illicit drugs.   Family History:  The patient's family history includes Cancer in her other; Coronary artery disease in her other.    ROS:  Please see the history of present illness. Patient denies fever, chills, headache, sweats, rash, change in vision, change in hearing, chest pain,, nausea vomiting, urinary symptoms.    All other systems are reviewed and are negative.   PHYSICAL EXAM: VS:  BP 102/68 mmHg  Pulse 105  Ht 5' (1.524 m)  Wt 112 lb (50.803 kg)  BMI 21.87 kg/m2  SpO2 98% , Patient is oriented to person time and place. Affect is normal. Head is atraumatic. Sclera and conjunctiva are normal. There is no jugulovenous distention. Lungs are clear. Respiratory effort is nonlabored. Cardiac exam reveals an S1 and S2. There is a soft systolic murmur. The abdomen is soft. There is no peripheral edema. There are no musculoskeletal deformities. There are no skin rashes. Neurologic is grossly intact.  EKG:   EKG is done today and reviewed by me. There is no change from the past. There is atrial fibrillation. There is no decreased R-wave in lead V2.   Recent Labs: No results found for requested labs within last 365 days.    Lipid Panel    Component Value Date/Time   CHOL  01/14/2008 0230    195        ATP III CLASSIFICATION:  <200     mg/dL   Desirable  200-239  mg/dL   Borderline High  >=240    mg/dL   High   TRIG 52 01/14/2008 0230   HDL 82 01/14/2008 0230   CHOLHDL 2.4 01/14/2008 0230   VLDL 10 01/14/2008 0230   LDLCALC * 01/14/2008 0230    103        Total Cholesterol/HDL:CHD Risk Coronary Heart Disease Risk  Table                     Men   Women  1/2 Average Risk   3.4   3.3      Wt Readings from Last 3 Encounters:  10/05/14 112 lb (50.803 kg)  03/30/14 113 lb (51.256  kg)  09/08/13 115 lb 1.9 oz (52.218 kg)      Current medicines are reviewed. Patient understands her medications.       ASSESSMENT AND PLAN:

## 2014-10-05 NOTE — Assessment & Plan Note (Signed)
We are arranging for a follow-up carotid Doppler. If this study shows no significant changes, we will probably not need to do Dopplers in the future.  The patient will be seen for cardiology follow-up in our office in 1 year. She saw Dr. Harl Bowie in August, 2015. We will make the follow-up appointment with him.

## 2014-10-13 ENCOUNTER — Encounter (INDEPENDENT_AMBULATORY_CARE_PROVIDER_SITE_OTHER): Payer: Medicare Other

## 2014-10-13 DIAGNOSIS — I6523 Occlusion and stenosis of bilateral carotid arteries: Secondary | ICD-10-CM | POA: Diagnosis not present

## 2014-10-13 DIAGNOSIS — I6529 Occlusion and stenosis of unspecified carotid artery: Secondary | ICD-10-CM

## 2014-10-19 DIAGNOSIS — I10 Essential (primary) hypertension: Secondary | ICD-10-CM | POA: Diagnosis not present

## 2014-10-19 DIAGNOSIS — Z1389 Encounter for screening for other disorder: Secondary | ICD-10-CM | POA: Diagnosis not present

## 2014-10-19 DIAGNOSIS — E780 Pure hypercholesterolemia: Secondary | ICD-10-CM | POA: Diagnosis not present

## 2014-10-19 DIAGNOSIS — M545 Low back pain: Secondary | ICD-10-CM | POA: Diagnosis not present

## 2014-10-19 DIAGNOSIS — I482 Chronic atrial fibrillation: Secondary | ICD-10-CM | POA: Diagnosis not present

## 2014-10-24 ENCOUNTER — Telehealth: Payer: Self-pay | Admitting: *Deleted

## 2014-10-24 DIAGNOSIS — I10 Essential (primary) hypertension: Secondary | ICD-10-CM | POA: Diagnosis not present

## 2014-10-24 DIAGNOSIS — I482 Chronic atrial fibrillation: Secondary | ICD-10-CM | POA: Diagnosis not present

## 2014-10-24 DIAGNOSIS — K047 Periapical abscess without sinus: Secondary | ICD-10-CM | POA: Diagnosis not present

## 2014-10-24 NOTE — Telephone Encounter (Signed)
-----   Message from Carlena Bjornstad, MD sent at 10/20/2014  2:23 PM EST ----- Please let her know that her Doppler is very stable. His been no change.

## 2014-10-24 NOTE — Telephone Encounter (Signed)
Patient informed. 

## 2014-10-27 DIAGNOSIS — I482 Chronic atrial fibrillation: Secondary | ICD-10-CM | POA: Diagnosis not present

## 2014-11-01 DIAGNOSIS — I482 Chronic atrial fibrillation: Secondary | ICD-10-CM | POA: Diagnosis not present

## 2014-11-07 DIAGNOSIS — I482 Chronic atrial fibrillation: Secondary | ICD-10-CM | POA: Diagnosis not present

## 2014-11-14 DIAGNOSIS — I482 Chronic atrial fibrillation: Secondary | ICD-10-CM | POA: Diagnosis not present

## 2014-12-15 DIAGNOSIS — I482 Chronic atrial fibrillation: Secondary | ICD-10-CM | POA: Diagnosis not present

## 2014-12-19 DIAGNOSIS — I10 Essential (primary) hypertension: Secondary | ICD-10-CM | POA: Diagnosis not present

## 2014-12-19 DIAGNOSIS — I482 Chronic atrial fibrillation: Secondary | ICD-10-CM | POA: Diagnosis not present

## 2014-12-19 DIAGNOSIS — R531 Weakness: Secondary | ICD-10-CM | POA: Diagnosis not present

## 2015-01-16 DIAGNOSIS — H3531 Nonexudative age-related macular degeneration: Secondary | ICD-10-CM | POA: Diagnosis not present

## 2015-01-30 DIAGNOSIS — I482 Chronic atrial fibrillation: Secondary | ICD-10-CM | POA: Diagnosis not present

## 2015-03-07 DIAGNOSIS — D518 Other vitamin B12 deficiency anemias: Secondary | ICD-10-CM | POA: Diagnosis not present

## 2015-03-07 DIAGNOSIS — R531 Weakness: Secondary | ICD-10-CM | POA: Diagnosis not present

## 2015-03-07 DIAGNOSIS — I482 Chronic atrial fibrillation: Secondary | ICD-10-CM | POA: Diagnosis not present

## 2015-03-07 DIAGNOSIS — R599 Enlarged lymph nodes, unspecified: Secondary | ICD-10-CM | POA: Diagnosis not present

## 2015-03-07 DIAGNOSIS — M542 Cervicalgia: Secondary | ICD-10-CM | POA: Diagnosis not present

## 2015-04-18 DIAGNOSIS — I482 Chronic atrial fibrillation: Secondary | ICD-10-CM | POA: Diagnosis not present

## 2015-05-01 DIAGNOSIS — M25552 Pain in left hip: Secondary | ICD-10-CM | POA: Diagnosis not present

## 2015-05-02 DIAGNOSIS — M25552 Pain in left hip: Secondary | ICD-10-CM | POA: Diagnosis not present

## 2015-05-04 DIAGNOSIS — K5730 Diverticulosis of large intestine without perforation or abscess without bleeding: Secondary | ICD-10-CM | POA: Diagnosis not present

## 2015-05-04 DIAGNOSIS — M25552 Pain in left hip: Secondary | ICD-10-CM | POA: Diagnosis not present

## 2015-05-04 DIAGNOSIS — R188 Other ascites: Secondary | ICD-10-CM | POA: Diagnosis not present

## 2015-05-04 DIAGNOSIS — M1612 Unilateral primary osteoarthritis, left hip: Secondary | ICD-10-CM | POA: Diagnosis not present

## 2015-05-04 DIAGNOSIS — M76892 Other specified enthesopathies of left lower limb, excluding foot: Secondary | ICD-10-CM | POA: Diagnosis not present

## 2015-05-09 DIAGNOSIS — M6790 Unspecified disorder of synovium and tendon, unspecified site: Secondary | ICD-10-CM | POA: Diagnosis not present

## 2015-05-30 DIAGNOSIS — I482 Chronic atrial fibrillation: Secondary | ICD-10-CM | POA: Diagnosis not present

## 2015-06-07 DIAGNOSIS — I482 Chronic atrial fibrillation: Secondary | ICD-10-CM | POA: Diagnosis not present

## 2015-06-07 DIAGNOSIS — I10 Essential (primary) hypertension: Secondary | ICD-10-CM | POA: Diagnosis not present

## 2015-06-07 DIAGNOSIS — E7800 Pure hypercholesterolemia, unspecified: Secondary | ICD-10-CM | POA: Diagnosis not present

## 2015-06-15 DIAGNOSIS — I482 Chronic atrial fibrillation: Secondary | ICD-10-CM | POA: Diagnosis not present

## 2015-06-15 DIAGNOSIS — R531 Weakness: Secondary | ICD-10-CM | POA: Diagnosis not present

## 2015-06-15 DIAGNOSIS — I10 Essential (primary) hypertension: Secondary | ICD-10-CM | POA: Diagnosis not present

## 2015-06-15 DIAGNOSIS — M545 Low back pain: Secondary | ICD-10-CM | POA: Diagnosis not present

## 2015-06-15 DIAGNOSIS — Z23 Encounter for immunization: Secondary | ICD-10-CM | POA: Diagnosis not present

## 2015-06-15 DIAGNOSIS — M67352 Transient synovitis, left hip: Secondary | ICD-10-CM | POA: Diagnosis not present

## 2015-07-11 DIAGNOSIS — I482 Chronic atrial fibrillation: Secondary | ICD-10-CM | POA: Diagnosis not present

## 2015-07-12 DIAGNOSIS — H353110 Nonexudative age-related macular degeneration, right eye, stage unspecified: Secondary | ICD-10-CM | POA: Diagnosis not present

## 2015-07-31 DIAGNOSIS — I482 Chronic atrial fibrillation: Secondary | ICD-10-CM | POA: Diagnosis not present

## 2015-08-10 DIAGNOSIS — I482 Chronic atrial fibrillation: Secondary | ICD-10-CM | POA: Diagnosis not present

## 2015-08-25 DIAGNOSIS — J0101 Acute recurrent maxillary sinusitis: Secondary | ICD-10-CM | POA: Diagnosis not present

## 2015-09-07 DIAGNOSIS — I482 Chronic atrial fibrillation: Secondary | ICD-10-CM | POA: Diagnosis not present

## 2015-09-27 ENCOUNTER — Encounter: Payer: Self-pay | Admitting: Cardiology

## 2015-09-27 ENCOUNTER — Encounter: Payer: Self-pay | Admitting: *Deleted

## 2015-09-27 ENCOUNTER — Ambulatory Visit (INDEPENDENT_AMBULATORY_CARE_PROVIDER_SITE_OTHER): Payer: Medicare Other | Admitting: Cardiology

## 2015-09-27 VITALS — BP 127/63 | HR 76 | Ht 63.0 in | Wt 117.0 lb

## 2015-09-27 DIAGNOSIS — E785 Hyperlipidemia, unspecified: Secondary | ICD-10-CM

## 2015-09-27 DIAGNOSIS — I10 Essential (primary) hypertension: Secondary | ICD-10-CM | POA: Diagnosis not present

## 2015-09-27 DIAGNOSIS — I482 Chronic atrial fibrillation, unspecified: Secondary | ICD-10-CM

## 2015-09-27 DIAGNOSIS — R0789 Other chest pain: Secondary | ICD-10-CM

## 2015-09-27 DIAGNOSIS — I6523 Occlusion and stenosis of bilateral carotid arteries: Secondary | ICD-10-CM | POA: Diagnosis not present

## 2015-09-27 NOTE — Progress Notes (Addendum)
Patient ID: [name] [name], female   DOB: 07/20/1925, 82 y.o.   MRN: [id number]     Clinical Summary [name] [name] is an 82 y.o.female seen today for follow up of the following medical problems.   1. Afib - notes some occasional palpitations. Mainly occurs night. Overally not very bothersome.  - denies any bleeding troubles on coumadin, followed by Dr Nadara Mustard. She has not been interested in NOACs.   2. Chest pain  - reports several years history of intermittent chest pain - cath 2009 patent coronaries - recent repeat echo with normal LVEF, no wall motion abnormalities. - no recent symptoms  3. Carotid stenosis - 09/2014 carotid US with stable bilateral 1-39% stenosis.  - no recent neuro symptoms  4. Hyperlipidemia - no recent panel in our system -compliant with statin   Past Medical History  Diagnosis Date  . Carotid stenosis     Dopplers 2009 better than past; Dopplers 09/07/2009 <50% bilateral-stable  . Hyperlipidemia   . Hypertension   . Dyspnea   . Atrial fibrillation     DCCV prior to 04/05/2009 but reverted A.Fib, consultation w/Dr.Klein, decision to use Propafenone and   cardiovert again..cardioversion June 08, 2009.[name] on 4 days Propafenone... 100 Joules... normal sinus rhythm... Cardizem stopped.. /  return to atrial fib.[name]   /   cardionet on cardizem...09/14/2009...rates  52-190.  On Coumadin  . Chest pain     cardiac catheterization.. 2009... no significant CAD.[name] / recurrent chest pain 2010.. consider; EF 65% by Echo 04/2009  . Aortic valve sclerosis     Echo  04/2009  EF 65%.no stenosis.... echo... September, 2010  . Anxiety     May play a role with some symptoms  . Visual changes     Prior planned to have ophthalmology evlaution  . Drug therapy     coumadin  . Abnormal EKG     low voltage (no amyloid)  . Shortness of breath     Shortness of breath and fatigue June, 2012  . Back pain     February, 2013  . GERD (gastroesophageal reflux disease)     Aspirin  Stopped  . Arthritis   . H/O Spinal surgery     Successful,  2013  . Ejection fraction      Allergies  Allergen Reactions  . Tylenol [Acetaminophen]     blisters     Current Outpatient Prescriptions  Medication Sig Dispense Refill  . Calcium Carbonate-Vitamin D (CALCIUM 600+D) 600-400 MG-UNIT per tablet Take 1 tablet by mouth 2 (two) times daily.      [name] diltiazem (CARDIZEM CD) 180 MG 24 hr capsule Take 180 mg by mouth daily.     . furosemide (LASIX) 20 MG tablet Take 20 mg by mouth every morning.     [name] LORazepam (ATIVAN) 1 MG tablet Take 1 tablet by mouth at bedtime.     . metoprolol tartrate (LOPRESSOR) 25 MG tablet TAKE ONE TABLET BY MOUTH TWICE DAILY 60 tablet 6  . Multiple Vitamin (MULTIVITAMIN) tablet Take 1 tablet by mouth daily.      . simvastatin (ZOCOR) 20 MG tablet Take 20 mg by mouth at bedtime.     [name] warfarin (COUMADIN) 2 MG tablet Take 1 mg by mouth as directed. Take daily by mouth as directed by the [facility] with Dr. Nadara Mustard.     No current facility-administered medications for this visit.     Past Surgical History  Procedure Laterality Date  . Cataract extraction, bilateral    .  Back surgery      X 2, most recent 2009     Allergies  Allergen Reactions  . Tylenol [Acetaminophen]     blisters      Family History  Problem Relation Age of Onset  . Cancer Other   . Coronary artery disease Other      Social History [name] [name] reports that she has never smoked. She has never used smokeless tobacco. [name] [name] reports that she does not drink alcohol.   Review of Systems CONSTITUTIONAL: No weight loss, fever, chills, weakness or fatigue.  HEENT: Eyes: No visual loss, blurred vision, double vision or yellow sclerae.No hearing loss, sneezing, congestion, runny nose or sore throat.  SKIN: No rash or itching.  CARDIOVASCULAR: per HPI RESPIRATORY: No shortness of breath, cough or sputum.  GASTROINTESTINAL: No anorexia, nausea, vomiting  or diarrhea. No abdominal pain or blood.  GENITOURINARY: No burning on urination, no polyuria NEUROLOGICAL: No headache, dizziness, syncope, paralysis, ataxia, numbness or tingling in the extremities. No change in bowel or bladder control.  MUSCULOSKELETAL: No muscle, back pain, joint pain or stiffness.  LYMPHATICS: No enlarged nodes. No history of splenectomy.  PSYCHIATRIC: No history of depression or anxiety.  ENDOCRINOLOGIC: No reports of sweating, cold or heat intolerance. No polyuria or polydipsia.  [name]   Physical Examination Filed Vitals:   09/27/15 1057  BP: 127/63  Pulse: 76   Filed Vitals:   09/27/15 1057  Height: 5\' 3"  (1.6 m)  Weight: 117 lb (53.071 kg)    Gen: resting comfortably, no acute distress HEENT: no scleral icterus, pupils equal round and reactive, no palptable cervical adenopathy,  CV: irreg, no m/r/g, no jvd Resp: Clear to auscultation bilaterally GI: abdomen is soft, non-tender, non-distended, normal bowel sounds, no hepatosplenomegaly MSK: extremities are warm, no edema.  Skin: warm, no rash Neuro:  no focal deficits Psych: appropriate affect   Diagnostic Studies 12/2007 Cath HOSPITAL COURSE: Left heart cardiac catheterization was undertaken this  morning revealing normal coronary arteries with normal LV function  without regional wall motion abnormalities.     01/2014 Echo [facility] LVEF 55-60%, moderate MR,   03/2012 Carotid US No significant stenosis  09/2014 Carotid US 1-39% bilateral disease  09/27/15 Clinic EKG (performed and reviewed in clinic): afib rate 70, low voltage  Assessment and Plan   1. Afib  - fairly mild symptoms, overall doing well. Will continue current meds - discussed NOACs in detail today, she wishes to remain on coumadin     2. Chest pain  - long history of chest pain, unchanged. Prior negative cath in 2009  - with stable symptoms over several years, do not see strong indication for ischemic testing  - continue  to follow clinically  3. Carotid stenosis - no symptoms. Mild by last few checks, at 82 years old with stable mild disease will not repeat study this year  4. Hyperlipidemia - continue statin, request labs from pcp  5. HTN - at goal, continue current meds  F/u 6 months  Arnoldo Lenis, M.D., F.A.C.C.

## 2015-09-27 NOTE — Patient Instructions (Signed)

## 2015-10-25 DIAGNOSIS — I482 Chronic atrial fibrillation: Secondary | ICD-10-CM | POA: Diagnosis not present

## 2015-11-27 DIAGNOSIS — I482 Chronic atrial fibrillation: Secondary | ICD-10-CM | POA: Diagnosis not present

## 2015-12-18 DIAGNOSIS — Z13220 Encounter for screening for lipoid disorders: Secondary | ICD-10-CM | POA: Diagnosis not present

## 2015-12-18 DIAGNOSIS — I10 Essential (primary) hypertension: Secondary | ICD-10-CM | POA: Diagnosis not present

## 2015-12-18 DIAGNOSIS — I482 Chronic atrial fibrillation: Secondary | ICD-10-CM | POA: Diagnosis not present

## 2015-12-18 DIAGNOSIS — F419 Anxiety disorder, unspecified: Secondary | ICD-10-CM | POA: Diagnosis not present

## 2015-12-18 DIAGNOSIS — R531 Weakness: Secondary | ICD-10-CM | POA: Diagnosis not present

## 2015-12-18 DIAGNOSIS — R599 Enlarged lymph nodes, unspecified: Secondary | ICD-10-CM | POA: Diagnosis not present

## 2015-12-18 DIAGNOSIS — E7800 Pure hypercholesterolemia, unspecified: Secondary | ICD-10-CM | POA: Diagnosis not present

## 2015-12-21 DIAGNOSIS — F419 Anxiety disorder, unspecified: Secondary | ICD-10-CM | POA: Diagnosis not present

## 2015-12-21 DIAGNOSIS — G4700 Insomnia, unspecified: Secondary | ICD-10-CM | POA: Diagnosis not present

## 2015-12-21 DIAGNOSIS — I482 Chronic atrial fibrillation: Secondary | ICD-10-CM | POA: Diagnosis not present

## 2015-12-21 DIAGNOSIS — I10 Essential (primary) hypertension: Secondary | ICD-10-CM | POA: Diagnosis not present

## 2015-12-21 DIAGNOSIS — M545 Low back pain: Secondary | ICD-10-CM | POA: Diagnosis not present

## 2016-01-08 DIAGNOSIS — I482 Chronic atrial fibrillation: Secondary | ICD-10-CM | POA: Diagnosis not present

## 2016-02-08 DIAGNOSIS — I482 Chronic atrial fibrillation: Secondary | ICD-10-CM | POA: Diagnosis not present

## 2016-02-16 DIAGNOSIS — I482 Chronic atrial fibrillation: Secondary | ICD-10-CM | POA: Diagnosis not present

## 2016-02-16 DIAGNOSIS — F419 Anxiety disorder, unspecified: Secondary | ICD-10-CM | POA: Diagnosis not present

## 2016-02-16 DIAGNOSIS — I10 Essential (primary) hypertension: Secondary | ICD-10-CM | POA: Diagnosis not present

## 2016-02-16 DIAGNOSIS — R531 Weakness: Secondary | ICD-10-CM | POA: Diagnosis not present

## 2016-02-22 DIAGNOSIS — I509 Heart failure, unspecified: Secondary | ICD-10-CM | POA: Diagnosis not present

## 2016-02-22 DIAGNOSIS — I080 Rheumatic disorders of both mitral and aortic valves: Secondary | ICD-10-CM | POA: Diagnosis not present

## 2016-02-22 DIAGNOSIS — I4891 Unspecified atrial fibrillation: Secondary | ICD-10-CM | POA: Diagnosis not present

## 2016-02-22 DIAGNOSIS — I517 Cardiomegaly: Secondary | ICD-10-CM | POA: Diagnosis not present

## 2016-02-22 DIAGNOSIS — R931 Abnormal findings on diagnostic imaging of heart and coronary circulation: Secondary | ICD-10-CM | POA: Diagnosis not present

## 2016-03-08 DIAGNOSIS — I482 Chronic atrial fibrillation: Secondary | ICD-10-CM | POA: Diagnosis not present

## 2016-03-27 ENCOUNTER — Ambulatory Visit (INDEPENDENT_AMBULATORY_CARE_PROVIDER_SITE_OTHER): Payer: Medicare Other | Admitting: Cardiology

## 2016-03-27 ENCOUNTER — Encounter: Payer: Self-pay | Admitting: Cardiology

## 2016-03-27 ENCOUNTER — Encounter: Payer: Self-pay | Admitting: *Deleted

## 2016-03-27 VITALS — BP 117/72 | HR 60 | Ht 63.0 in | Wt 118.0 lb

## 2016-03-27 DIAGNOSIS — E785 Hyperlipidemia, unspecified: Secondary | ICD-10-CM

## 2016-03-27 DIAGNOSIS — R0789 Other chest pain: Secondary | ICD-10-CM

## 2016-03-27 DIAGNOSIS — I4891 Unspecified atrial fibrillation: Secondary | ICD-10-CM | POA: Diagnosis not present

## 2016-03-27 DIAGNOSIS — I6523 Occlusion and stenosis of bilateral carotid arteries: Secondary | ICD-10-CM | POA: Diagnosis not present

## 2016-03-27 DIAGNOSIS — I10 Essential (primary) hypertension: Secondary | ICD-10-CM

## 2016-03-27 NOTE — Progress Notes (Signed)
Clinical Summary Ms. Faires is a 82 y.o.female seen today for follow up of the following medical problems.   1. Afib - notes some occasional palpitations. Mainly occurs night. Overally not very bothersome.  - denies any bleeding troubles on coumadin, followed by Dr Nadara Mustard. She has not been interested in NOACs.   - very mild rare palpitations at times.  - no bleeding troubles on coumadin.   2. Chest pain  - reports several years history of intermittent chest pain - cath 2009 patent coronaries - recent repeat echo with normal LVEF, no wall motion abnormalities.   - no recurrent symptoms since last visit  3. Carotid stenosis - 09/2014 carotid US with stable bilateral 1-39% stenosis.  - no recent neuro symptoms  4. Hyperlipidemia - 05/2015 TC 182 TG 93 HDL 102 LDL 61 -she is compliant with statin  5. Generalized fatigue - started about 6 months ago - can have weakness and fatigue with walking.  Past Medical History:  Diagnosis Date  . Abnormal EKG    low voltage (no amyloid)  . Anxiety    May play a role with some symptoms  . Aortic valve sclerosis    Echo  04/2009  EF 65%.no stenosis.... echo... September, 2010  . Arthritis   . Atrial fibrillation ([place])    DCCV prior to 04/05/2009 but reverted A.Fib, consultation w/Dr.Klein, decision to use Propafenone and   cardiovert again..cardioversion June 08, 2009.[name] on 4 days Propafenone... 100 Joules... normal sinus rhythm... Cardizem stopped.. /  return to atrial fib.[name]   /   cardionet on cardizem...09/14/2009...rates  52-190.  On Coumadin  . Back pain    February, 2013  . Carotid stenosis    Dopplers 2009 better than past; Dopplers 09/07/2009 <50% bilateral-stable  . Chest pain    cardiac catheterization.. 2009... no significant CAD.[name] / recurrent chest pain 2010.. consider; EF 65% by Echo 04/2009  . Drug therapy    coumadin  . Dyspnea   . Ejection fraction   . GERD (gastroesophageal reflux disease)    Aspirin Stopped    . H/O Spinal surgery    Successful,  2013  . Hyperlipidemia   . Hypertension   . Shortness of breath    Shortness of breath and fatigue June, 2012  . Visual changes    Prior planned to have ophthalmology evlaution     Allergies  Allergen Reactions  . Tylenol [Acetaminophen]     blisters     Current Outpatient Prescriptions  Medication Sig Dispense Refill  . Calcium Carbonate-Vitamin D (CALCIUM 600+D) 600-400 MG-UNIT per tablet Take 1 tablet by mouth 2 (two) times daily.      [name] diltiazem (CARDIZEM CD) 180 MG 24 hr capsule Take 180 mg by mouth daily.     . furosemide (LASIX) 20 MG tablet Take 20 mg by mouth every morning.     . metoprolol tartrate (LOPRESSOR) 25 MG tablet TAKE ONE TABLET BY MOUTH TWICE DAILY 60 tablet 6  . Multiple Vitamin (MULTIVITAMIN) tablet Take 1 tablet by mouth daily.      . simvastatin (ZOCOR) 20 MG tablet Take 20 mg by mouth at bedtime.     . traMADol (ULTRAM) 50 MG tablet Take 25 mg by mouth every 12 (twelve) hours as needed.    . warfarin (COUMADIN) 1 MG tablet Take 1 tablet by mouth as directed.  0   No current facility-administered medications for this visit.      Past Surgical History:  Procedure  Laterality Date  . BACK SURGERY     X 2, most recent 2009  . CATARACT EXTRACTION, BILATERAL       Allergies  Allergen Reactions  . Tylenol [Acetaminophen]     blisters      Family History  Problem Relation Age of Onset  . Cancer Other   . Coronary artery disease Other      Social History Ms. Vonderhaar reports that she has never smoked. She has never used smokeless tobacco. Ms. Greany reports that she does not drink alcohol.   Review of Systems CONSTITUTIONAL: +fatigue HEENT: Eyes: No visual loss, blurred vision, double vision or yellow sclerae.No hearing loss, sneezing, congestion, runny nose or sore throat.  SKIN: No rash or itching.  CARDIOVASCULAR: per HPI RESPIRATORY: No shortness of breath, cough or sputum.   GASTROINTESTINAL: No anorexia, nausea, vomiting or diarrhea. No abdominal pain or blood.  GENITOURINARY: No burning on urination, no polyuria NEUROLOGICAL: No headache, dizziness, syncope, paralysis, ataxia, numbness or tingling in the extremities. No change in bowel or bladder control.  MUSCULOSKELETAL: No muscle, back pain, joint pain or stiffness.  LYMPHATICS: No enlarged nodes. No history of splenectomy.  PSYCHIATRIC: No history of depression or anxiety.  ENDOCRINOLOGIC: No reports of sweating, cold or heat intolerance. No polyuria or polydipsia.  [name]   Physical Examination Vitals:   03/27/16 1033  BP: 117/72  Pulse: 60   Vitals:   03/27/16 1033  Weight: 118 lb (53.5 kg)  Height: 5\' 3"  (1.6 m)    Gen: resting comfortably, no acute distress HEENT: no scleral icterus, pupils equal round and reactive, no palptable cervical adenopathy,  CV: irreg no m/r/g, no jvd Resp: Clear to auscultation bilaterally GI: abdomen is soft, non-tender, non-distended, normal bowel sounds, no hepatosplenomegaly MSK: extremities are warm, no edema.  Skin: warm, no rash Neuro:  no focal deficits Psych: appropriate affect   Diagnostic Studies 12/2007 Cath HOSPITAL COURSE: Left heart cardiac catheterization was undertaken this  morning revealing normal coronary arteries with normal LV function  without regional wall motion abnormalities.     01/2014 Echo [facility] LVEF 55-60%, moderate MR,   03/2012 Carotid US No significant stenosis  09/2014 Carotid US 1-39% bilateral disease     Assessment and Plan  1. Afib  - fairly mild symptoms, we will continue current meds - CHADS2Vasc score is 4, continue anticoagulation.     2. Chest pain  - long history of chest pain, unchanged. Prior negative cath in 2009  - no recent symptoms, we will continue to monitor  3. Carotid stenosis - no symptoms. Mild by last few checks, at 82 years old with stable mild disease will not repeat study  at this time  4. Hyperlipidemia - we will continue statin  5. HTN - at goal, she will continue current meds   6. Fatigue - unclear etiology, we will request results of recent echo by pcp and labs. No specific cardiopulmonary symptoms.    Arnoldo Lenis, M.D.

## 2016-03-27 NOTE — Patient Instructions (Signed)
Your physician recommends that you schedule a follow-up appointment in: 3 MONTHS WITH DR. [place]  Your physician recommends that you continue on your current medications as directed. Please refer to the Current Medication list given to you today.  WE WILL REQUEST ECHOCARDIOGRAM AND LAB RESULTS FROM YOUR PCP  Thank you for choosing [facility]!!

## 2016-04-11 DIAGNOSIS — I482 Chronic atrial fibrillation: Secondary | ICD-10-CM | POA: Diagnosis not present

## 2016-05-23 DIAGNOSIS — I482 Chronic atrial fibrillation: Secondary | ICD-10-CM | POA: Diagnosis not present

## 2016-06-14 DIAGNOSIS — R599 Enlarged lymph nodes, unspecified: Secondary | ICD-10-CM | POA: Diagnosis not present

## 2016-06-14 DIAGNOSIS — I10 Essential (primary) hypertension: Secondary | ICD-10-CM | POA: Diagnosis not present

## 2016-06-14 DIAGNOSIS — E7800 Pure hypercholesterolemia, unspecified: Secondary | ICD-10-CM | POA: Diagnosis not present

## 2016-06-14 DIAGNOSIS — I482 Chronic atrial fibrillation: Secondary | ICD-10-CM | POA: Diagnosis not present

## 2016-06-18 DIAGNOSIS — I10 Essential (primary) hypertension: Secondary | ICD-10-CM | POA: Diagnosis not present

## 2016-06-18 DIAGNOSIS — G4700 Insomnia, unspecified: Secondary | ICD-10-CM | POA: Diagnosis not present

## 2016-06-18 DIAGNOSIS — I482 Chronic atrial fibrillation: Secondary | ICD-10-CM | POA: Diagnosis not present

## 2016-06-18 DIAGNOSIS — E7800 Pure hypercholesterolemia, unspecified: Secondary | ICD-10-CM | POA: Diagnosis not present

## 2016-06-18 DIAGNOSIS — Z23 Encounter for immunization: Secondary | ICD-10-CM | POA: Diagnosis not present

## 2016-06-18 DIAGNOSIS — Z0001 Encounter for general adult medical examination with abnormal findings: Secondary | ICD-10-CM | POA: Diagnosis not present

## 2016-06-18 DIAGNOSIS — M545 Low back pain: Secondary | ICD-10-CM | POA: Diagnosis not present

## 2016-06-18 DIAGNOSIS — F419 Anxiety disorder, unspecified: Secondary | ICD-10-CM | POA: Diagnosis not present

## 2016-07-17 ENCOUNTER — Ambulatory Visit: Payer: Medicare Other | Admitting: Cardiology

## 2016-07-29 DIAGNOSIS — I482 Chronic atrial fibrillation: Secondary | ICD-10-CM | POA: Diagnosis not present

## 2016-08-05 DIAGNOSIS — H353110 Nonexudative age-related macular degeneration, right eye, stage unspecified: Secondary | ICD-10-CM | POA: Diagnosis not present

## 2016-08-30 ENCOUNTER — Encounter: Payer: Self-pay | Admitting: *Deleted

## 2016-09-02 ENCOUNTER — Encounter: Payer: Self-pay | Admitting: Cardiology

## 2016-09-02 ENCOUNTER — Ambulatory Visit (INDEPENDENT_AMBULATORY_CARE_PROVIDER_SITE_OTHER): Payer: Medicare Other | Admitting: Cardiology

## 2016-09-02 ENCOUNTER — Encounter: Payer: Self-pay | Admitting: *Deleted

## 2016-09-02 VITALS — BP 132/72 | HR 109 | Ht 60.0 in | Wt 116.4 lb

## 2016-09-02 DIAGNOSIS — I6523 Occlusion and stenosis of bilateral carotid arteries: Secondary | ICD-10-CM

## 2016-09-02 DIAGNOSIS — I10 Essential (primary) hypertension: Secondary | ICD-10-CM

## 2016-09-02 DIAGNOSIS — I4891 Unspecified atrial fibrillation: Secondary | ICD-10-CM

## 2016-09-02 DIAGNOSIS — E782 Mixed hyperlipidemia: Secondary | ICD-10-CM

## 2016-09-02 MED ORDER — METOPROLOL TARTRATE 25 MG PO TABS: 25.0000 mg | ORAL_TABLET | Freq: Two times a day (BID) | ORAL | 6 refills | Status: DC

## 2016-09-02 NOTE — Patient Instructions (Signed)
Medication Instructions:   Increase Lopressor to 25mg twice a day.  Continue all other medications.    Labwork: none  Testing/Procedures: none  Follow-Up: Your physician wants you to follow up in: 6 months.  You will receive a reminder letter in the mail one-two months in advance.  If you don't receive a letter, please call our office to schedule the follow up appointment   Any Other Special Instructions Will Be Listed Below (If Applicable).  If you need a refill on your cardiac medications before your next appointment, please call your pharmacy.  

## 2016-09-02 NOTE — Progress Notes (Addendum)
Clinical Summary Ms. Medford is a 83 y.o.female seen today for follow up of the following medical problems.   1. Afib -rare palpitations at times.  - no bleeding troubles on coumadin. Has not been interested in NOACs.    2. Chest pain  - reports several years history of intermittent chest pain - cath 2009 patent coronaries - recent repeat echo with normal LVEF, no wall motion abnormalities.  - no  symptoms since last visit  3. Carotid stenosis - 09/2014 carotid US with stable bilateral 1-39% stenosis.  - denies any recent neuro symptoms  4. Hyperlipidemia - 05/2015 TC 182 TG 93 HDL 102 LDL 61 -she remains compliant with statin    Past Medical History:  Diagnosis Date  . Abnormal EKG    low voltage (no amyloid)  . Anxiety    May play a role with some symptoms  . Aortic valve sclerosis    Echo  04/2009  EF 65%.no stenosis.... echo... September, 2010  . Arthritis   . Atrial fibrillation ([place])    DCCV prior to 04/05/2009 but reverted A.Fib, consultation w/Dr.Klein, decision to use Propafenone and   cardiovert again..cardioversion June 08, 2009.[name] on 4 days Propafenone... 100 Joules... normal sinus rhythm... Cardizem stopped.. /  return to atrial fib.[name]   /   cardionet on cardizem...09/14/2009...rates  52-190.  On Coumadin  . Back pain    February, 2013  . Carotid stenosis    Dopplers 2009 better than past; Dopplers 09/07/2009 <50% bilateral-stable  . Chest pain    cardiac catheterization.. 2009... no significant CAD.[name] / recurrent chest pain 2010.. consider; EF 65% by Echo 04/2009  . Drug therapy    coumadin  . Dyspnea   . Ejection fraction   . GERD (gastroesophageal reflux disease)    Aspirin Stopped  . H/O Spinal surgery    Successful,  2013  . Hyperlipidemia   . Hypertension   . Shortness of breath    Shortness of breath and fatigue June, 2012  . Visual changes    Prior planned to have ophthalmology evlaution     Allergies  Allergen Reactions  .  Tylenol [Acetaminophen]     blisters     Current Outpatient Prescriptions  Medication Sig Dispense Refill  . Calcium Carbonate-Vitamin D (CALCIUM 600+D) 600-400 MG-UNIT per tablet Take 1 tablet by mouth 2 (two) times daily.      [name] diltiazem (CARDIZEM CD) 180 MG 24 hr capsule Take 180 mg by mouth daily.     . furosemide (LASIX) 20 MG tablet Take 20 mg by mouth every morning.     . metoprolol tartrate (LOPRESSOR) 25 MG tablet Take 12.5 mg by mouth 2 (two) times daily.    . Multiple Vitamin (MULTIVITAMIN) tablet Take 1 tablet by mouth daily.      . simvastatin (ZOCOR) 20 MG tablet Take 20 mg by mouth at bedtime.     . traMADol (ULTRAM) 50 MG tablet Take 25 mg by mouth every 12 (twelve) hours as needed.    . warfarin (COUMADIN) 1 MG tablet Take 1 tablet by mouth as directed. MANAGED BY DR. Nadara Mustard  0   No current facility-administered medications for this visit.      Past Surgical History:  Procedure Laterality Date  . BACK SURGERY     X 2, most recent 2009  . CATARACT EXTRACTION, BILATERAL       Allergies  Allergen Reactions  . Tylenol [Acetaminophen]     blisters  Family History  Problem Relation Age of Onset  . Cancer Other   . Coronary artery disease Other      Social History Ms. Goldsborough reports that she has never smoked. She has never used smokeless tobacco. Ms. Olbrich reports that she does not drink alcohol.   Review of Systems CONSTITUTIONAL: No weight loss, fever, chills, weakness or fatigue.  HEENT: Eyes: No visual loss, blurred vision, double vision or yellow sclerae.No hearing loss, sneezing, congestion, runny nose or sore throat.  SKIN: No rash or itching.  CARDIOVASCULAR: per HPI RESPIRATORY: No shortness of breath, cough or sputum.  GASTROINTESTINAL: No anorexia, nausea, vomiting or diarrhea. No abdominal pain or blood.  GENITOURINARY: No burning on urination, no polyuria NEUROLOGICAL: No headache, dizziness, syncope, paralysis, ataxia, numbness  or tingling in the extremities. No change in bowel or bladder control.  MUSCULOSKELETAL: No muscle, back pain, joint pain or stiffness.  LYMPHATICS: No enlarged nodes. No history of splenectomy.  PSYCHIATRIC: No history of depression or anxiety.  ENDOCRINOLOGIC: No reports of sweating, cold or heat intolerance. No polyuria or polydipsia.  [name]   Physical Examination Vitals:   09/02/16 1109  BP: 132/72  Pulse: (!) 109   Vitals:   09/02/16 1109  Weight: 116 lb 6.4 oz (52.8 kg)  Height: 5' (1.524 m)    Gen: resting comfortably, no acute distress HEENT: no scleral icterus, pupils equal round and reactive, no palptable cervical adenopathy,  CV: irreg, 2/6 systolic murmur at apex, no jvd Resp: Clear to auscultation bilaterally GI: abdomen is soft, non-tender, non-distended, normal bowel sounds, no hepatosplenomegaly MSK: extremities are warm, no edema.  Skin: warm, no rash Neuro:  no focal deficits Psych: appropriate affect   Diagnostic Studies 12/2007 Cath HOSPITAL COURSE: Left heart cardiac catheterization was undertaken this  morning revealing normal coronary arteries with normal LV function  without regional wall motion abnormalities.     01/2014 Echo [facility] LVEF 55-60%, moderate MR,   03/2012 Carotid US No significant stenosis  09/2014 Carotid US 1-39% bilateral disease   01/2016 echo [facility]: LVEF 55-60%, mod LAE, mod RAE, mild to mod MR  Assessment and Plan  1. Afib  - heart rates elevated, EKG in clinic shows afib rate 104.  we will increase lopressor to 25mg  bid.  - CHADS2Vasc score is 4, continue anticoagulation.    2. Chest pain  - long history of chest pain Prior negative cath in 2009  - no recent symptoms, we will continue to follow clinically  3. Carotid stenosis - no symptoms. Mild by last few checks, at 83 years old with stable mild disease have elected not to repeat US at this time.   4. Hyperlipidemia - we will continue statin -  request labs from pcp  5. HTN - remains at goal, she will continue current meds   F/u 6 months    Arnoldo Lenis, M.D.

## 2016-09-09 DIAGNOSIS — I482 Chronic atrial fibrillation: Secondary | ICD-10-CM | POA: Diagnosis not present

## 2016-10-15 DIAGNOSIS — I482 Chronic atrial fibrillation: Secondary | ICD-10-CM | POA: Diagnosis not present

## 2016-11-07 ENCOUNTER — Ambulatory Visit (INDEPENDENT_AMBULATORY_CARE_PROVIDER_SITE_OTHER): Payer: Medicare Other | Admitting: Cardiology

## 2016-11-07 ENCOUNTER — Encounter: Payer: Self-pay | Admitting: Cardiology

## 2016-11-07 VITALS — BP 111/63 | HR 106 | Ht 60.0 in | Wt 121.2 lb

## 2016-11-07 DIAGNOSIS — E782 Mixed hyperlipidemia: Secondary | ICD-10-CM

## 2016-11-07 DIAGNOSIS — I4891 Unspecified atrial fibrillation: Secondary | ICD-10-CM

## 2016-11-07 DIAGNOSIS — I10 Essential (primary) hypertension: Secondary | ICD-10-CM

## 2016-11-07 DIAGNOSIS — R0789 Other chest pain: Secondary | ICD-10-CM

## 2016-11-07 DIAGNOSIS — I6523 Occlusion and stenosis of bilateral carotid arteries: Secondary | ICD-10-CM

## 2016-11-07 MED ORDER — METOPROLOL TARTRATE 25 MG PO TABS: 37.5000 mg | ORAL_TABLET | Freq: Two times a day (BID) | ORAL | 1 refills | Status: DC

## 2016-11-07 NOTE — Progress Notes (Signed)
Clinical Summary [name] [name] is a 83 y.o.female seen today for follow up of the following medical problems.   1. Afib -rare palpitations at times.  -  Has not been interested in NOACs. Has done well on coumadin  - last visit we increased lopressor to 25mg  bid due to palpitaitons. Still with symptoms at times.   2. Chest pain  - reports several years history of intermittent chest pain - cath 2009 patent coronaries - recent repeat echo with normal LVEF, no wall motion abnormalities.  - no  symptoms since last visit  3. Carotid stenosis - 09/2014 carotid US with stable bilateral 1-39% stenosis.  - no recent neuro symptoms since last visit  4. Hyperlipidemia -she remains compliant with statin    Past Medical History:  Diagnosis Date  . Abnormal EKG    low voltage (no amyloid)  . Anxiety    May play a role with some symptoms  . Aortic valve sclerosis    Echo  04/2009  EF 65%.no stenosis.... echo... September, 2010  . Arthritis   . Atrial fibrillation ([place])    DCCV prior to 04/05/2009 but reverted A.Fib, consultation w/Dr.Klein, decision to use Propafenone and   cardiovert again..cardioversion June 08, 2009.[name] on 4 days Propafenone... 100 Joules... normal sinus rhythm... Cardizem stopped.. /  return to atrial fib.[name]   /   cardionet on cardizem...09/14/2009...rates  52-190.  On Coumadin  . Back pain    February, 2013  . Carotid stenosis    Dopplers 2009 better than past; Dopplers 09/07/2009 <50% bilateral-stable  . Chest pain    cardiac catheterization.. 2009... no significant CAD.[name] / recurrent chest pain 2010.. consider; EF 65% by Echo 04/2009  . Drug therapy    coumadin  . Dyspnea   . Ejection fraction   . GERD (gastroesophageal reflux disease)    Aspirin Stopped  . H/O Spinal surgery    Successful,  2013  . Hyperlipidemia   . Hypertension   . Shortness of breath    Shortness of breath and fatigue June, 2012  . Visual changes    Prior planned to have  ophthalmology evlaution     Allergies  Allergen Reactions  . Tylenol [Acetaminophen]     blisters     Current Outpatient Prescriptions  Medication Sig Dispense Refill  . Calcium Carbonate-Vitamin D (CALCIUM 600+D) 600-400 MG-UNIT per tablet Take 1 tablet by mouth 2 (two) times daily.      [name] diltiazem (CARDIZEM CD) 180 MG 24 hr capsule Take 180 mg by mouth daily.     . furosemide (LASIX) 20 MG tablet Take 20 mg by mouth every morning.     . metoprolol tartrate (LOPRESSOR) 25 MG tablet Take 1 tablet (25 mg total) by mouth 2 (two) times daily. 60 tablet 6  . Multiple Vitamin (MULTIVITAMIN) tablet Take 1 tablet by mouth daily.      . simvastatin (ZOCOR) 20 MG tablet Take 20 mg by mouth at bedtime.     . traMADol (ULTRAM) 50 MG tablet Take 25 mg by mouth every 12 (twelve) hours as needed.    . warfarin (COUMADIN) 1 MG tablet Take 1 tablet by mouth as directed. MANAGED BY DR. Nadara Mustard  0   No current facility-administered medications for this visit.      Past Surgical History:  Procedure Laterality Date  . BACK SURGERY     X 2, most recent 2009  . CATARACT EXTRACTION, BILATERAL       Allergies  Allergen Reactions  . Tylenol [Acetaminophen]     blisters      Family History  Problem Relation Age of Onset  . Cancer Other   . Coronary artery disease Other      Social History Ms. Spacek reports that she has never smoked. She has never used smokeless tobacco. Ms. Kealey reports that she does not drink alcohol.   Review of Systems CONSTITUTIONAL: No weight loss, fever, chills, weakness or fatigue.  HEENT: Eyes: No visual loss, blurred vision, double vision or yellow sclerae.No hearing loss, sneezing, congestion, runny nose or sore throat.  SKIN: No rash or itching.  CARDIOVASCULAR: per hpi RESPIRATORY: No shortness of breath, cough or sputum.  GASTROINTESTINAL: No anorexia, nausea, vomiting or diarrhea. No abdominal pain or blood.  GENITOURINARY: No burning on  urination, no polyuria NEUROLOGICAL: No headache, dizziness, syncope, paralysis, ataxia, numbness or tingling in the extremities. No change in bowel or bladder control.  MUSCULOSKELETAL: No muscle, back pain, joint pain or stiffness.  LYMPHATICS: No enlarged nodes. No history of splenectomy.  PSYCHIATRIC: No history of depression or anxiety.  ENDOCRINOLOGIC: No reports of sweating, cold or heat intolerance. No polyuria or polydipsia.  [name]   Physical Examination Vitals:   11/07/16 0950  BP: 111/63  Pulse: (!) 106   Vitals:   11/07/16 0950  Weight: 121 lb 3.2 oz (55 kg)  Height: 5' (1.524 m)    Gen: resting comfortably, no acute distress HEENT: no scleral icterus, pupils equal round and reactive, no palptable cervical adenopathy,  CV: RRR, no m/r/g no jvd Resp: Clear to auscultation bilaterally GI: abdomen is soft, non-tender, non-distended, normal bowel sounds, no hepatosplenomegaly MSK: extremities are warm, no edema.  Skin: warm, no rash Neuro:  no focal deficits Psych: appropriate affect   Diagnostic Studies 12/2007 Cath HOSPITAL COURSE: Left heart cardiac catheterization was undertaken this  morning revealing normal coronary arteries with normal LV function  without regional wall motion abnormalities.     01/2014 Echo [facility] LVEF 55-60%, moderate MR,   03/2012 Carotid US No significant stenosis  09/2014 Carotid US 1-39% bilateral disease   01/2016 echo [facility]: LVEF 55-60%, mod LAE, mod RAE, mild to mod MR    Assessment and Plan   1. Afib  - still with palpitations at times. We will increase lopressor to 37.5mg  bid - CHADS2Vasc score is 4, continue anticoagulation.  - ekg in clinic shows afib rate 87   2. Chest pain  - long history of chest pain.  negative cath in 2009  - no recent symptoms, continue to follow clinically   3. Carotid stenosis - no symptoms. Mild by [place]. We have elected not to repeat US at this time.   4.  Hyperlipidemia - continue statin - we will request labs from pcp  5. HTN -she  remains at goal,  continue current meds   F/u 2 months     Arnoldo Lenis, M.D., F.A.C.C.

## 2016-11-07 NOTE — Patient Instructions (Signed)
Your physician recommends that you schedule a follow-up appointment in: 2 MONTHS WITH DR. BRANCH  Your physician has recommended you make the following change in your medication:   INCREASE LOPRESSOR 37.5 MG TWICE DAILY  PLEASE UPDATE [place] ON Monday WITH YOUR SYMPTOMS   Thank you for choosing [facility]!!

## 2016-11-11 ENCOUNTER — Telehealth: Payer: Self-pay | Admitting: Cardiology

## 2016-11-11 DIAGNOSIS — R002 Palpitations: Secondary | ICD-10-CM

## 2016-11-11 MED ORDER — METOPROLOL TARTRATE 25 MG PO TABS: 50.0000 mg | ORAL_TABLET | Freq: Two times a day (BID) | ORAL | Status: DC

## 2016-11-11 NOTE — Telephone Encounter (Signed)
Call placed to patient, does feel about the same.  No worse.  Main complaint is SOB & weakness.  SOB bothers her if she does the least little thing.

## 2016-11-11 NOTE — Telephone Encounter (Signed)
Please increase lopressor to 50mg  bid, order 1 week event monitor   Zandra Abts MD

## 2016-11-11 NOTE — Telephone Encounter (Signed)
Mrs. Bookbinder was seen on Thursday and was told to contact office on how she is feeling today.  States that she feels about the same as she did Thursday.

## 2016-11-11 NOTE — Telephone Encounter (Signed)
Patient notified & agrees to do monitor.  Order placed with [employer].

## 2016-11-15 ENCOUNTER — Telehealth: Payer: Self-pay | Admitting: Cardiology

## 2016-11-15 DIAGNOSIS — I4891 Unspecified atrial fibrillation: Secondary | ICD-10-CM

## 2016-11-15 NOTE — Telephone Encounter (Signed)
LM to return call.

## 2016-11-15 NOTE — Telephone Encounter (Signed)
Pt agreeable for 24 hour holter - will come Wednesday at 10am to have placed.

## 2016-11-15 NOTE — Telephone Encounter (Signed)
Patient states there is no way she can figure out this monitor to be able to put it on

## 2016-11-15 NOTE — Telephone Encounter (Signed)
Patient returned your call.

## 2016-11-15 NOTE — Addendum Note (Signed)
Addended by: Julian Hy T on: 11/15/2016 03:43 PM   Modules accepted: Orders

## 2016-11-15 NOTE — Telephone Encounter (Signed)
Spoke with pt and offered her to come in so that we could put monitor on for her and better explain how it works - pt declined our help and says she is going to send monitor back - will forward to Dr Francine Graven

## 2016-11-15 NOTE — Telephone Encounter (Signed)
Please d/c monitor, arrange 24 hr holter monitor and explain to her this we will put on for her   Zandra Abts MD

## 2016-11-20 ENCOUNTER — Ambulatory Visit (INDEPENDENT_AMBULATORY_CARE_PROVIDER_SITE_OTHER): Payer: Medicare Other

## 2016-11-20 DIAGNOSIS — I4891 Unspecified atrial fibrillation: Secondary | ICD-10-CM | POA: Diagnosis not present

## 2016-11-20 DIAGNOSIS — R002 Palpitations: Secondary | ICD-10-CM | POA: Diagnosis not present

## 2016-11-21 ENCOUNTER — Telehealth: Payer: Self-pay | Admitting: Cardiology

## 2016-11-21 NOTE — Telephone Encounter (Signed)
[name] [name] has questions about her new medication that was recently given to her. Please call cell phone.

## 2016-11-22 NOTE — Telephone Encounter (Signed)
Patient advised that she should be taking Metoprolol tart 25mg  - 2 tabs BID for now.  She verbalized understanding.

## 2016-11-22 NOTE — Telephone Encounter (Signed)
Mrs. Orbach called the office. States that she is going to have to run to the store. Please wait until 330pm to call her back.

## 2016-11-22 NOTE — Telephone Encounter (Signed)
Left message to return call 

## 2016-11-26 DIAGNOSIS — I482 Chronic atrial fibrillation: Secondary | ICD-10-CM | POA: Diagnosis not present

## 2016-12-04 ENCOUNTER — Telehealth: Payer: Self-pay | Admitting: *Deleted

## 2016-12-04 DIAGNOSIS — R0602 Shortness of breath: Secondary | ICD-10-CM

## 2016-12-04 NOTE — Telephone Encounter (Signed)
Pt still c/o SOB - will place orders for echo and route to be scheduled - routed results to pcp

## 2016-12-04 NOTE — Telephone Encounter (Signed)
-----   Message from Arnoldo Lenis, MD sent at 11/29/2016 10:08 AM EDT ----- Heart monitor overall looks good. If she is still having SOB please arrange an echo   Zandra Abts MD

## 2016-12-12 ENCOUNTER — Telehealth: Payer: Self-pay | Admitting: Cardiology

## 2016-12-12 NOTE — Telephone Encounter (Signed)
Pre-cert Verification for the following procedure   Echo scheduled for 12/25/16 at [place]

## 2016-12-18 NOTE — Telephone Encounter (Signed)
ERROR

## 2016-12-25 ENCOUNTER — Other Ambulatory Visit: Payer: Self-pay | Admitting: Cardiology

## 2016-12-25 ENCOUNTER — Other Ambulatory Visit: Payer: Self-pay

## 2016-12-25 ENCOUNTER — Ambulatory Visit (INDEPENDENT_AMBULATORY_CARE_PROVIDER_SITE_OTHER): Payer: Medicare Other

## 2016-12-25 DIAGNOSIS — R0602 Shortness of breath: Secondary | ICD-10-CM

## 2016-12-25 LAB — ECHOCARDIOGRAM COMPLETE
E decel time: 214 msec
E/e' ratio: 11.23
FS: 32 % (ref 28–44)
IVS/LV PW RATIO, ED: 1.18
LA ID, A-P, ES: 41 mm
LA diam end sys: 41 mm
LA diam index: 2.72 cm/m2
LA vol A4C: 79.7 ml
LA vol index: 56.8 mL/m2
LA vol: 85.8 mL
LV E/e' medial: 11.23
LV E/e'average: 11.23
LV PW d: 6.95 mm — AB (ref 0.6–1.1)
LV dias vol index: 32 mL/m2
LV dias vol: 48 mL (ref 46–106)
LV e' LATERAL: 10.6 cm/s
LV sys vol index: 13 mL/m2
LV sys vol: 19 mL (ref 14–42)
LVOT SV: 46 mL
LVOT VTI: 18.1 cm
LVOT area: 2.54 cm2
LVOT diameter: 18 mm
LVOT peak vel: 89.2 cm/s
Lateral S' vel: 6.5 cm/s
MV Dec: 214
MV Peak grad: 6 mmHg
MV pk A vel: 44.6 m/s
MV pk E vel: 119 m/s
PISA EROA: 0.04 cm2
PV Reg grad dias: 6 mmHg
PV Reg vel dias: 119 cm/s
RV sys press: 27 mmHg
Reg peak vel: 244 cm/s
Simpson's disk: 60
Stroke v: 29 ml
TAPSE: 11.6 mm
TDI e' lateral: 10.6
TDI e' medial: 6.96
TR max vel: 244 cm/s
VTI: 185 cm

## 2016-12-25 MED ORDER — METOPROLOL TARTRATE 25 MG PO TABS: 50.0000 mg | ORAL_TABLET | Freq: Two times a day (BID) | ORAL | 6 refills | Status: DC

## 2016-12-25 NOTE — Telephone Encounter (Signed)
Refill:   metoprolol tartrate (LOPRESSOR) 25 MG  [facility]- [place], [place]

## 2016-12-26 ENCOUNTER — Telehealth: Payer: Self-pay | Admitting: Cardiology

## 2016-12-26 NOTE — Telephone Encounter (Signed)
Pt requesting echo results - explained that test was completed yesterday and would route to Dr Harl Bowie - Lopressor refills already sent

## 2016-12-26 NOTE — Telephone Encounter (Signed)
Wanting results from test and asking if refills have been completed

## 2016-12-27 ENCOUNTER — Telehealth: Payer: Self-pay | Admitting: *Deleted

## 2016-12-27 MED ORDER — METOPROLOL TARTRATE 25 MG PO TABS: 50.0000 mg | ORAL_TABLET | Freq: Two times a day (BID) | ORAL | 6 refills | Status: DC

## 2016-12-27 MED ORDER — METOPROLOL TARTRATE 50 MG PO TABS: 50.0000 mg | ORAL_TABLET | Freq: Two times a day (BID) | ORAL | 3 refills | Status: DC

## 2016-12-27 NOTE — Telephone Encounter (Signed)
Should be lopressor 50mg  bid   Zandra Abts MD

## 2016-12-27 NOTE — Telephone Encounter (Signed)
Patient is requesting 50 mg tablets instead of 25 mg. 50 mg lopressor sent to pharmacy.

## 2016-12-27 NOTE — Telephone Encounter (Signed)
Echo shows normal heart pumping function. The heart muscle has some evidence of being stiff which is common with age and can cause some SOB. She has a heart valve that is moderately leaky. Overall no major abnormalities on her echo. We will discuss further at our upcoming f/u   Zandra Abts MD    Pt daughter Patsy(DPR) voiced understanding - confirmed f/u appt - routed to pcp

## 2016-12-27 NOTE — Telephone Encounter (Signed)
Per daughter, patient said she was advised by Dr. Harl Bowie to take metoprolol is 25 mg twice daily. Nurse advised daughter that metoprolol was increased on 11/11/16 to 50 mg twice daily. Daughter informed that she may not receive a return call today but once this is addressed with Dr. Harl Bowie, we would call her back. Daughter verbalized understanding. Please confirm dose for metoprolol.

## 2017-01-08 ENCOUNTER — Other Ambulatory Visit: Payer: Self-pay | Admitting: *Deleted

## 2017-01-08 ENCOUNTER — Encounter: Payer: Self-pay | Admitting: *Deleted

## 2017-01-08 ENCOUNTER — Encounter: Payer: Self-pay | Admitting: Cardiology

## 2017-01-08 ENCOUNTER — Ambulatory Visit (INDEPENDENT_AMBULATORY_CARE_PROVIDER_SITE_OTHER): Payer: Medicare Other | Admitting: Cardiology

## 2017-01-08 VITALS — BP 114/67 | HR 60 | Ht 60.0 in | Wt 118.0 lb

## 2017-01-08 DIAGNOSIS — E782 Mixed hyperlipidemia: Secondary | ICD-10-CM | POA: Diagnosis not present

## 2017-01-08 DIAGNOSIS — I6523 Occlusion and stenosis of bilateral carotid arteries: Secondary | ICD-10-CM | POA: Diagnosis not present

## 2017-01-08 DIAGNOSIS — R0602 Shortness of breath: Secondary | ICD-10-CM | POA: Diagnosis not present

## 2017-01-08 DIAGNOSIS — I4891 Unspecified atrial fibrillation: Secondary | ICD-10-CM | POA: Diagnosis not present

## 2017-01-08 DIAGNOSIS — R0789 Other chest pain: Secondary | ICD-10-CM

## 2017-01-08 DIAGNOSIS — I482 Chronic atrial fibrillation: Secondary | ICD-10-CM | POA: Diagnosis not present

## 2017-01-08 MED ORDER — FUROSEMIDE 40 MG PO TABS
40.0000 mg | ORAL_TABLET | Freq: Every day | ORAL | 1 refills | Status: DC
Start: 2017-01-08 — End: 2017-07-08

## 2017-01-08 NOTE — Progress Notes (Signed)
Clinical Summary [name] [name] is a 83 y.o.female seen today for follow up of the following medical problems.   1. Afib - last visit we increased lopressor to 25mg  bid due to palpitaitons. Still with symptoms at times, we then increased to 50mg  bid - 10/2016 heart monitor rate controlled afib.  - still with palpitations at times   2. Chest pain  - reports several years history of intermittent chest pain - cath 2009 patent coronaries - recent repeat echo with normal LVEF, no wall motion abnormalities.  - no symptoms since last visit  3. Carotid stenosis - 09/2014 carotid US with stable bilateral 1-39% stenosis.  - no neuro symptoms   4. Hyperlipidemia - compliant with statin  5. SOB - echo 12/2016 LVEF 60-65%, no WMAs, cannot eval diastolic function, moderate MR, +PFO. - DOE just walking in yard.  - can't walk upstairs - occasional LE edema.  Past Medical History:  Diagnosis Date  . Abnormal EKG    low voltage (no amyloid)  . Anxiety    May play a role with some symptoms  . Aortic valve sclerosis    Echo  04/2009  EF 65%.no stenosis.... echo... September, 2010  . Arthritis   . Atrial fibrillation ([place])    DCCV prior to 04/05/2009 but reverted A.Fib, consultation w/Dr.Klein, decision to use Propafenone and   cardiovert again..cardioversion June 08, 2009.[name] on 4 days Propafenone... 100 Joules... normal sinus rhythm... Cardizem stopped.. /  return to atrial fib.[name]   /   cardionet on cardizem...09/14/2009...rates  52-190.  On Coumadin  . Back pain    February, 2013  . Carotid stenosis    Dopplers 2009 better than past; Dopplers 09/07/2009 <50% bilateral-stable  . Chest pain    cardiac catheterization.. 2009... no significant CAD.[name] / recurrent chest pain 2010.. consider; EF 65% by Echo 04/2009  . Drug therapy    coumadin  . Dyspnea   . Ejection fraction   . GERD (gastroesophageal reflux disease)    Aspirin Stopped  . H/O Spinal surgery    Successful,  2013  .  Hyperlipidemia   . Hypertension   . Shortness of breath    Shortness of breath and fatigue June, 2012  . Visual changes    Prior planned to have ophthalmology evlaution     Allergies  Allergen Reactions  . Tylenol [Acetaminophen]     blisters     Current Outpatient Prescriptions  Medication Sig Dispense Refill  . Calcium Carbonate-Vitamin D (CALCIUM 600+D) 600-400 MG-UNIT per tablet Take 1 tablet by mouth 2 (two) times daily.      [name] diltiazem (CARDIZEM) 60 MG tablet Take 60 mg by mouth 3 (three) times daily.    . furosemide (LASIX) 20 MG tablet Take 20 mg by mouth every morning.     . metoprolol (LOPRESSOR) 50 MG tablet Take 1 tablet (50 mg total) by mouth 2 (two) times daily. 60 tablet 3  . Multiple Vitamin (MULTIVITAMIN) tablet Take 1 tablet by mouth daily.      . simvastatin (ZOCOR) 20 MG tablet Take 20 mg by mouth at bedtime.     . traMADol (ULTRAM) 50 MG tablet Take 25 mg by mouth every 12 (twelve) hours as needed.    . warfarin (COUMADIN) 1 MG tablet Take 1 tablet by mouth as directed. MANAGED BY DR. Nadara Mustard  0   No current facility-administered medications for this visit.      Past Surgical History:  Procedure Laterality Date  .  BACK SURGERY     X 2, most recent 2009  . CATARACT EXTRACTION, BILATERAL       Allergies  Allergen Reactions  . Tylenol [Acetaminophen]     blisters      Family History  Problem Relation Age of Onset  . Cancer Other   . Coronary artery disease Other      Social History Ms. Watchman reports that she has never smoked. She has never used smokeless tobacco. Ms. Cheaney reports that she does not drink alcohol.   Review of Systems CONSTITUTIONAL: No weight loss, fever, chills, weakness or fatigue.  HEENT: Eyes: No visual loss, blurred vision, double vision or yellow sclerae.No hearing loss, sneezing, congestion, runny nose or sore throat.  SKIN: No rash or itching.  CARDIOVASCULAR: per hpi RESPIRATORY: per hpi GASTROINTESTINAL:  No anorexia, nausea, vomiting or diarrhea. No abdominal pain or blood.  GENITOURINARY: No burning on urination, no polyuria NEUROLOGICAL: No headache, dizziness, syncope, paralysis, ataxia, numbness or tingling in the extremities. No change in bowel or bladder control.  MUSCULOSKELETAL: No muscle, back pain, joint pain or stiffness.  LYMPHATICS: No enlarged nodes. No history of splenectomy.  PSYCHIATRIC: No history of depression or anxiety.  ENDOCRINOLOGIC: No reports of sweating, cold or heat intolerance. No polyuria or polydipsia.  [name]   Physical Examination Vitals:   01/08/17 1031  BP: 114/67  Pulse: 60   Vitals:   01/08/17 1031  Weight: 118 lb (53.5 kg)  Height: 5' (1.524 m)    Gen: resting comfortably, no acute distress HEENT: no scleral icterus, pupils equal round and reactive, no palptable cervical adenopathy,  CV: RRR, no m/r/g, no jvd Resp: Clear to auscultation bilaterally GI: abdomen is soft, non-tender, non-distended, normal bowel sounds, no hepatosplenomegaly MSK: extremities are warm, no edema.  Skin: warm, no rash Neuro:  no focal deficits Psych: appropriate affect   Diagnostic Studies  12/2007 Cath HOSPITAL COURSE: Left heart cardiac catheterization was undertaken this  morning revealing normal coronary arteries with normal LV function  without regional wall motion abnormalities.     01/2014 [facility] LVEF 55-60%, moderate MR,   03/2012 Carotid US No significant stenosis  09/2014 Carotid US 1-39% bilateral disease   01/2016 echo [facility]: LVEF 55-60%, mod LAE, mod RAE, mild to mod MR   Assessment and Plan  1. Afib  - CHADS2Vasc score is 4, continue anticoagulation.  - continue current meds   2. Chest pain  - long history of chest pain.  negative cath in 2009  - no recent symptoms - we will continue tom monitor   3. Carotid stenosis -  Mild by [place]. We have elected not to repeat US at this time.   4. Hyperlipidemia - she  will continue statin   5. HTN -her bp  remains at goal,  continue current meds  6. SOB - we will check CXR, PFTs - increase lasix to 40mg  daily, check BMET/Mg in 1 week  F/u 1 month    Arnoldo Lenis, M.D.

## 2017-01-08 NOTE — Patient Instructions (Signed)
Your physician recommends that you schedule a follow-up appointment in: [place] has recommended you make the following change in your medication:   INCREASE LASIX 40 MG DAILY  Your physician recommends that you return for lab work in: 1 WEEK BMP/CBC/MG/TSH  A chest x-ray takes a picture of the organs and structures inside the chest, including the heart, lungs, and blood vessels. This test can show several things, including, whether the heart is enlarges; whether fluid is building up in the lungs; and whether pacemaker / defibrillator leads are still in place.  Your physician has recommended that you have a pulmonary function test. Pulmonary Function Tests are a group of tests that measure how well air moves in and out of your lungs.  Thank you for choosing [facility]!!

## 2017-01-17 ENCOUNTER — Ambulatory Visit (HOSPITAL_COMMUNITY)
Admission: RE | Admit: 2017-01-17 | Discharge: 2017-01-17 | Disposition: A | Discharge status: Home / Self Care | Payer: Medicare Other | Source: Ambulatory Visit | Attending: Cardiology | Admitting: Cardiology

## 2017-01-17 DIAGNOSIS — J449 Chronic obstructive pulmonary disease, unspecified: Secondary | ICD-10-CM | POA: Insufficient documentation

## 2017-01-17 DIAGNOSIS — R0602 Shortness of breath: Secondary | ICD-10-CM | POA: Diagnosis not present

## 2017-01-17 LAB — PULMONARY FUNCTION TEST
FEF 25-75 Post: 2.36 L/sec
FEF 25-75 Pre: 1.68 L/sec
FEF2575-%Change-Post: 40 %
FEF2575-%Pred-Post: 362 %
FEF2575-%Pred-Pre: 258 %
FEV1-%Change-Post: 4 %
FEV1-%Pred-Post: 131 %
FEV1-%Pred-Pre: 125 %
FEV1-Post: 1.58 L
FEV1-Pre: 1.51 L
FEV1FVC-%Change-Post: -1 %
FEV1FVC-%Pred-Pre: 115 %
FEV6-%Change-Post: 6 %
FEV6-%Pred-Post: 125 %
FEV6-%Pred-Pre: 118 %
FEV6-Post: 1.92 L
FEV6-Pre: 1.81 L
FEV6FVC-%Pred-Post: 108 %
FEV6FVC-%Pred-Pre: 108 %
FVC-%Change-Post: 5 %
FVC-%Pred-Post: 115 %
FVC-%Pred-Pre: 108 %
FVC-Post: 1.92 L
FVC-Pre: 1.82 L
Post FEV1/FVC ratio: 82 %
Post FEV6/FVC ratio: 100 %
Pre FEV1/FVC ratio: 83 %
Pre FEV6/FVC Ratio: 100 %
RV % pred: 93 %
RV: 2.23 L
TLC % pred: 86 %
TLC: 3.87 L

## 2017-01-17 MED ORDER — ALBUTEROL SULFATE (2.5 MG/3ML) 0.083% IN NEBU
2.5000 mg | INHALATION_SOLUTION | Freq: Once | RESPIRATORY_TRACT | Status: AC
  Administered 2017-01-17: 2.5 mg via RESPIRATORY_TRACT

## 2017-01-23 ENCOUNTER — Telehealth: Payer: Self-pay | Admitting: *Deleted

## 2017-01-23 NOTE — Telephone Encounter (Signed)
Pt aware - routed to pcp  

## 2017-01-23 NOTE — Telephone Encounter (Signed)
-----   Message from Arnoldo Lenis, MD sent at 01/23/2017  3:31 PM EDT ----- Breathing tests and CXR look good. We will discuss further at our f/u  Zandra Abts MD

## 2017-02-10 ENCOUNTER — Ambulatory Visit (INDEPENDENT_AMBULATORY_CARE_PROVIDER_SITE_OTHER): Payer: Medicare Other | Admitting: Cardiology

## 2017-02-10 ENCOUNTER — Encounter: Payer: Self-pay | Admitting: Cardiology

## 2017-02-10 VITALS — BP 132/67 | HR 67 | Ht 60.0 in | Wt 119.0 lb

## 2017-02-10 DIAGNOSIS — I6523 Occlusion and stenosis of bilateral carotid arteries: Secondary | ICD-10-CM

## 2017-02-10 DIAGNOSIS — R0602 Shortness of breath: Secondary | ICD-10-CM | POA: Diagnosis not present

## 2017-02-10 DIAGNOSIS — I482 Chronic atrial fibrillation: Secondary | ICD-10-CM | POA: Diagnosis not present

## 2017-02-10 NOTE — Progress Notes (Signed)
Clinical Summary Ms. Berrian is a 83 y.o.female seen today for follow up of the following medical problems. This is a focused visit on her recent symptoms of SOB, for more detailed history please refer to prior clinic notes.   1. SOB - echo 12/2016 LVEF 60-65%, no WMAs, cannot eval diastolic function but severe LAE, moderate MR, +PFO. - breathing somewhat improved.  - since last visit completed CXR and PFTs that were overall benign     Past Medical History:  Diagnosis Date  . Abnormal EKG    low voltage (no amyloid)  . Anxiety    May play a role with some symptoms  . Aortic valve sclerosis    Echo  04/2009  EF 65%.no stenosis.... echo... September, 2010  . Arthritis   . Atrial fibrillation ([place])    DCCV prior to 04/05/2009 but reverted A.Fib, consultation w/Dr.Klein, decision to use Propafenone and   cardiovert again..cardioversion June 08, 2009.[name] on 4 days Propafenone... 100 Joules... normal sinus rhythm... Cardizem stopped.. /  return to atrial fib.[name]   /   cardionet on cardizem...09/14/2009...rates  52-190.  On Coumadin  . Back pain    February, 2013  . Carotid stenosis    Dopplers 2009 better than past; Dopplers 09/07/2009 <50% bilateral-stable  . Chest pain    cardiac catheterization.. 2009... no significant CAD.[name] / recurrent chest pain 2010.. consider; EF 65% by Echo 04/2009  . Drug therapy    coumadin  . Dyspnea   . Ejection fraction   . GERD (gastroesophageal reflux disease)    Aspirin Stopped  . H/O Spinal surgery    Successful,  2013  . Hyperlipidemia   . Hypertension   . Shortness of breath    Shortness of breath and fatigue June, 2012  . Visual changes    Prior planned to have ophthalmology evlaution     Allergies  Allergen Reactions  . Tylenol [Acetaminophen]     blisters     Current Outpatient Prescriptions  Medication Sig Dispense Refill  . Calcium Carbonate-Vitamin D (CALCIUM 600+D) 600-400 MG-UNIT per tablet Take 1 tablet by mouth 2 (two)  times daily.      [name] diltiazem (CARDIZEM) 60 MG tablet Take 60 mg by mouth 3 (three) times daily.    . furosemide (LASIX) 40 MG tablet Take 1 tablet (40 mg total) by mouth daily. 90 tablet 1  . metoprolol (LOPRESSOR) 50 MG tablet Take 1 tablet (50 mg total) by mouth 2 (two) times daily. 60 tablet 3  . Multiple Vitamin (MULTIVITAMIN) tablet Take 1 tablet by mouth daily.      . simvastatin (ZOCOR) 20 MG tablet Take 20 mg by mouth at bedtime.     . traMADol (ULTRAM) 50 MG tablet Take 25 mg by mouth every 12 (twelve) hours as needed.    . warfarin (COUMADIN) 1 MG tablet Take 1 tablet by mouth as directed. MANAGED BY DR. Nadara Mustard  0   No current facility-administered medications for this visit.      Past Surgical History:  Procedure Laterality Date  . BACK SURGERY     X 2, most recent 2009  . CATARACT EXTRACTION, BILATERAL       Allergies  Allergen Reactions  . Tylenol [Acetaminophen]     blisters      Family History  Problem Relation Age of Onset  . Cancer Other   . Coronary artery disease Other      Social History Ms. Duffner reports that she has  never smoked. She has never used smokeless tobacco. Ms. Sze reports that she does not drink alcohol.   Review of Systems CONSTITUTIONAL: No weight loss, fever, chills, weakness or fatigue.  HEENT: Eyes: No visual loss, blurred vision, double vision or yellow sclerae.No hearing loss, sneezing, congestion, runny nose or sore throat.  SKIN: No rash or itching.  CARDIOVASCULAR: no chest pain, no palpitations.  RESPIRATORY: per hpi GASTROINTESTINAL: No anorexia, nausea, vomiting or diarrhea. No abdominal pain or blood.  GENITOURINARY: No burning on urination, no polyuria NEUROLOGICAL: No headache, dizziness, syncope, paralysis, ataxia, numbness or tingling in the extremities. No change in bowel or bladder control.  MUSCULOSKELETAL: No muscle, back pain, joint pain or stiffness.  LYMPHATICS: No enlarged nodes. No history of  splenectomy.  PSYCHIATRIC: No history of depression or anxiety.  ENDOCRINOLOGIC: No reports of sweating, cold or heat intolerance. No polyuria or polydipsia.  [name]   Physical Examination Vitals:   02/10/17 1437  BP: 132/67  Pulse: 67   Vitals:   02/10/17 1437  Weight: 119 lb (54 kg)  Height: 5' (1.524 m)    Gen: resting comfortably, no acute distress HEENT: no scleral icterus, pupils equal round and reactive, no palptable cervical adenopathy,  CV: irreg, no m/r/g, no jvd Resp: Clear to auscultation bilaterally GI: abdomen is soft, non-tender, non-distended, normal bowel sounds, no hepatosplenomegaly MSK: extremities are warm, no edema.  Skin: warm, no rash Neuro:  no focal deficits Psych: appropriate affect   Diagnostic Studies /2009 Cath HOSPITAL COURSE: Left heart cardiac catheterization was undertaken this  morning revealing normal coronary arteries with normal LV function  without regional wall motion abnormalities.     01/2014 [facility] LVEF 55-60%, moderate MR,   03/2012 Carotid US No significant stenosis  09/2014 Carotid US 1-39% bilateral disease   01/2016 [facility]: LVEF 55-60%, mod LAE, mod RAE, mild to mod MR  12/2016 CXR IMPRESSION: No acute abnormality noted. Lungs are hyperinflated suggesting COPD   12/2016 PFTs: normal  12/2016 echo Study Conclusions  - Left ventricle: The cavity size was normal. Wall thickness was   normal. Systolic function was normal. The estimated ejection   fraction was in the range of 60% to 65%. Wall motion was normal;   there were no regional wall motion abnormalities. The study is   not technically sufficient to allow evaluation of LV diastolic   function. - Aortic valve: Mildly calcified annulus. Trileaflet; mildly   calcified leaflets. - Mitral valve: Mildly to moderately calcified annulus. Mildly   calcified leaflets . There was moderate regurgitation. - Left atrium: The atrium was severely  dilated. - Right atrium: The atrium was moderately dilated. - Atrial septum: There was a secundum ASD versus patent foramen   ovale present with intermittent small left to right shunt noted   by color Doppler. - Tricuspid valve: There was mild regurgitation. - Pulmonary arteries: PA peak pressure: 27 mm Hg (S). - Pericardium, extracardiac: There was no pericardial effusion.  Impressions:  - Normal LV wall thickness with LVEF 60-65%. Indeterminate   diastolic function in the setting of atrial fibrillation. Severe   left atrial enlargement and moderate right atrial enlargement.   Calcified mitral annulus with moderate mitral regurgitation.   Sclerotic aortic valve without stenosis. Mild tricuspid   regurgitation with PASP 27 mmHg. Secundum ASD versus PFO with   small degree of intermittent left to right shunt by color   Doppler. Assessment and Plan   1. SOB - unclear etiology. Echo with normal  LVEF, evidence of some diastolic dysfunction, mod MR. Pulmonary workup unremarkable. Follow symptoms at this time as they are somewhat improved with increased activity. Would not pursue ischemic testing at this time in absence of chest pain with some improvement in her symptoms.    F/u 4 months.       Arnoldo Lenis, M.D.

## 2017-02-10 NOTE — Patient Instructions (Addendum)

## 2017-02-27 DIAGNOSIS — I482 Chronic atrial fibrillation: Secondary | ICD-10-CM | POA: Diagnosis not present

## 2017-02-27 DIAGNOSIS — Z6823 Body mass index (BMI) 23.0-23.9, adult: Secondary | ICD-10-CM | POA: Diagnosis not present

## 2017-02-27 DIAGNOSIS — R42 Dizziness and giddiness: Secondary | ICD-10-CM | POA: Diagnosis not present

## 2017-03-17 ENCOUNTER — Other Ambulatory Visit: Payer: Self-pay

## 2017-03-24 DIAGNOSIS — I482 Chronic atrial fibrillation: Secondary | ICD-10-CM | POA: Diagnosis not present

## 2017-04-14 ENCOUNTER — Other Ambulatory Visit: Payer: Self-pay | Admitting: Cardiology

## 2017-05-06 DIAGNOSIS — I482 Chronic atrial fibrillation: Secondary | ICD-10-CM | POA: Diagnosis not present

## 2017-05-21 DIAGNOSIS — I482 Chronic atrial fibrillation: Secondary | ICD-10-CM | POA: Diagnosis not present

## 2017-06-09 ENCOUNTER — Other Ambulatory Visit: Payer: Self-pay | Admitting: Cardiology

## 2017-06-18 DIAGNOSIS — I4891 Unspecified atrial fibrillation: Secondary | ICD-10-CM | POA: Diagnosis not present

## 2017-06-18 DIAGNOSIS — Z23 Encounter for immunization: Secondary | ICD-10-CM | POA: Diagnosis not present

## 2017-07-01 DIAGNOSIS — I482 Chronic atrial fibrillation: Secondary | ICD-10-CM | POA: Diagnosis not present

## 2017-07-01 DIAGNOSIS — F419 Anxiety disorder, unspecified: Secondary | ICD-10-CM | POA: Diagnosis not present

## 2017-07-01 DIAGNOSIS — R42 Dizziness and giddiness: Secondary | ICD-10-CM | POA: Diagnosis not present

## 2017-07-01 DIAGNOSIS — E7800 Pure hypercholesterolemia, unspecified: Secondary | ICD-10-CM | POA: Diagnosis not present

## 2017-07-01 DIAGNOSIS — I10 Essential (primary) hypertension: Secondary | ICD-10-CM | POA: Diagnosis not present

## 2017-07-01 DIAGNOSIS — I509 Heart failure, unspecified: Secondary | ICD-10-CM | POA: Diagnosis not present

## 2017-07-03 DIAGNOSIS — I10 Essential (primary) hypertension: Secondary | ICD-10-CM | POA: Diagnosis not present

## 2017-07-03 DIAGNOSIS — F419 Anxiety disorder, unspecified: Secondary | ICD-10-CM | POA: Diagnosis not present

## 2017-07-03 DIAGNOSIS — Z6822 Body mass index (BMI) 22.0-22.9, adult: Secondary | ICD-10-CM | POA: Diagnosis not present

## 2017-07-03 DIAGNOSIS — G4700 Insomnia, unspecified: Secondary | ICD-10-CM | POA: Diagnosis not present

## 2017-07-03 DIAGNOSIS — I482 Chronic atrial fibrillation: Secondary | ICD-10-CM | POA: Diagnosis not present

## 2017-07-07 ENCOUNTER — Other Ambulatory Visit: Payer: Self-pay | Admitting: Cardiology

## 2017-07-08 ENCOUNTER — Other Ambulatory Visit: Payer: Self-pay | Admitting: Cardiology

## 2017-07-30 DIAGNOSIS — Z6823 Body mass index (BMI) 23.0-23.9, adult: Secondary | ICD-10-CM | POA: Diagnosis not present

## 2017-07-30 DIAGNOSIS — J069 Acute upper respiratory infection, unspecified: Secondary | ICD-10-CM | POA: Diagnosis not present

## 2017-07-30 DIAGNOSIS — I482 Chronic atrial fibrillation: Secondary | ICD-10-CM | POA: Diagnosis not present

## 2017-08-06 DIAGNOSIS — I482 Chronic atrial fibrillation: Secondary | ICD-10-CM | POA: Diagnosis not present

## 2017-08-13 DIAGNOSIS — J90 Pleural effusion, not elsewhere classified: Secondary | ICD-10-CM | POA: Diagnosis not present

## 2017-08-13 DIAGNOSIS — I517 Cardiomegaly: Secondary | ICD-10-CM | POA: Diagnosis not present

## 2017-08-13 DIAGNOSIS — R05 Cough: Secondary | ICD-10-CM | POA: Diagnosis not present

## 2017-09-02 DIAGNOSIS — R0600 Dyspnea, unspecified: Secondary | ICD-10-CM | POA: Diagnosis not present

## 2017-09-02 DIAGNOSIS — I10 Essential (primary) hypertension: Secondary | ICD-10-CM | POA: Diagnosis not present

## 2017-09-02 DIAGNOSIS — I482 Chronic atrial fibrillation: Secondary | ICD-10-CM | POA: Diagnosis not present

## 2017-09-02 DIAGNOSIS — Z6823 Body mass index (BMI) 23.0-23.9, adult: Secondary | ICD-10-CM | POA: Diagnosis not present

## 2017-09-09 ENCOUNTER — Ambulatory Visit: Payer: Medicare Other | Admitting: Cardiology

## 2017-09-10 DIAGNOSIS — N2889 Other specified disorders of kidney and ureter: Secondary | ICD-10-CM | POA: Diagnosis not present

## 2017-09-10 DIAGNOSIS — J479 Bronchiectasis, uncomplicated: Secondary | ICD-10-CM | POA: Diagnosis not present

## 2017-09-10 DIAGNOSIS — I700 Atherosclerosis of aorta: Secondary | ICD-10-CM | POA: Diagnosis not present

## 2017-09-10 DIAGNOSIS — R918 Other nonspecific abnormal finding of lung field: Secondary | ICD-10-CM | POA: Diagnosis not present

## 2017-09-10 DIAGNOSIS — I2510 Atherosclerotic heart disease of native coronary artery without angina pectoris: Secondary | ICD-10-CM | POA: Diagnosis not present

## 2017-09-10 DIAGNOSIS — R0600 Dyspnea, unspecified: Secondary | ICD-10-CM | POA: Diagnosis not present

## 2017-09-16 DIAGNOSIS — K8020 Calculus of gallbladder without cholecystitis without obstruction: Secondary | ICD-10-CM | POA: Diagnosis not present

## 2017-09-16 DIAGNOSIS — N281 Cyst of kidney, acquired: Secondary | ICD-10-CM | POA: Diagnosis not present

## 2017-09-30 DIAGNOSIS — I482 Chronic atrial fibrillation: Secondary | ICD-10-CM | POA: Diagnosis not present

## 2017-10-01 ENCOUNTER — Other Ambulatory Visit: Payer: Self-pay

## 2017-10-01 ENCOUNTER — Encounter: Payer: Self-pay | Admitting: *Deleted

## 2017-10-01 ENCOUNTER — Ambulatory Visit (INDEPENDENT_AMBULATORY_CARE_PROVIDER_SITE_OTHER): Payer: Medicare Other | Admitting: Cardiology

## 2017-10-01 ENCOUNTER — Encounter: Payer: Self-pay | Admitting: Cardiology

## 2017-10-01 VITALS — BP 109/74 | HR 78 | Ht 60.0 in | Wt 117.0 lb

## 2017-10-01 DIAGNOSIS — E782 Mixed hyperlipidemia: Secondary | ICD-10-CM

## 2017-10-01 DIAGNOSIS — R0789 Other chest pain: Secondary | ICD-10-CM | POA: Diagnosis not present

## 2017-10-01 DIAGNOSIS — R0602 Shortness of breath: Secondary | ICD-10-CM

## 2017-10-01 DIAGNOSIS — I4891 Unspecified atrial fibrillation: Secondary | ICD-10-CM | POA: Diagnosis not present

## 2017-10-01 NOTE — Patient Instructions (Signed)

## 2017-10-01 NOTE — Progress Notes (Signed)
Clinical Summary [name] [name] is a 84 y.o.female seen today for follow up of the following medical problems.    1. SOB - echo 12/2016 LVEF 60-65%, no WMAs, cannot eval diastolic function but severe LAE, moderate MR, +PFO. - completed CXR and PFTs that were overall benign  - breathing overall stable since last visit.   2. Afib - last visit we increased lopressor to 25mg  bid due to palpitaitons. Still with symptoms at times, we then increased to 50mg  bid - 10/2016 heart monitor rate controlled afib.   - no recent palpitations - compliant with meds - no bleeding on coumadin. INRs followed by Dr Nadara Mustard.   3. Chest pain  - reports several years history of intermittent chest pain - cath 2009 patent coronaries - recent repeat echo with normal LVEF, no wall motion abnormalities.  - no symptoms since last visit  4. Carotid stenosis - 09/2014 carotid US with stable bilateral 1-39% stenosis.  - no recent symptoms.  5. Hyperlipidemia - she is compliant with statin  Has upcoming labs in May.  Past Medical History:  Diagnosis Date  . Abnormal EKG    low voltage (no amyloid)  . Anxiety    May play a role with some symptoms  . Aortic valve sclerosis    Echo  04/2009  EF 65%.no stenosis.... echo... September, 2010  . Arthritis   . Atrial fibrillation ([place])    DCCV prior to 04/05/2009 but reverted A.Fib, consultation w/Dr.Klein, decision to use Propafenone and   cardiovert again..cardioversion June 08, 2009.[name] on 4 days Propafenone... 100 Joules... normal sinus rhythm... Cardizem stopped.. /  return to atrial fib.[name]   /   cardionet on cardizem...09/14/2009...rates  52-190.  On Coumadin  . Back pain    February, 2013  . Carotid stenosis    Dopplers 2009 better than past; Dopplers 09/07/2009 <50% bilateral-stable  . Chest pain    cardiac catheterization.. 2009... no significant CAD.[name] / recurrent chest pain 2010.. consider; EF 65% by Echo 04/2009  . Drug therapy    coumadin  .  Dyspnea   . Ejection fraction   . GERD (gastroesophageal reflux disease)    Aspirin Stopped  . H/O Spinal surgery    Successful,  2013  . Hyperlipidemia   . Hypertension   . Shortness of breath    Shortness of breath and fatigue June, 2012  . Visual changes    Prior planned to have ophthalmology evlaution     Allergies  Allergen Reactions  . Tylenol [Acetaminophen]     blisters     Current Outpatient Medications  Medication Sig Dispense Refill  . Calcium Carbonate-Vitamin D (CALCIUM 600+D) 600-400 MG-UNIT per tablet Take 1 tablet by mouth 2 (two) times daily.      [name] diltiazem (CARDIZEM) 60 MG tablet Take 60 mg by mouth 3 (three) times daily.    . furosemide (LASIX) 40 MG tablet TAKE 1 TABLET BY MOUTH EVERY DAY 90 tablet 3  . metoprolol tartrate (LOPRESSOR) 50 MG tablet TAKE 1 TABLET BY MOUTH TWICE DAILY- OFFICE VISIT REQUIRED FOR FURTHER REFILLS 180 tablet 0  . Multiple Vitamin (MULTIVITAMIN) tablet Take 1 tablet by mouth daily.      . simvastatin (ZOCOR) 20 MG tablet Take 20 mg by mouth at bedtime.     . traMADol (ULTRAM) 50 MG tablet Take 25 mg by mouth every 12 (twelve) hours as needed.    . warfarin (COUMADIN) 1 MG tablet Take 1 tablet by mouth as  directed. MANAGED BY DR. Nadara Mustard  0   No current facility-administered medications for this visit.      Past Surgical History:  Procedure Laterality Date  . BACK SURGERY     X 2, most recent 2009  . CATARACT EXTRACTION, BILATERAL       Allergies  Allergen Reactions  . Tylenol [Acetaminophen]     blisters      Family History  Problem Relation Age of Onset  . Cancer Other   . Coronary artery disease Other      Social History Ms. Kock reports that  has never smoked. she has never used smokeless tobacco. Ms. Burek reports that she does not drink alcohol.   Review of Systems CONSTITUTIONAL: No weight loss, fever, chills, weakness or fatigue.  HEENT: Eyes: No visual loss, blurred vision, double vision or  yellow sclerae.No hearing loss, sneezing, congestion, runny nose or sore throat.  SKIN: No rash or itching.  CARDIOVASCULAR: per hpi RESPIRATORY: per hpi GASTROINTESTINAL: No anorexia, nausea, vomiting or diarrhea. No abdominal pain or blood.  GENITOURINARY: No burning on urination, no polyuria NEUROLOGICAL: No headache, dizziness, syncope, paralysis, ataxia, numbness or tingling in the extremities. No change in bowel or bladder control.  MUSCULOSKELETAL: No muscle, back pain, joint pain or stiffness.  LYMPHATICS: No enlarged nodes. No history of splenectomy.  PSYCHIATRIC: No history of depression or anxiety.  ENDOCRINOLOGIC: No reports of sweating, cold or heat intolerance. No polyuria or polydipsia.  [name]   Physical Examination Vitals:   10/01/17 1102  BP: 109/74  Pulse: 78  SpO2: 94%   Vitals:   10/01/17 1102  Weight: 117 lb (53.1 kg)  Height: 5' (1.524 m)    Gen: resting comfortably, no acute distress HEENT: no scleral icterus, pupils equal round and reactive, no palptable cervical adenopathy,  CV: RRR, no m/rg, no jvd Resp: Clear to auscultation bilaterally GI: abdomen is soft, non-tender, non-distended, normal bowel sounds, no hepatosplenomegaly MSK: extremities are warm, no edema.  Skin: warm, no rash Neuro:  no focal deficits Psych: appropriate affect   Diagnostic Studies 2009 Cath HOSPITAL COURSE: Left heart cardiac catheterization was undertaken this  morning revealing normal coronary arteries with normal LV function  without regional wall motion abnormalities.     01/2014 [facility] LVEF 55-60%, moderate MR,   03/2012 Carotid US No significant stenosis  09/2014 Carotid US 1-39% bilateral disease   01/2016 echo [facility]: LVEF 55-60%, mod LAE, mod RAE, mild to mod MR  12/2016 CXR IMPRESSION: No acute abnormality noted. Lungs are hyperinflated suggesting COPD   12/2016 PFTs: normal  12/2016 echo Study Conclusions  - Left ventricle:  The cavity size was normal. Wall thickness was normal. Systolic function was normal. The estimated ejection fraction was in the range of 60% to 65%. Wall motion was normal; there were no regional wall motion abnormalities. The study is not technically sufficient to allow evaluation of LV diastolic function. - Aortic valve: Mildly calcified annulus. Trileaflet; mildly calcified leaflets. - Mitral valve: Mildly to moderately calcified annulus. Mildly calcified leaflets . There was moderate regurgitation. - Left atrium: The atrium was severely dilated. - Right atrium: The atrium was moderately dilated. - Atrial septum: There was a secundum ASD versus patent foramen ovale present with intermittent small left to right shunt noted by color Doppler. - Tricuspid valve: There was mild regurgitation. - Pulmonary arteries: PA peak pressure: 27 mm Hg (S). - Pericardium, extracardiac: There was no pericardial effusion.  Impressions:  - Normal LV wall thickness  with LVEF 60-65%. Indeterminate diastolic function in the setting of atrial fibrillation. Severe left atrial enlargement and moderate right atrial enlargement. Calcified mitral annulus with moderate mitral regurgitation. Sclerotic aortic valve without stenosis. Mild tricuspid regurgitation with PASP 27 mmHg. Secundum ASD versus PFO with small degree of intermittent left to right shunt by color Doppler.    Assessment and Plan   1. SOB - unclear etiology. Echo with normal LVEF, evidence of some diastolic dysfunction, mod MR. Pulmonary workup unremarkable. Follow symptoms at this time as they are somewhat improved with increased activity. Would not pursue ischemic testing at this time in absence of chest pain with some improvement in her symptoms.  - symptoms overall stable, continue to monitor at this time. In absence of significant chest pain or progressing symptoms would not pursue ischemic evaluation  given her advanced age and increased risk for complicatons.   2. Afib  - CHADS2Vasc score is 4, continue anticoagulation.  - no recent symptms, continue current meds   3. Chest pain  - long history of chest pain. negative cath in 2009  - no recent symptoms, we will continue to monitor.    4. Carotid stenosis -  Mild by [place]. We have elected not to repeat US at this time.   5. Hyperlipidemia -continue staitn.    6. HTN -at goal, continue current meds  F/u 6 months.      Arnoldo Lenis, M.D.

## 2017-10-04 ENCOUNTER — Encounter: Payer: Self-pay | Admitting: Cardiology

## 2017-11-11 DIAGNOSIS — I482 Chronic atrial fibrillation: Secondary | ICD-10-CM | POA: Diagnosis not present

## 2017-11-21 DIAGNOSIS — H40013 Open angle with borderline findings, low risk, bilateral: Secondary | ICD-10-CM | POA: Diagnosis not present

## 2017-12-23 DIAGNOSIS — I4891 Unspecified atrial fibrillation: Secondary | ICD-10-CM | POA: Diagnosis not present

## 2017-12-30 DIAGNOSIS — I509 Heart failure, unspecified: Secondary | ICD-10-CM | POA: Diagnosis not present

## 2017-12-30 DIAGNOSIS — R42 Dizziness and giddiness: Secondary | ICD-10-CM | POA: Diagnosis not present

## 2017-12-30 DIAGNOSIS — I482 Chronic atrial fibrillation: Secondary | ICD-10-CM | POA: Diagnosis not present

## 2017-12-30 DIAGNOSIS — I10 Essential (primary) hypertension: Secondary | ICD-10-CM | POA: Diagnosis not present

## 2017-12-30 DIAGNOSIS — N2889 Other specified disorders of kidney and ureter: Secondary | ICD-10-CM | POA: Diagnosis not present

## 2017-12-30 DIAGNOSIS — E7800 Pure hypercholesterolemia, unspecified: Secondary | ICD-10-CM | POA: Diagnosis not present

## 2018-01-01 DIAGNOSIS — E7800 Pure hypercholesterolemia, unspecified: Secondary | ICD-10-CM | POA: Diagnosis not present

## 2018-01-01 DIAGNOSIS — I10 Essential (primary) hypertension: Secondary | ICD-10-CM | POA: Diagnosis not present

## 2018-01-01 DIAGNOSIS — Z6822 Body mass index (BMI) 22.0-22.9, adult: Secondary | ICD-10-CM | POA: Diagnosis not present

## 2018-01-01 DIAGNOSIS — I482 Chronic atrial fibrillation: Secondary | ICD-10-CM | POA: Diagnosis not present

## 2018-01-13 DIAGNOSIS — Z79899 Other long term (current) drug therapy: Secondary | ICD-10-CM | POA: Diagnosis not present

## 2018-01-13 DIAGNOSIS — N390 Urinary tract infection, site not specified: Secondary | ICD-10-CM | POA: Diagnosis not present

## 2018-01-13 DIAGNOSIS — I10 Essential (primary) hypertension: Secondary | ICD-10-CM | POA: Diagnosis not present

## 2018-01-13 DIAGNOSIS — R0602 Shortness of breath: Secondary | ICD-10-CM | POA: Diagnosis not present

## 2018-01-13 DIAGNOSIS — I4891 Unspecified atrial fibrillation: Secondary | ICD-10-CM | POA: Diagnosis not present

## 2018-01-13 DIAGNOSIS — R531 Weakness: Secondary | ICD-10-CM | POA: Diagnosis not present

## 2018-01-13 DIAGNOSIS — Z7901 Long term (current) use of anticoagulants: Secondary | ICD-10-CM | POA: Diagnosis not present

## 2018-01-27 DIAGNOSIS — D0362 Melanoma in situ of left upper limb, including shoulder: Secondary | ICD-10-CM | POA: Diagnosis not present

## 2018-01-27 DIAGNOSIS — C44619 Basal cell carcinoma of skin of left upper limb, including shoulder: Secondary | ICD-10-CM | POA: Diagnosis not present

## 2018-01-27 DIAGNOSIS — I482 Chronic atrial fibrillation: Secondary | ICD-10-CM | POA: Diagnosis not present

## 2018-01-27 DIAGNOSIS — C4491 Basal cell carcinoma of skin, unspecified: Secondary | ICD-10-CM | POA: Diagnosis not present

## 2018-02-12 DIAGNOSIS — I482 Chronic atrial fibrillation: Secondary | ICD-10-CM | POA: Diagnosis not present

## 2018-02-12 DIAGNOSIS — C44619 Basal cell carcinoma of skin of left upper limb, including shoulder: Secondary | ICD-10-CM | POA: Diagnosis not present

## 2018-03-19 DIAGNOSIS — I482 Chronic atrial fibrillation: Secondary | ICD-10-CM | POA: Diagnosis not present

## 2018-04-07 ENCOUNTER — Ambulatory Visit (INDEPENDENT_AMBULATORY_CARE_PROVIDER_SITE_OTHER): Payer: Medicare Other | Admitting: Cardiology

## 2018-04-07 ENCOUNTER — Other Ambulatory Visit: Payer: Self-pay

## 2018-04-07 ENCOUNTER — Encounter: Payer: Self-pay | Admitting: Cardiology

## 2018-04-07 ENCOUNTER — Encounter: Payer: Self-pay | Admitting: *Deleted

## 2018-04-07 VITALS — BP 103/55 | HR 63 | Ht 62.0 in | Wt 115.0 lb

## 2018-04-07 DIAGNOSIS — E782 Mixed hyperlipidemia: Secondary | ICD-10-CM

## 2018-04-07 DIAGNOSIS — I10 Essential (primary) hypertension: Secondary | ICD-10-CM | POA: Diagnosis not present

## 2018-04-07 DIAGNOSIS — R0602 Shortness of breath: Secondary | ICD-10-CM

## 2018-04-07 DIAGNOSIS — I4891 Unspecified atrial fibrillation: Secondary | ICD-10-CM | POA: Diagnosis not present

## 2018-04-07 NOTE — Progress Notes (Signed)
Clinical Summary [name] [name] is a 84 y.o.female seen today for follow up of the following medical problems.    1. SOB - echo 12/2016 LVEF 60-65%, no WMAs, cannot eval diastolic functionbut severe LAE, moderate MR, +PFO. - completed CXR and PFTs that were overall benign   - SOB has improved since last visit.    2. Afib - last visit we increased lopressor to 25mg  bid due to palpitaitons. Still with symptoms at times, we then increased to 50mg  bid - 10/2016 heart monitor rate controlled afib.   - no recent palpitations. No bleeding issues on coumadin. Not interested in DOACs. INRs followed by Dr Nadara Mustard.   3. Chest pain  - reports several years history of intermittent chest pain - cath 2009 patent coronaries - recent repeat echo with normal LVEF, no wall motion abnormalities.  - denies any recent symptoms.   4. Carotid stenosis - 09/2014 carotid US with stable bilateral 1-39% stenosis.  - no recent neuro symptoms.   5. Hyperlipidemia - 06/2017 TC 170 TG 75 HDL 82 LDL 73 - compliant with statin.     Past Medical History:  Diagnosis Date  . Abnormal EKG    low voltage (no amyloid)  . Anxiety    May play a role with some symptoms  . Aortic valve sclerosis    Echo  04/2009  EF 65%.no stenosis.... echo... September, 2010  . Arthritis   . Atrial fibrillation ([place])    DCCV prior to 04/05/2009 but reverted A.Fib, consultation w/Dr.Klein, decision to use Propafenone and   cardiovert again..cardioversion June 08, 2009.[name] on 4 days Propafenone... 100 Joules... normal sinus rhythm... Cardizem stopped.. /  return to atrial fib.[name]   /   cardionet on cardizem...09/14/2009...rates  52-190.  On Coumadin  . Back pain    February, 2013  . Carotid stenosis    Dopplers 2009 better than past; Dopplers 09/07/2009 <50% bilateral-stable  . Chest pain    cardiac catheterization.. 2009... no significant CAD.[name] / recurrent chest pain 2010.. consider; EF 65% by Echo 04/2009  . Drug  therapy    coumadin  . Dyspnea   . Ejection fraction   . GERD (gastroesophageal reflux disease)    Aspirin Stopped  . H/O Spinal surgery    Successful,  2013  . Hyperlipidemia   . Hypertension   . Shortness of breath    Shortness of breath and fatigue June, 2012  . Visual changes    Prior planned to have ophthalmology evlaution     Allergies  Allergen Reactions  . Tylenol [Acetaminophen]     blisters     Current Outpatient Medications  Medication Sig Dispense Refill  . Calcium Carbonate-Vitamin D (CALCIUM 600+D) 600-400 MG-UNIT per tablet Take 1 tablet by mouth 2 (two) times daily.      [name] diltiazem (CARDIZEM) 60 MG tablet Take 60 mg by mouth 3 (three) times daily.    . furosemide (LASIX) 40 MG tablet TAKE 1 TABLET BY MOUTH EVERY DAY 90 tablet 3  . metoprolol tartrate (LOPRESSOR) 50 MG tablet TAKE 1 TABLET BY MOUTH TWICE DAILY- OFFICE VISIT REQUIRED FOR FURTHER REFILLS 180 tablet 0  . Multiple Vitamin (MULTIVITAMIN) tablet Take 1 tablet by mouth daily.      . simvastatin (ZOCOR) 20 MG tablet Take 20 mg by mouth at bedtime.     . traMADol (ULTRAM) 50 MG tablet Take 25 mg by mouth every 12 (twelve) hours as needed.    . warfarin (COUMADIN) 1  MG tablet Take 1 tablet by mouth as directed. MANAGED BY DR. Nadara Mustard  0   No current facility-administered medications for this visit.      Past Surgical History:  Procedure Laterality Date  . BACK SURGERY     X 2, most recent 2009  . CATARACT EXTRACTION, BILATERAL       Allergies  Allergen Reactions  . Tylenol [Acetaminophen]     blisters      Family History  Problem Relation Age of Onset  . Cancer Other   . Coronary artery disease Other      Social History Ms. Piloto reports that she has never smoked. She has never used smokeless tobacco. Ms. Borden reports that she does not drink alcohol.   Review of Systems CONSTITUTIONAL: No weight loss, fever, chills, weakness or fatigue.  HEENT: Eyes: No visual loss,  blurred vision, double vision or yellow sclerae.No hearing loss, sneezing, congestion, runny nose or sore throat.  SKIN: No rash or itching.  CARDIOVASCULAR: per hpi RESPIRATORY: No shortness of breath, cough or sputum.  GASTROINTESTINAL: No anorexia, nausea, vomiting or diarrhea. No abdominal pain or blood.  GENITOURINARY: No burning on urination, no polyuria NEUROLOGICAL: No headache, dizziness, syncope, paralysis, ataxia, numbness or tingling in the extremities. No change in bowel or bladder control.  MUSCULOSKELETAL: No muscle, back pain, joint pain or stiffness.  LYMPHATICS: No enlarged nodes. No history of splenectomy.  PSYCHIATRIC: No history of depression or anxiety.  ENDOCRINOLOGIC: No reports of sweating, cold or heat intolerance. No polyuria or polydipsia.  [name]   Physical Examination Vitals:   04/07/18 1045  BP: (!) 103/55  Pulse: 63  SpO2: 93%   Vitals:   04/07/18 1045  Weight: 115 lb (52.2 kg)  Height: 5\' 2"  (1.575 m)    Gen: resting comfortably, no acute distress HEENT: no scleral icterus, pupils equal round and reactive, no palptable cervical adenopathy,  CV: irreg, 2/6 systolic murmur apex, no jvd Resp: Clear to auscultation bilaterally GI: abdomen is soft, non-tender, non-distended, normal bowel sounds, no hepatosplenomegaly MSK: extremities are warm, no edema.  Skin: warm, no rash Neuro:  no focal deficits Psych: appropriate affect   Diagnostic Studies 2009 Cath HOSPITAL COURSE: Left heart cardiac catheterization was undertaken this  morning revealing normal coronary arteries with normal LV function  without regional wall motion abnormalities.     01/2014 Echo [facility] LVEF 55-60%, moderate MR,   03/2012 Carotid US No significant stenosis  09/2014 Carotid US 1-39% bilateral disease   01/2016 echo [facility]: LVEF 55-60%, mod LAE, mod RAE, mild to mod MR  12/2016 CXR IMPRESSION: No acute abnormality noted.Lungs are hyperinflated  suggesting COPD   12/2016 PFTs: normal  12/2016 echo Study Conclusions  - Left ventricle: The cavity size was normal. Wall thickness was normal. Systolic function was normal. The estimated ejection fraction was in the range of 60% to 65%. Wall motion was normal; there were no regional wall motion abnormalities. The study is not technically sufficient to allow evaluation of LV diastolic function. - Aortic valve: Mildly calcified annulus. Trileaflet; mildly calcified leaflets. - Mitral valve: Mildly to moderately calcified annulus. Mildly calcified leaflets . There was moderate regurgitation. - Left atrium: The atrium was severely dilated. - Right atrium: The atrium was moderately dilated. - Atrial septum: There was a secundum ASD versus patent foramen ovale present with intermittent small left to right shunt noted by color Doppler. - Tricuspid valve: There was mild regurgitation. - Pulmonary arteries: PA peak pressure: 27 mm Hg (  S). - Pericardium, extracardiac: There was no pericardial effusion.  Impressions:  - Normal LV wall thickness with LVEF 60-65%. Indeterminate diastolic function in the setting of atrial fibrillation. Severe left atrial enlargement and moderate right atrial enlargement. Calcified mitral annulus with moderate mitral regurgitation. Sclerotic aortic valve without stenosis. Mild tricuspid regurgitation with PASP 27 mmHg. Secundum ASD versus PFO with small degree of intermittent left to right shunt by color Doppler.    Assessment and Plan   1.SOB - fairly benign extensive workup last year. Symptoms are imrpoving, continue to monitor at this time.    2. Afib  - CHADS2Vasc score is 4, continue anticoagulation.  - doing well, continue current meds.SHe is not interested in DOACs.    3. Hyperlipidemia -she is at goal, continue current meds   4. HTN - she is at goal, continue current meds  F/u 6  months.     Arnoldo Lenis, M.D.

## 2018-04-07 NOTE — Patient Instructions (Signed)

## 2018-04-30 DIAGNOSIS — K625 Hemorrhage of anus and rectum: Secondary | ICD-10-CM | POA: Diagnosis not present

## 2018-06-11 DIAGNOSIS — I4891 Unspecified atrial fibrillation: Secondary | ICD-10-CM | POA: Diagnosis not present

## 2018-06-21 ENCOUNTER — Other Ambulatory Visit: Payer: Self-pay | Admitting: Cardiology

## 2018-07-02 DIAGNOSIS — I10 Essential (primary) hypertension: Secondary | ICD-10-CM | POA: Diagnosis not present

## 2018-07-02 DIAGNOSIS — R42 Dizziness and giddiness: Secondary | ICD-10-CM | POA: Diagnosis not present

## 2018-07-02 DIAGNOSIS — I509 Heart failure, unspecified: Secondary | ICD-10-CM | POA: Diagnosis not present

## 2018-07-07 DIAGNOSIS — I10 Essential (primary) hypertension: Secondary | ICD-10-CM | POA: Diagnosis not present

## 2018-07-07 DIAGNOSIS — Z0001 Encounter for general adult medical examination with abnormal findings: Secondary | ICD-10-CM | POA: Diagnosis not present

## 2018-07-07 DIAGNOSIS — Z23 Encounter for immunization: Secondary | ICD-10-CM | POA: Diagnosis not present

## 2018-07-07 DIAGNOSIS — Z6822 Body mass index (BMI) 22.0-22.9, adult: Secondary | ICD-10-CM | POA: Diagnosis not present

## 2018-07-13 DIAGNOSIS — I4891 Unspecified atrial fibrillation: Secondary | ICD-10-CM | POA: Diagnosis not present

## 2018-07-20 DIAGNOSIS — I482 Chronic atrial fibrillation, unspecified: Secondary | ICD-10-CM | POA: Diagnosis not present

## 2018-07-30 DIAGNOSIS — I482 Chronic atrial fibrillation, unspecified: Secondary | ICD-10-CM | POA: Diagnosis not present

## 2018-07-30 DIAGNOSIS — R0602 Shortness of breath: Secondary | ICD-10-CM | POA: Diagnosis not present

## 2018-07-30 DIAGNOSIS — Z6822 Body mass index (BMI) 22.0-22.9, adult: Secondary | ICD-10-CM | POA: Diagnosis not present

## 2018-08-14 DIAGNOSIS — I482 Chronic atrial fibrillation, unspecified: Secondary | ICD-10-CM | POA: Diagnosis not present

## 2018-09-09 DIAGNOSIS — R0602 Shortness of breath: Secondary | ICD-10-CM | POA: Diagnosis not present

## 2018-09-09 DIAGNOSIS — I10 Essential (primary) hypertension: Secondary | ICD-10-CM | POA: Diagnosis not present

## 2018-09-09 DIAGNOSIS — I482 Chronic atrial fibrillation, unspecified: Secondary | ICD-10-CM | POA: Diagnosis not present

## 2018-09-09 DIAGNOSIS — Z6822 Body mass index (BMI) 22.0-22.9, adult: Secondary | ICD-10-CM | POA: Diagnosis not present

## 2018-10-20 ENCOUNTER — Encounter: Payer: Self-pay | Admitting: *Deleted

## 2018-10-20 ENCOUNTER — Ambulatory Visit (INDEPENDENT_AMBULATORY_CARE_PROVIDER_SITE_OTHER): Payer: Medicare Other | Admitting: Cardiology

## 2018-10-20 ENCOUNTER — Encounter: Payer: Self-pay | Admitting: Cardiology

## 2018-10-20 VITALS — BP 130/75 | HR 66 | Ht 62.0 in | Wt 114.6 lb

## 2018-10-20 DIAGNOSIS — R0602 Shortness of breath: Secondary | ICD-10-CM

## 2018-10-20 DIAGNOSIS — I4891 Unspecified atrial fibrillation: Secondary | ICD-10-CM

## 2018-10-20 DIAGNOSIS — E782 Mixed hyperlipidemia: Secondary | ICD-10-CM

## 2018-10-20 DIAGNOSIS — I10 Essential (primary) hypertension: Secondary | ICD-10-CM

## 2018-10-20 DIAGNOSIS — I482 Chronic atrial fibrillation, unspecified: Secondary | ICD-10-CM | POA: Diagnosis not present

## 2018-10-20 NOTE — Patient Instructions (Signed)

## 2018-10-20 NOTE — Progress Notes (Signed)
Clinical Summary [name] [name] is a 85 y.o.female seen today for follow up of the following medical problems.   1. SOB - echo 12/2016 LVEF 60-65%, no WMAs, cannot eval diastolic functionbut severe LAE, moderate MR, +PFO. - completed CXR and PFTs that were overall benign    - chronic SOB unchanged. Really only comes on with relatively higher levels of exertion based on her age, such as walking up a flight of stairs.   2. Afib  Not interested in DOACs. Has done well on coumadin  - rare palpitations, lasts short duration. No bleeding on coumadin.    3. Hyperlipidemia - most recent labs with pcp - compliant with statin.    Past Medical History:  Diagnosis Date  . Abnormal EKG    low voltage (no amyloid)  . Anxiety    May play a role with some symptoms  . Aortic valve sclerosis    Echo  04/2009  EF 65%.no stenosis.... echo... September, 2010  . Arthritis   . Atrial fibrillation ([place])    DCCV prior to 04/05/2009 but reverted A.Fib, consultation w/Dr.Klein, decision to use Propafenone and   cardiovert again..cardioversion June 08, 2009.[name] on 4 days Propafenone... 100 Joules... normal sinus rhythm... Cardizem stopped.. /  return to atrial fib.[name]   /   cardionet on cardizem...09/14/2009...rates  52-190.  On Coumadin  . Back pain    February, 2013  . Carotid stenosis    Dopplers 2009 better than past; Dopplers 09/07/2009 <50% bilateral-stable  . Chest pain    cardiac catheterization.. 2009... no significant CAD.[name] / recurrent chest pain 2010.. consider; EF 65% by Echo 04/2009  . Drug therapy    coumadin  . Dyspnea   . Ejection fraction   . GERD (gastroesophageal reflux disease)    Aspirin Stopped  . H/O Spinal surgery    Successful,  2013  . Hyperlipidemia   . Hypertension   . Shortness of breath    Shortness of breath and fatigue June, 2012  . Visual changes    Prior planned to have ophthalmology evlaution     Allergies  Allergen Reactions  . Tylenol  [Acetaminophen]     blisters     Current Outpatient Medications  Medication Sig Dispense Refill  . Calcium Carbonate-Vitamin D (CALCIUM 600+D) 600-400 MG-UNIT per tablet Take 1 tablet by mouth 2 (two) times daily.      [name] diltiazem (CARDIZEM) 60 MG tablet Take 60 mg by mouth 3 (three) times daily.    . furosemide (LASIX) 40 MG tablet TAKE 1 TABLET BY MOUTH EVERY DAY 90 tablet 1  . metoprolol tartrate (LOPRESSOR) 50 MG tablet TAKE 1 TABLET BY MOUTH TWICE DAILY- OFFICE VISIT REQUIRED FOR FURTHER REFILLS 180 tablet 0  . Multiple Vitamin (MULTIVITAMIN) tablet Take 1 tablet by mouth daily.      . simvastatin (ZOCOR) 20 MG tablet Take 20 mg by mouth at bedtime.     . traMADol (ULTRAM) 50 MG tablet Take 25 mg by mouth every 12 (twelve) hours as needed.    . warfarin (COUMADIN) 1 MG tablet Take 1 tablet by mouth as directed. MANAGED BY DR. Nadara Mustard  0   No current facility-administered medications for this visit.      Past Surgical History:  Procedure Laterality Date  . BACK SURGERY     X 2, most recent 2009  . CATARACT EXTRACTION, BILATERAL       Allergies  Allergen Reactions  . Tylenol [Acetaminophen]  blisters      Family History  Problem Relation Age of Onset  . Cancer Other   . Coronary artery disease Other      Social History [name] reports that she has never smoked. She has never used smokeless tobacco. [name] reports no history of alcohol use.   Review of Systems CONSTITUTIONAL: No weight loss, fever, chills, weakness or fatigue.  HEENT: Eyes: No visual loss, blurred vision, double vision or yellow sclerae.No hearing loss, sneezing, congestion, runny nose or sore throat.  SKIN: No rash or itching.  CARDIOVASCULAR: per hpi RESPIRATORY: No shortness of breath, cough or sputum.  GASTROINTESTINAL: No anorexia, nausea, vomiting or diarrhea. No abdominal pain or blood.  GENITOURINARY: No burning on urination, no polyuria NEUROLOGICAL: No headache,  dizziness, syncope, paralysis, ataxia, numbness or tingling in the extremities. No change in bowel or bladder control.  MUSCULOSKELETAL: No muscle, back pain, joint pain or stiffness.  LYMPHATICS: No enlarged nodes. No history of splenectomy.  PSYCHIATRIC: No history of depression or anxiety.  ENDOCRINOLOGIC: No reports of sweating, cold or heat intolerance. No polyuria or polydipsia.  [name]   Physical Examination Vitals:   10/20/18 1251  BP: 130/75  Pulse: 66  SpO2: 96%   Vitals:   10/20/18 1251  Weight: 114 lb 9.6 oz (52 kg)  Height: 5\' 2"  (1.575 m)    Gen: resting comfortably, no acute distress HEENT: no scleral icterus, pupils equal round and reactive, no palptable cervical adenopathy,  CV: irreg, no m/r/g, no jvd Resp: Clear to auscultation bilaterally GI: abdomen is soft, non-tender, non-distended, normal bowel sounds, no hepatosplenomegaly MSK: extremities are warm, no edema.  Skin: warm, no rash Neuro:  no focal deficits Psych: appropriate affect   Diagnostic Studies  2009 Cath HOSPITAL COURSE: Left heart cardiac catheterization was undertaken this  morning revealing normal coronary arteries with normal LV function  without regional wall motion abnormalities.     01/2014 [facility] LVEF 55-60%, moderate MR,   03/2012 Carotid US No significant stenosis  09/2014 Carotid US 1-39% bilateral disease   01/2016 echo [facility]: LVEF 55-60%, mod LAE, mod RAE, mild to mod MR  12/2016 CXR IMPRESSION: No acute abnormality noted.Lungs are hyperinflated suggesting COPD   12/2016 PFTs: normal  12/2016 echo Study Conclusions  - Left ventricle: The cavity size was normal. Wall thickness was normal. Systolic function was normal. The estimated ejection fraction was in the range of 60% to 65%. Wall motion was normal; there were no regional wall motion abnormalities. The study is not technically sufficient to allow evaluation of LV  diastolic function. - Aortic valve: Mildly calcified annulus. Trileaflet; mildly calcified leaflets. - Mitral valve: Mildly to moderately calcified annulus. Mildly calcified leaflets . There was moderate regurgitation. - Left atrium: The atrium was severely dilated. - Right atrium: The atrium was moderately dilated. - Atrial septum: There was a secundum ASD versus patent foramen ovale present with intermittent small left to right shunt noted by color Doppler. - Tricuspid valve: There was mild regurgitation. - Pulmonary arteries: PA peak pressure: 27 mm Hg (S). - Pericardium, extracardiac: There was no pericardial effusion.  Impressions:  - Normal LV wall thickness with LVEF 60-65%. Indeterminate diastolic function in the setting of atrial fibrillation. Severe left atrial enlargement and moderate right atrial enlargement. Calcified mitral annulus with moderate mitral regurgitation. Sclerotic aortic valve without stenosis. Mild tricuspid regurgitation with PASP 27 mmHg. Secundum ASD versus PFO with small degree of intermittent left to right shunt by color Doppler.  Assessment and Plan  1.SOB - benign extensive workup in 2018. Her exercise capacity based on her age I think is quite normal - continue to monitor at this time.    2. Afib  - rare symptoms, continue current meds. COntinue coumadin, she is not interested in DOACs   3. Hyperlipidemia -continue statin, request labs from pcp   4. HTN - at goal, continue current meds  F/u 6 months      Arnoldo Lenis, M.D.

## 2018-12-01 DIAGNOSIS — I482 Chronic atrial fibrillation, unspecified: Secondary | ICD-10-CM | POA: Diagnosis not present

## 2018-12-17 ENCOUNTER — Other Ambulatory Visit: Payer: Self-pay | Admitting: Cardiology

## 2018-12-30 DIAGNOSIS — I482 Chronic atrial fibrillation, unspecified: Secondary | ICD-10-CM | POA: Diagnosis not present

## 2019-01-22 DIAGNOSIS — W08XXXA Fall from other furniture, initial encounter: Secondary | ICD-10-CM | POA: Diagnosis not present

## 2019-01-22 DIAGNOSIS — Z6823 Body mass index (BMI) 23.0-23.9, adult: Secondary | ICD-10-CM | POA: Diagnosis not present

## 2019-01-22 DIAGNOSIS — S300XXA Contusion of lower back and pelvis, initial encounter: Secondary | ICD-10-CM | POA: Diagnosis not present

## 2019-02-10 DIAGNOSIS — I482 Chronic atrial fibrillation, unspecified: Secondary | ICD-10-CM | POA: Diagnosis not present

## 2019-02-27 IMAGING — DX DG CHEST 2V
2 series · 2 of 2 positions shown · non-contrast
Comparison: 11/12/2011

CLINICAL DATA: Shortness of Breath

EXAM:
CHEST  2 VIEW

[chest pa]
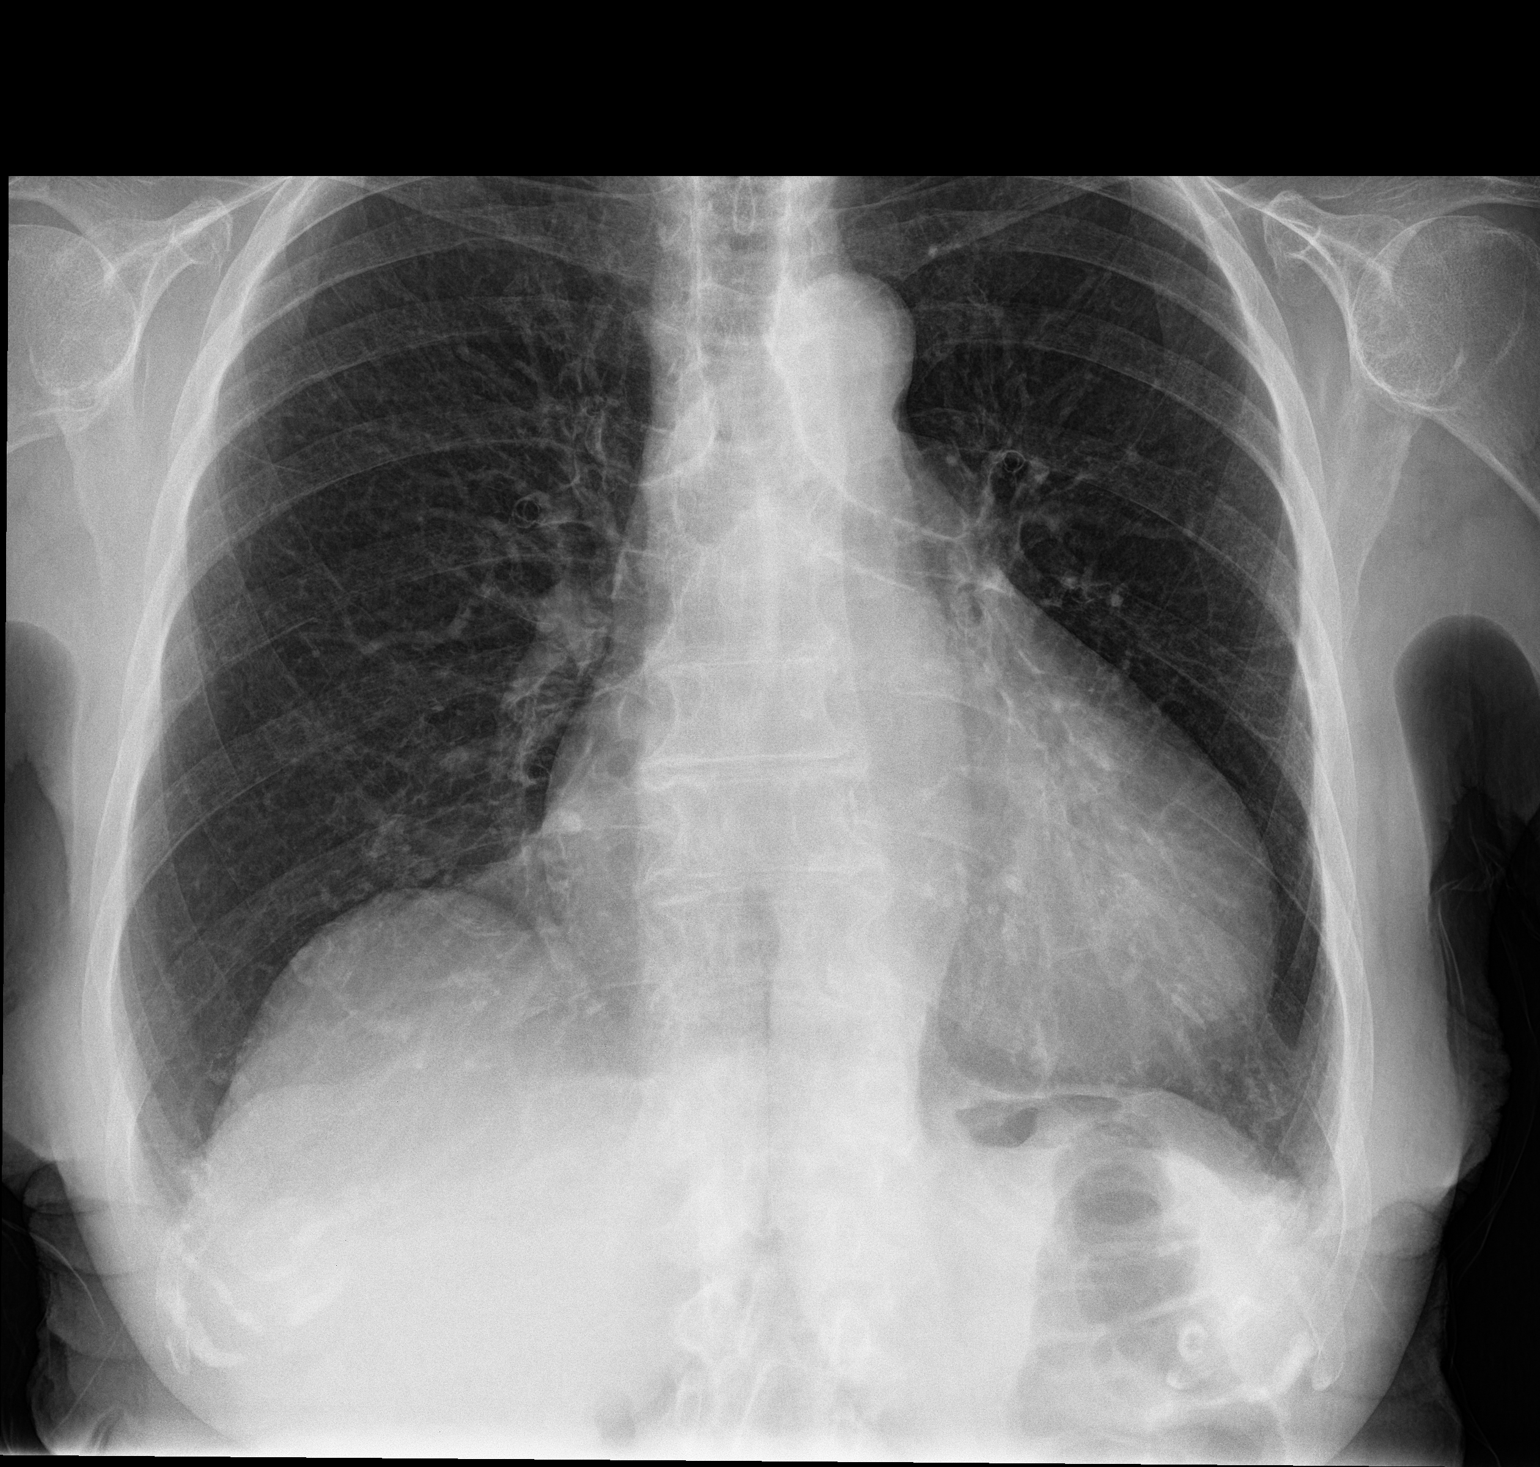

[chest lat]
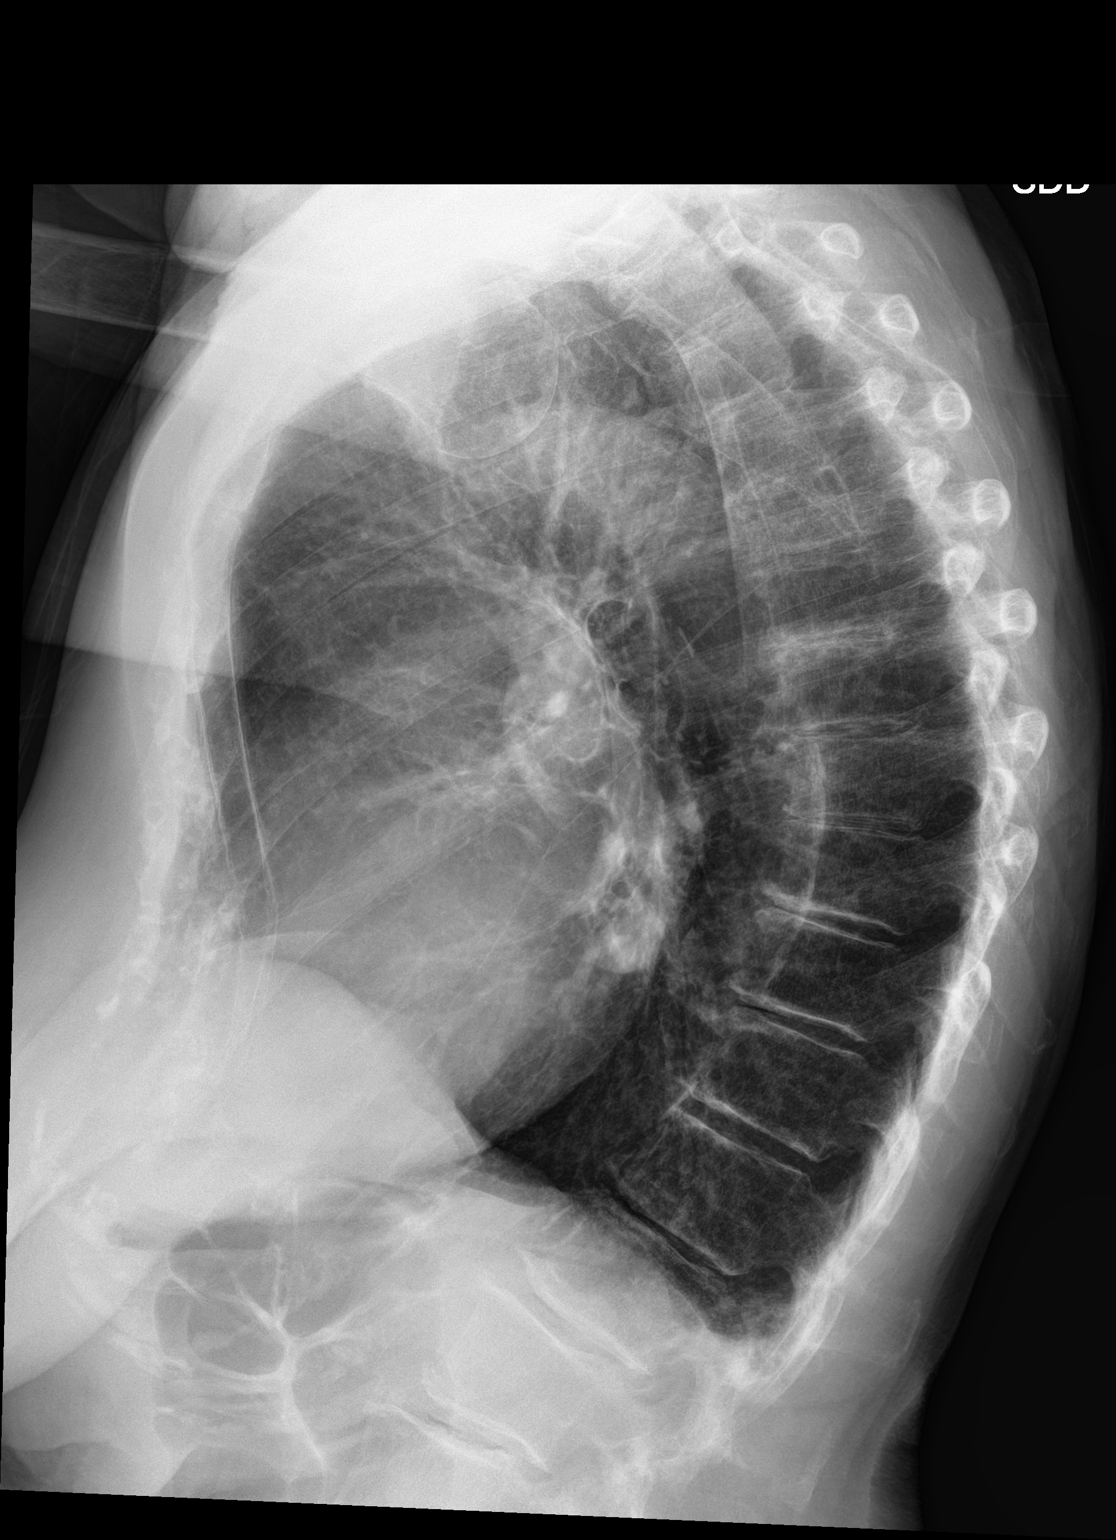

[2 of 2 positions shown; findings below may reference images not displayed]

FINDINGS: Cardiac shadow is enlarged. Lungs are hyperinflated consistent with
COPD. Mild degenerative changes of the thoracic spine are noted. No
acute infiltrate or effusion is seen.
IMPRESSION: No acute abnormality noted.

COPD.

## 2019-03-04 ENCOUNTER — Encounter: Payer: Self-pay | Admitting: Cardiology

## 2019-03-04 DIAGNOSIS — R5383 Other fatigue: Secondary | ICD-10-CM | POA: Diagnosis not present

## 2019-03-04 DIAGNOSIS — I482 Chronic atrial fibrillation, unspecified: Secondary | ICD-10-CM | POA: Diagnosis not present

## 2019-03-04 DIAGNOSIS — Z6822 Body mass index (BMI) 22.0-22.9, adult: Secondary | ICD-10-CM | POA: Diagnosis not present

## 2019-03-15 DIAGNOSIS — I482 Chronic atrial fibrillation, unspecified: Secondary | ICD-10-CM | POA: Diagnosis not present

## 2019-03-15 DIAGNOSIS — R5383 Other fatigue: Secondary | ICD-10-CM | POA: Diagnosis not present

## 2019-04-26 DIAGNOSIS — I482 Chronic atrial fibrillation, unspecified: Secondary | ICD-10-CM | POA: Diagnosis not present

## 2019-05-12 ENCOUNTER — Telehealth: Payer: Self-pay | Admitting: Cardiology

## 2019-05-12 NOTE — Telephone Encounter (Signed)
Virtual Visit Pre-Appointment Phone Call  "(Name), I am calling you today to discuss your upcoming appointment. We are currently trying to limit exposure to the virus that causes COVID-19 by seeing patients at home rather than in the office."  1. "What is the BEST phone number to call the day of the visit?" -   [phone] 2.  3. Do you have or have access to (through a family member/friend) a smartphone with video capability that we can use for your visit?" a. If yes - list this number in appt notes as cell (if different from BEST phone #) and list the appointment type as a VIDEO visit in appointment notes b. If no - list the appointment type as a PHONE visit in appointment notes  4. Confirm consent - "In the setting of the current Covid19 crisis, you are scheduled for a (phone or video) visit with your provider on (date) at (time).  Just as we do with many in-office visits, in order for you to participate in this visit, we must obtain consent.  If you'd like, I can send this to your mychart (if signed up) or email for you to review.  Otherwise, I can obtain your verbal consent now.  All virtual visits are billed to your insurance company just like a normal visit would be.  By agreeing to a virtual visit, we'd like you to understand that the technology does not allow for your provider to perform an examination, and thus may limit your provider's ability to fully assess your condition. If your provider identifies any concerns that need to be evaluated in person, we will make arrangements to do so.  Finally, though the technology is pretty good, we cannot assure that it will always work on either your or our end, and in the setting of a video visit, we may have to convert it to a phone-only visit.  In either situation, we cannot ensure that we have a secure connection.  Are you willing to proceed?" STAFF: Did the patient verbally acknowledge consent to telehealth visit? Document YES/NO here: YES     5. Advise patient to be prepared - "Two hours prior to your appointment, go ahead and check your blood pressure, pulse, oxygen saturation, and your weight (if you have the equipment to check those) and write them all down. When your visit starts, your provider will ask you for this information. If you have an Apple Watch or Kardia device, please plan to have heart rate information ready on the day of your appointment. Please have a pen and paper handy nearby the day of the visit as well."  6. Give patient instructions for MyChart download to smartphone OR [email or web address] as below if video visit (depending on what platform provider is using)  7. Inform patient they will receive a phone call 15 minutes prior to their appointment time (may be from unknown caller ID) so they should be prepared to answer    [place] has been deemed a candidate for a follow-up tele-health visit to limit community exposure during the Covid-19 pandemic. I spoke with the patient via phone to ensure availability of phone/video source, confirm preferred email & phone number, and discuss instructions and expectations.  I reminded [place] to be prepared with any vital sign and/or heart rhythm information that could potentially be obtained via home monitoring, at the time of her visit. I reminded [place] to expect a phone call prior  to her visit.  Chanda Busing 05/12/2019 9:37 AM   INSTRUCTIONS FOR DOWNLOADING THE MYCHART APP TO SMARTPHONE  - The patient must first make sure to have activated MyChart and know their login information - If Apple, go to [employer] and type in MyChart in the search bar and download the app. If Android, ask patient to go to [employer] and type in [facility] in the search bar and download the app. The app is free but as with any other app downloads, their phone may require them to verify saved payment information or Apple/Android password.  -  The patient will need to then log into the app with their MyChart username and password, and select [facility] as their healthcare provider to link the account. When it is time for your visit, go to the MyChart app, find appointments, and click Begin Video Visit. Be sure to Select Allow for your device to access the Microphone and Camera for your visit. You will then be connected, and your provider will be with you shortly.  **If they have any issues connecting, or need assistance please contact MyChart service desk [phone])**  **If using a computer, in order to ensure the best quality for their visit they will need to use either of the following Internet Browsers: [employer], or Google Chrome**  IF USING DOXIMITY or DOXY.ME - The patient will receive a link just prior to their visit by text.     FULL LENGTH CONSENT FOR TELE-HEALTH VISIT   I hereby voluntarily request, consent and authorize [facility] and its employed or contracted physicians, physician assistants, nurse practitioners or other licensed health care professionals (the Practitioner), to provide me with telemedicine health care services (the Services") as deemed necessary by the treating Practitioner. I acknowledge and consent to receive the Services by the Practitioner via telemedicine. I understand that the telemedicine visit will involve communicating with the Practitioner through live audiovisual communication technology and the disclosure of certain medical information by electronic transmission. I acknowledge that I have been given the opportunity to request an in-person assessment or other available alternative prior to the telemedicine visit and am voluntarily participating in the telemedicine visit.  I understand that I have the right to withhold or withdraw my consent to the use of telemedicine in the course of my care at any time, without affecting my right to future care or treatment, and that the  Practitioner or I may terminate the telemedicine visit at any time. I understand that I have the right to inspect all information obtained and/or recorded in the course of the telemedicine visit and may receive copies of available information for a reasonable fee.  I understand that some of the potential risks of receiving the Services via telemedicine include:   Delay or interruption in medical evaluation due to technological equipment failure or disruption;  Information transmitted may not be sufficient (e.g. poor resolution of images) to allow for appropriate medical decision making by the Practitioner; and/or   In rare instances, security protocols could fail, causing a breach of personal health information.  Furthermore, I acknowledge that it is my responsibility to provide information about my medical history, conditions and care that is complete and accurate to the best of my ability. I acknowledge that Practitioner's advice, recommendations, and/or decision may be based on factors not within their control, such as incomplete or inaccurate data provided by me or distortions of diagnostic images or specimens that may result from electronic transmissions.  I understand that the practice of medicine is not an exact science and that Practitioner makes no warranties or guarantees regarding treatment outcomes. I acknowledge that I will receive a copy of this consent concurrently upon execution via email to the email address I last provided but may also request a printed copy by calling the office of [facility].    I understand that my insurance will be billed for this visit.   I have read or had this consent read to me.  I understand the contents of this consent, which adequately explains the benefits and risks of the Services being provided via telemedicine.   I have been provided ample opportunity to ask questions regarding this consent and the Services and have had my questions answered to my  satisfaction.  I give my informed consent for the services to be provided through the use of telemedicine in my medical care  By participating in this telemedicine visit I agree to the above.

## 2019-05-13 ENCOUNTER — Encounter: Payer: Self-pay | Admitting: Cardiology

## 2019-05-13 ENCOUNTER — Encounter: Payer: Self-pay | Admitting: *Deleted

## 2019-05-13 ENCOUNTER — Telehealth (INDEPENDENT_AMBULATORY_CARE_PROVIDER_SITE_OTHER): Payer: Medicare Other | Admitting: Cardiology

## 2019-05-13 VITALS — BP 107/64 | HR 66 | Ht 60.0 in | Wt 112.0 lb

## 2019-05-13 DIAGNOSIS — R0602 Shortness of breath: Secondary | ICD-10-CM

## 2019-05-13 DIAGNOSIS — E782 Mixed hyperlipidemia: Secondary | ICD-10-CM

## 2019-05-13 DIAGNOSIS — I4891 Unspecified atrial fibrillation: Secondary | ICD-10-CM

## 2019-05-13 DIAGNOSIS — I10 Essential (primary) hypertension: Secondary | ICD-10-CM

## 2019-05-13 NOTE — Progress Notes (Signed)
Virtual Visit via Telephone Note   This visit type was conducted due to national recommendations for restrictions regarding the COVID-19 Pandemic (e.g. social distancing) in an effort to limit this patient's exposure and mitigate transmission in our community.  Due to her co-morbid illnesses, this patient is at least at moderate risk for complications without adequate follow up.  This format is felt to be most appropriate for this patient at this time.  The patient did not have access to video technology/had technical difficulties with video requiring transitioning to audio format only (telephone).  All issues noted in this document were discussed and addressed.  No physical exam could be performed with this format.  Please refer to the patient's chart for her  consent to telehealth for [facility].   Date:  05/13/2019   ID:  [name] [name], DOB Jun 10, 1925, MRN [id number]  Patient Location: Home Provider Location: Office  PCP:  Joseph Art, MD  Cardiologist:  Carlyle Dolly, MD  Electrophysiologist:  None   Evaluation Performed:  Follow-Up Visit  Chief Complaint:  Follow up visit  History of Present Illness:    [name] [name] is a 85 y.o. female seen today for follow up of the following medical problems.   1. SOB - echo 12/2016 LVEF 60-65%, no WMAs, cannot eval diastolic functionbut severe LAE, moderate MR, +PFO. - completed CXR and PFTs that were overall benign    - stable SOB unchanged. Remains active, symptoms really seem to come on with walking up stairs or long distances.      2. Afib - mild palpitations, infrequent.  - no recent bleeding on coumadin - still not interested in DOACs - she reports long acting dilt is too expensive.    3. Hyperlipidemia - labs followed by pcp - she is on statin    The patient does not have symptoms concerning for COVID-19 infection (fever, chills, cough, or new shortness of breath).    Past Medical History:   Diagnosis Date  . Abnormal EKG    low voltage (no amyloid)  . Anxiety    May play a role with some symptoms  . Aortic valve sclerosis    Echo  04/2009  EF 65%.no stenosis.... echo... September, 2010  . Arthritis   . Atrial fibrillation ([place])    DCCV prior to 04/05/2009 but reverted A.Fib, consultation w/Dr.Klein, decision to use Propafenone and   cardiovert again..cardioversion June 08, 2009.[name] on 4 days Propafenone... 100 Joules... normal sinus rhythm... Cardizem stopped.. /  return to atrial fib.[name]   /   cardionet on cardizem...09/14/2009...rates  52-190.  On Coumadin  . Back pain    February, 2013  . Carotid stenosis    Dopplers 2009 better than past; Dopplers 09/07/2009 <50% bilateral-stable  . Chest pain    cardiac catheterization.. 2009... no significant CAD.[name] / recurrent chest pain 2010.. consider; EF 65% by Echo 04/2009  . Drug therapy    coumadin  . Dyspnea   . Ejection fraction   . GERD (gastroesophageal reflux disease)    Aspirin Stopped  . H/O Spinal surgery    Successful,  2013  . Hyperlipidemia   . Hypertension   . Shortness of breath    Shortness of breath and fatigue June, 2012  . Visual changes    Prior planned to have ophthalmology evlaution   Past Surgical History:  Procedure Laterality Date  . BACK SURGERY     X 2, most recent 2009  . CATARACT EXTRACTION, BILATERAL  Current Meds  Medication Sig  . Calcium Carbonate-Vitamin D (CALCIUM 600+D) 600-400 MG-UNIT per tablet Take 1 tablet by mouth 2 (two) times daily.    [name] diltiazem (CARDIZEM) 60 MG tablet Take 60 mg by mouth 3 (three) times daily.  . furosemide (LASIX) 40 MG tablet TAKE 1 TABLET BY MOUTH EVERY DAY  . metoprolol tartrate (LOPRESSOR) 50 MG tablet TAKE 1 TABLET BY MOUTH TWICE DAILY- OFFICE VISIT REQUIRED FOR FURTHER REFILLS  . Multiple Vitamin (MULTIVITAMIN) tablet Take 1 tablet by mouth daily.    . simvastatin (ZOCOR) 20 MG tablet Take 20 mg by mouth at bedtime.   . traMADol (ULTRAM) 50 MG  tablet Take 25 mg by mouth every 12 (twelve) hours as needed.  . warfarin (COUMADIN) 1 MG tablet Take 1 tablet by mouth as directed. MANAGED BY DR. Nadara Mustard     Allergies:   Patient has no known allergies.   Social History   Tobacco Use  . Smoking status: Never Smoker  . Smokeless tobacco: Never Used  Substance Use Topics  . Alcohol use: No    Alcohol/week: 0.0 standard drinks  . Drug use: No     Family Hx: The patient's family history includes Cancer in an other family member; Coronary artery disease in an other family member.  ROS:   Please see the history of present illness.     All other systems reviewed and are negative.   Prior CV studies:   The following studies were reviewed today:  2009 Cath HOSPITAL COURSE: Left heart cardiac catheterization was undertaken this  morning revealing normal coronary arteries with normal LV function  without regional wall motion abnormalities.     01/2014 Echo [place] LVEF 55-60%, moderate MR,   03/2012 Carotid US No significant stenosis  09/2014 Carotid US 1-39% bilateral disease   01/2016 echo [place]: LVEF 55-60%, mod LAE, mod RAE, mild to mod MR  12/2016 CXR IMPRESSION: No acute abnormality noted.Lungs are hyperinflated suggesting COPD   12/2016 PFTs: normal  12/2016 echo Study Conclusions  - Left ventricle: The cavity size was normal. Wall thickness was normal. Systolic function was normal. The estimated ejection fraction was in the range of 60% to 65%. Wall motion was normal; there were no regional wall motion abnormalities. The study is not technically sufficient to allow evaluation of LV diastolic function. - Aortic valve: Mildly calcified annulus. Trileaflet; mildly calcified leaflets. - Mitral valve: Mildly to moderately calcified annulus. Mildly calcified leaflets . There was moderate regurgitation. - Left atrium: The atrium was severely dilated. - Right atrium: The atrium was  moderately dilated. - Atrial septum: There was a secundum ASD versus patent foramen ovale present with intermittent small left to right shunt noted by color Doppler. - Tricuspid valve: There was mild regurgitation. - Pulmonary arteries: PA peak pressure: 27 mm Hg (S). - Pericardium, extracardiac: There was no pericardial effusion.  Impressions:  - Normal LV wall thickness with LVEF 60-65%. Indeterminate diastolic function in the setting of atrial fibrillation. Severe left atrial enlargement and moderate right atrial enlargement. Calcified mitral annulus with moderate mitral regurgitation. Sclerotic aortic valve without stenosis. Mild tricuspid regurgitation with PASP 27 mmHg. Secundum ASD versus PFO with small degree of intermittent left to right shunt by color Doppler.  Labs/Other Tests and Data Reviewed:    EKG:  No ECG reviewed.  Recent Labs: No results found for requested labs within last 8760 hours.   Recent Lipid Panel Lab Results  Component Value Date/Time   CHOL  01/14/2008  02:30 AM    195        ATP III CLASSIFICATION:  <200     mg/dL   Desirable  200-239  mg/dL   Borderline High  >=240    mg/dL   High   TRIG 52 01/14/2008 02:30 AM   HDL 82 01/14/2008 02:30 AM   CHOLHDL 2.4 01/14/2008 02:30 AM   LDLCALC (H) 01/14/2008 02:30 AM    103        Total Cholesterol/HDL:CHD Risk Coronary Heart Disease Risk Table                     Men   Women  1/2 Average Risk   3.4   3.3    Wt Readings from Last 3 Encounters:  05/13/19 112 lb (50.8 kg)  10/20/18 114 lb 9.6 oz (52 kg)  04/07/18 115 lb (52.2 kg)     Objective:    Vital Signs:  Ht 5' (1.524 m)   Wt 112 lb (50.8 kg)   BMI 21.87 kg/m    Normal affect. Normal speech pattern and tone. Comfortable, no apparent distress.   ASSESSMENT & PLAN:    1.SOB -benign extensive workup in 2018. Her exercise capacity based on her age I think is quite reasonable. She walks up stairs, walks relatively  long distances at the store - continue to monitor, in absence of significant worsenign would not repeat testing.    2. Afib  - mild infrequent symptoms. Due to cost she prefers short acting dilt, also prefers coumadin over DOACs for this reason - continue current meds   3. Hyperlipidemia -request pcp labs, continue statin   4. HTN -bp at goal, continue meds  F/u 6 months    COVID-19 Education: The signs and symptoms of COVID-19 were discussed with the patient and how to seek care for testing (follow up with PCP or arrange E-visit).  The importance of social distancing was discussed today.  Time:   Today, I have spent 14 minutes with the patient with telehealth technology discussing the above problems.     Medication Adjustments/Labs and Tests Ordered: Current medicines are reviewed at length with the patient today.  Concerns regarding medicines are outlined above.   Tests Ordered: No orders of the defined types were placed in this encounter.   Medication Changes: No orders of the defined types were placed in this encounter.   Follow Up:  Virtual Visit in 6 month(s)  Signed, Carlyle Dolly, MD  05/13/2019 10:01 AM    [place]

## 2019-05-13 NOTE — Patient Instructions (Signed)

## 2019-05-25 DIAGNOSIS — Z23 Encounter for immunization: Secondary | ICD-10-CM | POA: Diagnosis not present

## 2019-05-25 DIAGNOSIS — I4891 Unspecified atrial fibrillation: Secondary | ICD-10-CM | POA: Diagnosis not present

## 2019-05-26 DIAGNOSIS — F419 Anxiety disorder, unspecified: Secondary | ICD-10-CM | POA: Diagnosis not present

## 2019-05-26 DIAGNOSIS — I480 Paroxysmal atrial fibrillation: Secondary | ICD-10-CM | POA: Diagnosis not present

## 2019-05-26 DIAGNOSIS — I10 Essential (primary) hypertension: Secondary | ICD-10-CM | POA: Diagnosis not present

## 2019-06-14 ENCOUNTER — Other Ambulatory Visit: Payer: Self-pay | Admitting: *Deleted

## 2019-06-14 MED ORDER — FUROSEMIDE 40 MG PO TABS: 40.0000 mg | ORAL_TABLET | Freq: Every day | ORAL | 2 refills | Status: DC

## 2019-06-25 DIAGNOSIS — I482 Chronic atrial fibrillation, unspecified: Secondary | ICD-10-CM | POA: Diagnosis not present

## 2019-07-02 DIAGNOSIS — H9312 Tinnitus, left ear: Secondary | ICD-10-CM | POA: Diagnosis not present

## 2019-07-21 DIAGNOSIS — H903 Sensorineural hearing loss, bilateral: Secondary | ICD-10-CM | POA: Diagnosis not present

## 2019-07-27 DIAGNOSIS — I482 Chronic atrial fibrillation, unspecified: Secondary | ICD-10-CM | POA: Diagnosis not present

## 2019-07-29 DIAGNOSIS — I482 Chronic atrial fibrillation, unspecified: Secondary | ICD-10-CM | POA: Diagnosis not present

## 2019-07-29 DIAGNOSIS — I10 Essential (primary) hypertension: Secondary | ICD-10-CM | POA: Diagnosis not present

## 2019-07-29 DIAGNOSIS — Z6822 Body mass index (BMI) 22.0-22.9, adult: Secondary | ICD-10-CM | POA: Diagnosis not present

## 2019-07-29 DIAGNOSIS — Z0000 Encounter for general adult medical examination without abnormal findings: Secondary | ICD-10-CM | POA: Diagnosis not present

## 2019-08-30 DIAGNOSIS — I482 Chronic atrial fibrillation, unspecified: Secondary | ICD-10-CM | POA: Diagnosis not present

## 2019-10-11 DIAGNOSIS — Z6822 Body mass index (BMI) 22.0-22.9, adult: Secondary | ICD-10-CM | POA: Diagnosis not present

## 2019-10-11 DIAGNOSIS — I4891 Unspecified atrial fibrillation: Secondary | ICD-10-CM | POA: Diagnosis not present

## 2019-10-11 DIAGNOSIS — R55 Syncope and collapse: Secondary | ICD-10-CM | POA: Diagnosis not present

## 2019-11-01 ENCOUNTER — Ambulatory Visit: Payer: Medicare Other | Attending: Internal Medicine

## 2019-11-01 DIAGNOSIS — Z23 Encounter for immunization: Secondary | ICD-10-CM | POA: Insufficient documentation

## 2019-11-01 NOTE — Progress Notes (Signed)
   Covid-19 Vaccination Clinic  Name:  [name] [name]    MRN: [id number] DOB: October 25, 1924  11/01/2019  [name] [name] was observed post Covid-19 immunization for 15 minutes without incident. She was provided with Vaccine Information Sheet and instruction to access the V-Safe system.   [name] [name] was instructed to call 911 with any severe reactions post vaccine: [name] Difficulty breathing  . Swelling of face and throat  . A fast heartbeat  . A bad rash all over body  . Dizziness and weakness   Immunizations Administered    Name Date Dose VIS Date Route   Pfizer COVID-19 Vaccine 11/01/2019  2:53 PM 0.3 mL 08/06/2019 Intramuscular   Manufacturer: [place]   Lot: [id number]   [place]: [id number]

## 2019-11-24 DIAGNOSIS — I4891 Unspecified atrial fibrillation: Secondary | ICD-10-CM | POA: Diagnosis not present

## 2019-11-25 ENCOUNTER — Encounter: Payer: Self-pay | Admitting: Cardiology

## 2019-11-25 ENCOUNTER — Other Ambulatory Visit: Payer: Self-pay

## 2019-11-25 ENCOUNTER — Ambulatory Visit (INDEPENDENT_AMBULATORY_CARE_PROVIDER_SITE_OTHER): Payer: Medicare Other | Admitting: Cardiology

## 2019-11-25 VITALS — BP 90/62 | HR 76 | Ht 62.5 in | Wt 114.0 lb

## 2019-11-25 DIAGNOSIS — I10 Essential (primary) hypertension: Secondary | ICD-10-CM | POA: Diagnosis not present

## 2019-11-25 DIAGNOSIS — I4891 Unspecified atrial fibrillation: Secondary | ICD-10-CM

## 2019-11-25 DIAGNOSIS — E782 Mixed hyperlipidemia: Secondary | ICD-10-CM

## 2019-11-25 DIAGNOSIS — R0602 Shortness of breath: Secondary | ICD-10-CM | POA: Diagnosis not present

## 2019-11-25 NOTE — Progress Notes (Signed)
Clinical Summary [name] [name] is a 86 y.o.female seen today for follow up of the following medical problems.   1. SOB - echo 12/2016 LVEF 60-65%, no WMAs, cannot eval diastolic functionbut severe LAE, moderate MR, +PFO. - completed CXR and PFTs that were overall benign   - occasional SOB, infrequent - remains active at home doing yard work     2. Afib - still not interested in DOACs - she reports long acting dilt is too expensive.   - no recent palpitations - compliant withmeds  - pcp lowered diltiazem to 30mg  bid, metoprlol 25mg  bid.    3. Hyperlipidemia -labs followed by pcp - compliant with statin   Past Medical History:  Diagnosis Date  . Abnormal EKG    low voltage (no amyloid)  . Anxiety    May play a role with some symptoms  . Aortic valve sclerosis    Echo  04/2009  EF 65%.no stenosis.... echo... September, 2010  . Arthritis   . Atrial fibrillation ([place])    DCCV prior to 04/05/2009 but reverted A.Fib, consultation w/Dr.Klein, decision to use Propafenone and   cardiovert again..cardioversion June 08, 2009.[name] on 4 days Propafenone... 100 Joules... normal sinus rhythm... Cardizem stopped.. /  return to atrial fib.[name]   /   cardionet on cardizem...09/14/2009...rates  52-190.  On Coumadin  . Back pain    February, 2013  . Carotid stenosis    Dopplers 2009 better than past; Dopplers 09/07/2009 <50% bilateral-stable  . Chest pain    cardiac catheterization.. 2009... no significant CAD.[name] / recurrent chest pain 2010.. consider; EF 65% by Echo 04/2009  . Drug therapy    coumadin  . Dyspnea   . Ejection fraction   . GERD (gastroesophageal reflux disease)    Aspirin Stopped  . H/O Spinal surgery    Successful,  2013  . Hyperlipidemia   . Hypertension   . Shortness of breath    Shortness of breath and fatigue June, 2012  . Visual changes    Prior planned to have ophthalmology evlaution     No Known Allergies   Current Outpatient Medications    Medication Sig Dispense Refill  . Calcium Carbonate-Vitamin D (CALCIUM 600+D) 600-400 MG-UNIT per tablet Take 1 tablet by mouth 2 (two) times daily.      [name] diltiazem (CARDIZEM) 60 MG tablet Take 60 mg by mouth 3 (three) times daily.    . furosemide (LASIX) 40 MG tablet Take 1 tablet (40 mg total) by mouth daily. 90 tablet 2  . metoprolol tartrate (LOPRESSOR) 50 MG tablet TAKE 1 TABLET BY MOUTH TWICE DAILY- OFFICE VISIT REQUIRED FOR FURTHER REFILLS 180 tablet 0  . Multiple Vitamin (MULTIVITAMIN) tablet Take 1 tablet by mouth daily.      . simvastatin (ZOCOR) 20 MG tablet Take 20 mg by mouth at bedtime.     . traMADol (ULTRAM) 50 MG tablet Take 25 mg by mouth every 12 (twelve) hours as needed.    . warfarin (COUMADIN) 1 MG tablet Take 1 tablet by mouth as directed. MANAGED BY DR. Nadara Mustard  0   No current facility-administered medications for this visit.     Past Surgical History:  Procedure Laterality Date  . BACK SURGERY     X 2, most recent 2009  . CATARACT EXTRACTION, BILATERAL       No Known Allergies    Family History  Problem Relation Age of Onset  . Cancer Other   . Coronary artery disease  Other      Social History Ms. Gerke reports that she has never smoked. She has never used smokeless tobacco. Ms. Brison reports no history of alcohol use.   Review of Systems CONSTITUTIONAL: No weight loss, fever, chills, weakness or fatigue.  HEENT: Eyes: No visual loss, blurred vision, double vision or yellow sclerae.No hearing loss, sneezing, congestion, runny nose or sore throat.  SKIN: No rash or itching.  CARDIOVASCULAR: per hpi RESPIRATORY: No shortness of breath, cough or sputum.  GASTROINTESTINAL: No anorexia, nausea, vomiting or diarrhea. No abdominal pain or blood.  GENITOURINARY: No burning on urination, no polyuria NEUROLOGICAL: No headache, dizziness, syncope, paralysis, ataxia, numbness or tingling in the extremities. No change in bowel or bladder control.   MUSCULOSKELETAL: No muscle, back pain, joint pain or stiffness.  LYMPHATICS: No enlarged nodes. No history of splenectomy.  PSYCHIATRIC: No history of depression or anxiety.  ENDOCRINOLOGIC: No reports of sweating, cold or heat intolerance. No polyuria or polydipsia.  [name]   Physical Examination Today's Vitals   11/25/19 1535  BP: 90/62  Pulse: 76  SpO2: 95%  Weight: 114 lb (51.7 kg)  Height: 5' 2.5" (1.588 m)   Body mass index is 20.52 kg/m.  Gen: resting comfortably, no acute distress HEENT: no scleral icterus, pupils equal round and reactive, no palptable cervical adenopathy,  CV: irreg, 3/6 systolic murmur apex, no jvd Resp: Clear to auscultation bilaterally GI: abdomen is soft, non-tender, non-distended, normal bowel sounds, no hepatosplenomegaly MSK: extremities are warm, no edema.  Skin: warm, no rash Neuro:  no focal deficits Psych: appropriate affect   Diagnostic Studies     Assessment and Plan  1.SOB -benign extensive workup in 2018. Her exercise capacity based on her age I think is quite reasonable.  - no recent significant symptoms, continue to monitor.    2. Afib  -  Due to cost she prefers short acting dilt, also prefers coumadin over DOACs for this reason - no recent symptoms, continue current meds - pcp had lowered dilt and lopressor I assume due to low bp's, rates remain controlled, ekg today shows rate controlled afib   3. Hyperlipidemia -she will continue statin, request labs from pcp   4. HTN -manual recheck 110/65, continue current meds   F/u 6 months   Arnoldo Lenis, M.D.

## 2019-11-25 NOTE — Patient Instructions (Signed)

## 2019-11-26 ENCOUNTER — Encounter: Payer: Self-pay | Admitting: *Deleted

## 2019-12-07 ENCOUNTER — Ambulatory Visit: Payer: Medicare Other | Attending: Internal Medicine

## 2019-12-07 DIAGNOSIS — Z23 Encounter for immunization: Secondary | ICD-10-CM

## 2019-12-07 NOTE — Progress Notes (Signed)
   Covid-19 Vaccination Clinic  Name:  [name] [name]    MRN: [id number] DOB: 05-07-1925  12/07/2019  [name] [name] was observed post Covid-19 immunization for 15 minutes without incident. She was provided with Vaccine Information Sheet and instruction to access the V-Safe system.   [name] [name] was instructed to call 911 with any severe reactions post vaccine: [name] Difficulty breathing  . Swelling of face and throat  . A fast heartbeat  . A bad rash all over body  . Dizziness and weakness   Immunizations Administered    Name Date Dose VIS Date Route   Pfizer COVID-19 Vaccine 12/07/2019  1:29 PM 0.3 mL 08/06/2019 Intramuscular   Manufacturer: [place]   Lot: [id number]   [place]: [id number]

## 2019-12-22 DIAGNOSIS — I4891 Unspecified atrial fibrillation: Secondary | ICD-10-CM | POA: Diagnosis not present

## 2020-01-10 DIAGNOSIS — H43813 Vitreous degeneration, bilateral: Secondary | ICD-10-CM | POA: Diagnosis not present

## 2020-01-11 DIAGNOSIS — I482 Chronic atrial fibrillation, unspecified: Secondary | ICD-10-CM | POA: Diagnosis not present

## 2020-01-11 DIAGNOSIS — I10 Essential (primary) hypertension: Secondary | ICD-10-CM | POA: Diagnosis not present

## 2020-01-11 DIAGNOSIS — R531 Weakness: Secondary | ICD-10-CM | POA: Diagnosis not present

## 2020-01-11 DIAGNOSIS — Z6822 Body mass index (BMI) 22.0-22.9, adult: Secondary | ICD-10-CM | POA: Diagnosis not present

## 2020-03-05 ENCOUNTER — Other Ambulatory Visit: Payer: Self-pay | Admitting: Cardiology

## 2020-03-08 DIAGNOSIS — I4891 Unspecified atrial fibrillation: Secondary | ICD-10-CM | POA: Diagnosis not present

## 2020-04-05 DIAGNOSIS — I4891 Unspecified atrial fibrillation: Secondary | ICD-10-CM | POA: Diagnosis not present

## 2020-04-12 DIAGNOSIS — I4891 Unspecified atrial fibrillation: Secondary | ICD-10-CM | POA: Diagnosis not present

## 2020-04-20 DIAGNOSIS — I482 Chronic atrial fibrillation, unspecified: Secondary | ICD-10-CM | POA: Diagnosis not present

## 2020-04-20 DIAGNOSIS — W57XXXA Bitten or stung by nonvenomous insect and other nonvenomous arthropods, initial encounter: Secondary | ICD-10-CM | POA: Diagnosis not present

## 2020-04-20 DIAGNOSIS — R21 Rash and other nonspecific skin eruption: Secondary | ICD-10-CM | POA: Diagnosis not present

## 2020-04-20 DIAGNOSIS — Z23 Encounter for immunization: Secondary | ICD-10-CM | POA: Diagnosis not present

## 2020-04-20 DIAGNOSIS — Z6822 Body mass index (BMI) 22.0-22.9, adult: Secondary | ICD-10-CM | POA: Diagnosis not present

## 2020-04-29 DIAGNOSIS — Z6821 Body mass index (BMI) 21.0-21.9, adult: Secondary | ICD-10-CM | POA: Diagnosis not present

## 2020-04-29 DIAGNOSIS — R21 Rash and other nonspecific skin eruption: Secondary | ICD-10-CM | POA: Diagnosis not present

## 2020-05-03 DIAGNOSIS — Z6821 Body mass index (BMI) 21.0-21.9, adult: Secondary | ICD-10-CM | POA: Diagnosis not present

## 2020-05-03 DIAGNOSIS — R21 Rash and other nonspecific skin eruption: Secondary | ICD-10-CM | POA: Diagnosis not present

## 2020-05-16 DIAGNOSIS — I4891 Unspecified atrial fibrillation: Secondary | ICD-10-CM | POA: Diagnosis not present

## 2020-05-25 DIAGNOSIS — D649 Anemia, unspecified: Secondary | ICD-10-CM | POA: Diagnosis not present

## 2020-05-25 DIAGNOSIS — I10 Essential (primary) hypertension: Secondary | ICD-10-CM | POA: Diagnosis not present

## 2020-05-25 DIAGNOSIS — R21 Rash and other nonspecific skin eruption: Secondary | ICD-10-CM | POA: Diagnosis not present

## 2020-05-25 DIAGNOSIS — I482 Chronic atrial fibrillation, unspecified: Secondary | ICD-10-CM | POA: Diagnosis not present

## 2020-05-25 DIAGNOSIS — R531 Weakness: Secondary | ICD-10-CM | POA: Diagnosis not present

## 2020-05-25 DIAGNOSIS — Z6821 Body mass index (BMI) 21.0-21.9, adult: Secondary | ICD-10-CM | POA: Diagnosis not present

## 2020-05-26 ENCOUNTER — Telehealth: Payer: Self-pay | Admitting: Cardiology

## 2020-05-26 ENCOUNTER — Encounter: Payer: Self-pay | Admitting: Cardiology

## 2020-05-26 ENCOUNTER — Ambulatory Visit (INDEPENDENT_AMBULATORY_CARE_PROVIDER_SITE_OTHER): Payer: Medicare Other | Admitting: Cardiology

## 2020-05-26 ENCOUNTER — Encounter: Payer: Self-pay | Admitting: *Deleted

## 2020-05-26 VITALS — BP 124/84 | HR 77 | Ht 60.0 in | Wt 108.0 lb

## 2020-05-26 DIAGNOSIS — I4891 Unspecified atrial fibrillation: Secondary | ICD-10-CM | POA: Diagnosis not present

## 2020-05-26 DIAGNOSIS — R0602 Shortness of breath: Secondary | ICD-10-CM

## 2020-05-26 DIAGNOSIS — I10 Essential (primary) hypertension: Secondary | ICD-10-CM | POA: Diagnosis not present

## 2020-05-26 DIAGNOSIS — E782 Mixed hyperlipidemia: Secondary | ICD-10-CM | POA: Diagnosis not present

## 2020-05-26 MED ORDER — FUROSEMIDE 20 MG PO TABS: 20.0000 mg | ORAL_TABLET | Freq: Every day | ORAL | 1 refills | Status: DC

## 2020-05-26 NOTE — Progress Notes (Signed)
Clinical Summary [name] [name] is a 86 y.o.female seen today for follow up of the following medical problems.   1. SOB - echo 12/2016 LVEF 60-65%, no WMAs, cannot eval diastolic functionbut severe LAE, moderate MR, +PFO. - completed CXR and PFTs that were overall benign   - stable SOB unchanged. Stays active.   2. Afib - still not interested in DOACs - she reports long acting dilt is too expensive.   - pcp lowered diltiazem to 30mg  bid, metoprlol 25mg  bid. Now taking dilt just at night.  - can have some orthostatic symptoms. Some leg cramps - no recent palpitaitons, sometimes at night. Resolves with repositioning.    3. Hyperlipidemia -compliant with meds - labs followed by pcp   Has covid vaccine x 2 pfizer,   Past Medical History:  Diagnosis Date  . Abnormal EKG    low voltage (no amyloid)  . Anxiety    May play a role with some symptoms  . Aortic valve sclerosis    Echo  04/2009  EF 65%.no stenosis.... echo... September, 2010  . Arthritis   . Atrial fibrillation ([place])    DCCV prior to 04/05/2009 but reverted A.Fib, consultation w/Dr.Klein, decision to use Propafenone and   cardiovert again..cardioversion June 08, 2009.[name] on 4 days Propafenone... 100 Joules... normal sinus rhythm... Cardizem stopped.. /  return to atrial fib.[name]   /   cardionet on cardizem...09/14/2009...rates  52-190.  On Coumadin  . Back pain    February, 2013  . Carotid stenosis    Dopplers 2009 better than past; Dopplers 09/07/2009 <50% bilateral-stable  . Chest pain    cardiac catheterization.. 2009... no significant CAD.[name] / recurrent chest pain 2010.. consider; EF 65% by Echo 04/2009  . Drug therapy    coumadin  . Dyspnea   . Ejection fraction   . GERD (gastroesophageal reflux disease)    Aspirin Stopped  . H/O Spinal surgery    Successful,  2013  . Hyperlipidemia   . Hypertension   . Shortness of breath    Shortness of breath and fatigue June, 2012  . Visual changes     Prior planned to have ophthalmology evlaution     No Known Allergies   Current Outpatient Medications  Medication Sig Dispense Refill  . Calcium Carbonate-Vitamin D (CALCIUM 600+D) 600-400 MG-UNIT per tablet Take 1 tablet by mouth 2 (two) times daily.      [name] diltiazem (CARDIZEM) 60 MG tablet Take 30 mg by mouth 2 (two) times daily.     . furosemide (LASIX) 40 MG tablet TAKE 1 TABLET(40 MG) BY MOUTH DAILY 90 tablet 2  . metoprolol tartrate (LOPRESSOR) 50 MG tablet Take 25 mg by mouth 2 (two) times daily.    . Multiple Vitamin (MULTIVITAMIN) tablet Take 1 tablet by mouth daily.      . simvastatin (ZOCOR) 20 MG tablet Take 20 mg by mouth at bedtime.     . traMADol (ULTRAM) 50 MG tablet Take 25 mg by mouth every 12 (twelve) hours as needed.    . warfarin (COUMADIN) 1 MG tablet Take 1 tablet by mouth as directed. MANAGED BY DR. Nadara Mustard  0   No current facility-administered medications for this visit.     Past Surgical History:  Procedure Laterality Date  . BACK SURGERY     X 2, most recent 2009  . CATARACT EXTRACTION, BILATERAL       No Known Allergies    Family History  Problem Relation Age of  Onset  . Cancer Other   . Coronary artery disease Other      Social History [name] [name] reports that she has never smoked. She has never used smokeless tobacco. [name] [name] reports no history of alcohol use.   Review of Systems CONSTITUTIONAL: No weight loss, fever, chills, weakness or fatigue.  HEENT: Eyes: No visual loss, blurred vision, double vision or yellow sclerae.No hearing loss, sneezing, congestion, runny nose or sore throat.  SKIN: No rash or itching.  CARDIOVASCULAR: per hpi RESPIRATORY: No shortness of breath, cough or sputum.  GASTROINTESTINAL: No anorexia, nausea, vomiting or diarrhea. No abdominal pain or blood.  GENITOURINARY: No burning on urination, no polyuria NEUROLOGICAL: No headache, dizziness, syncope, paralysis, ataxia, numbness or tingling in the  extremities. No change in bowel or bladder control.  MUSCULOSKELETAL: No muscle, back pain, joint pain or stiffness.  LYMPHATICS: No enlarged nodes. No history of splenectomy.  PSYCHIATRIC: No history of depression or anxiety.  ENDOCRINOLOGIC: No reports of sweating, cold or heat intolerance. No polyuria or polydipsia.  [name]   Physical Examination Today's Vitals   05/26/20 1031  BP: 124/84  Pulse: 77  SpO2: 98%  Weight: 108 lb (49 kg)  Height: 5' (1.524 m)   Body mass index is 21.09 kg/m.  Gen: resting comfortably, no acute distress HEENT: no scleral icterus, pupils equal round and reactive, no palptable cervical adenopathy,  CV: irreg, no m/r/g, no jvd Resp: Clear to auscultation bilaterally GI: abdomen is soft, non-tender, non-distended, normal bowel sounds, no hepatosplenomegaly MSK: extremities are warm, no edema.  Skin: warm, no rash Neuro:  no focal deficits Psych: appropriate affect   Diagnostic Studies     Assessment and Plan  1.SOB -benign extensive workup in 2018. Her exercise capacity based on her age I think is quitereasonable.  - overall stable, continue to monitor   2. Afib  - Due to cost she prefers short acting dilt, also prefers coumadin over DOACs for this reason - over time pcp has weaned av nodal agents due to soft bp's, afib remains controlled. Continue current meds  3. Hyperlipidemia -request pcp labs, continue statin  4. HTN -at goal, cotninue current meds   F/u 6 months     Arnoldo Lenis, M.D

## 2020-05-26 NOTE — Telephone Encounter (Signed)
Pt and daughter made aware that lasix 20 mg was sent to [facility] and could cut 40 mg tablet in half

## 2020-05-26 NOTE — Addendum Note (Signed)
Addended by: Julian Hy T on: 05/26/2020 03:24 PM   Modules accepted: Orders

## 2020-05-26 NOTE — Patient Instructions (Addendum)
Your physician wants you to follow-up in: [place] will receive a reminder letter in the mail two months in advance. If you don't receive a letter, please call our office to schedule the follow-up appointment.  Your physician has recommended you make the following change in your medication:   DECREASE LASIX 20 MG DAILY    Thank you for choosing [facility]!!

## 2020-05-26 NOTE — Telephone Encounter (Signed)
Called pt. No answer. Left msg to call back.  

## 2020-05-26 NOTE — Telephone Encounter (Signed)
Patient called after seeing Dr. Harl Bowie today. She has a question about her medication .

## 2020-06-01 DIAGNOSIS — D485 Neoplasm of uncertain behavior of skin: Secondary | ICD-10-CM | POA: Diagnosis not present

## 2020-06-01 DIAGNOSIS — L299 Pruritus, unspecified: Secondary | ICD-10-CM | POA: Diagnosis not present

## 2020-06-01 DIAGNOSIS — L280 Lichen simplex chronicus: Secondary | ICD-10-CM | POA: Diagnosis not present

## 2020-06-01 DIAGNOSIS — T7840XA Allergy, unspecified, initial encounter: Secondary | ICD-10-CM | POA: Diagnosis not present

## 2020-06-05 DIAGNOSIS — I4891 Unspecified atrial fibrillation: Secondary | ICD-10-CM | POA: Diagnosis not present

## 2020-06-12 DIAGNOSIS — I4891 Unspecified atrial fibrillation: Secondary | ICD-10-CM | POA: Diagnosis not present

## 2020-06-15 DIAGNOSIS — B86 Scabies: Secondary | ICD-10-CM | POA: Diagnosis not present

## 2020-06-26 DIAGNOSIS — I4891 Unspecified atrial fibrillation: Secondary | ICD-10-CM | POA: Diagnosis not present

## 2020-07-03 DIAGNOSIS — I4891 Unspecified atrial fibrillation: Secondary | ICD-10-CM | POA: Diagnosis not present

## 2020-07-06 DIAGNOSIS — B86 Scabies: Secondary | ICD-10-CM | POA: Diagnosis not present

## 2020-07-11 DIAGNOSIS — I4891 Unspecified atrial fibrillation: Secondary | ICD-10-CM | POA: Diagnosis not present

## 2020-07-14 DIAGNOSIS — R079 Chest pain, unspecified: Secondary | ICD-10-CM | POA: Diagnosis not present

## 2020-07-14 DIAGNOSIS — E785 Hyperlipidemia, unspecified: Secondary | ICD-10-CM | POA: Diagnosis present

## 2020-07-14 DIAGNOSIS — I4891 Unspecified atrial fibrillation: Secondary | ICD-10-CM | POA: Diagnosis not present

## 2020-07-14 DIAGNOSIS — R0602 Shortness of breath: Secondary | ICD-10-CM | POA: Diagnosis not present

## 2020-07-14 DIAGNOSIS — R0902 Hypoxemia: Secondary | ICD-10-CM | POA: Diagnosis not present

## 2020-07-14 DIAGNOSIS — Z7901 Long term (current) use of anticoagulants: Secondary | ICD-10-CM | POA: Diagnosis not present

## 2020-07-14 DIAGNOSIS — H9190 Unspecified hearing loss, unspecified ear: Secondary | ICD-10-CM | POA: Diagnosis present

## 2020-07-14 DIAGNOSIS — I10 Essential (primary) hypertension: Secondary | ICD-10-CM | POA: Diagnosis present

## 2020-07-14 DIAGNOSIS — Z20822 Contact with and (suspected) exposure to covid-19: Secondary | ICD-10-CM | POA: Diagnosis not present

## 2020-07-14 DIAGNOSIS — I480 Paroxysmal atrial fibrillation: Secondary | ICD-10-CM | POA: Diagnosis not present

## 2020-07-14 DIAGNOSIS — I517 Cardiomegaly: Secondary | ICD-10-CM | POA: Diagnosis not present

## 2020-07-14 DIAGNOSIS — D649 Anemia, unspecified: Secondary | ICD-10-CM | POA: Diagnosis present

## 2020-07-14 DIAGNOSIS — Z6822 Body mass index (BMI) 22.0-22.9, adult: Secondary | ICD-10-CM | POA: Diagnosis not present

## 2020-07-14 DIAGNOSIS — J9811 Atelectasis: Secondary | ICD-10-CM | POA: Diagnosis not present

## 2020-07-14 DIAGNOSIS — N179 Acute kidney failure, unspecified: Secondary | ICD-10-CM | POA: Diagnosis present

## 2020-07-14 DIAGNOSIS — J90 Pleural effusion, not elsewhere classified: Secondary | ICD-10-CM | POA: Diagnosis not present

## 2020-07-17 ENCOUNTER — Telehealth: Payer: Self-pay | Admitting: Cardiology

## 2020-07-17 NOTE — Telephone Encounter (Signed)
noted 

## 2020-07-17 NOTE — Telephone Encounter (Signed)
  Daughter called to let Dr Harl Bowie know that patient is admitted to [facility] due to high heart rate and low oxygen level. Note from answering service was dated 07/14/20

## 2020-07-24 DIAGNOSIS — I4891 Unspecified atrial fibrillation: Secondary | ICD-10-CM | POA: Diagnosis not present

## 2020-07-24 DIAGNOSIS — Z6821 Body mass index (BMI) 21.0-21.9, adult: Secondary | ICD-10-CM | POA: Diagnosis not present

## 2020-07-24 DIAGNOSIS — Z23 Encounter for immunization: Secondary | ICD-10-CM | POA: Diagnosis not present

## 2020-07-24 DIAGNOSIS — R21 Rash and other nonspecific skin eruption: Secondary | ICD-10-CM | POA: Diagnosis not present

## 2020-07-24 DIAGNOSIS — T45511A Poisoning by anticoagulants, accidental (unintentional), initial encounter: Secondary | ICD-10-CM | POA: Diagnosis not present

## 2020-07-24 DIAGNOSIS — D696 Thrombocytopenia, unspecified: Secondary | ICD-10-CM | POA: Diagnosis not present

## 2020-07-25 ENCOUNTER — Encounter: Payer: Self-pay | Admitting: Cardiology

## 2020-07-25 ENCOUNTER — Ambulatory Visit (INDEPENDENT_AMBULATORY_CARE_PROVIDER_SITE_OTHER): Payer: Medicare Other | Admitting: Cardiology

## 2020-07-25 VITALS — BP 138/80 | HR 68 | Ht 60.0 in | Wt 112.0 lb

## 2020-07-25 DIAGNOSIS — I4891 Unspecified atrial fibrillation: Secondary | ICD-10-CM

## 2020-07-25 MED ORDER — METOPROLOL TARTRATE 50 MG PO TABS
50.0000 mg | ORAL_TABLET | Freq: Two times a day (BID) | ORAL | 3 refills | Status: DC
Start: 2020-07-25 — End: 2020-11-02

## 2020-07-25 NOTE — Progress Notes (Signed)
Clinical Summary [name] [name] is a 86 y.o.female seen today for follow up of the following medical problems.This is a focused visit on recent admission with afib with RVR.     1. Afib - still not interested in DOACs - she reports long acting dilt is too expensive.   - pcp lowered diltiazem to 30mg  bid, metoprlol 25mg  bid.Now taking dilt just at night.  - can have some orthostatic symptoms. Some leg cramps - no recent palpitaitons, sometimes at night. Resolves with repositioning.    06/2020 admitted to [facility] with afib with RVR - was on dilt gtt transiently - discharged on oral lopressor 37.5mg  bid, dilt 30mg  bid - denies any recent palpitations.    Has covid vaccine x 2 pfizer,    Past Medical History:  Diagnosis Date  . Abnormal EKG    low voltage (no amyloid)  . Anxiety    May play a role with some symptoms  . Aortic valve sclerosis    Echo  04/2009  EF 65%.no stenosis.... echo... September, 2010  . Arthritis   . Atrial fibrillation ([place])    DCCV prior to 04/05/2009 but reverted A.Fib, consultation w/Dr.Klein, decision to use Propafenone and   cardiovert again..cardioversion June 08, 2009.[name] on 4 days Propafenone... 100 Joules... normal sinus rhythm... Cardizem stopped.. /  return to atrial fib.[name]   /   cardionet on cardizem...09/14/2009...rates  52-190.  On Coumadin  . Back pain    February, 2013  . Carotid stenosis    Dopplers 2009 better than past; Dopplers 09/07/2009 <50% bilateral-stable  . Chest pain    cardiac catheterization.. 2009... no significant CAD.[name] / recurrent chest pain 2010.. consider; EF 65% by Echo 04/2009  . Drug therapy    coumadin  . Dyspnea   . Ejection fraction   . GERD (gastroesophageal reflux disease)    Aspirin Stopped  . H/O Spinal surgery    Successful,  2013  . Hyperlipidemia   . Hypertension   . Shortness of breath    Shortness of breath and fatigue June, 2012  . Visual changes    Prior planned to have ophthalmology  evlaution     No Known Allergies   Current Outpatient Medications  Medication Sig Dispense Refill  . Calcium Carbonate-Vitamin D (CALCIUM 600+D) 600-400 MG-UNIT per tablet Take 1 tablet by mouth 2 (two) times daily.      [name] diltiazem (CARDIZEM) 60 MG tablet Take 30 mg by mouth daily.    . furosemide (LASIX) 20 MG tablet Take 1 tablet (20 mg total) by mouth daily. 90 tablet 1  . metoprolol tartrate (LOPRESSOR) 50 MG tablet Take 25 mg by mouth 2 (two) times daily.    . Multiple Vitamin (MULTIVITAMIN) tablet Take 1 tablet by mouth daily.      . simvastatin (ZOCOR) 20 MG tablet Take 20 mg by mouth at bedtime.     . traMADol (ULTRAM) 50 MG tablet Take 25 mg by mouth every 12 (twelve) hours as needed.    . warfarin (COUMADIN) 1 MG tablet Take 1 tablet by mouth as directed. MANAGED BY DR. Nadara Mustard  0   No current facility-administered medications for this visit.     Past Surgical History:  Procedure Laterality Date  . BACK SURGERY     X 2, most recent 2009  . CATARACT EXTRACTION, BILATERAL       No Known Allergies    Family History  Problem Relation Age of Onset  . Cancer Other   .  Coronary artery disease Other      Social History [name] [name] reports that she has never smoked. She has never used smokeless tobacco. [name] [name] reports no history of alcohol use.   Review of Systems CONSTITUTIONAL: No weight loss, fever, chills, weakness or fatigue.  HEENT: Eyes: No visual loss, blurred vision, double vision or yellow sclerae.No hearing loss, sneezing, congestion, runny nose or sore throat.  SKIN: No rash or itching.  CARDIOVASCULAR: per hpi RESPIRATORY: No shortness of breath, cough or sputum.  GASTROINTESTINAL: No anorexia, nausea, vomiting or diarrhea. No abdominal pain or blood.  GENITOURINARY: No burning on urination, no polyuria NEUROLOGICAL: No headache, dizziness, syncope, paralysis, ataxia, numbness or tingling in the extremities. No change in bowel or bladder  control.  MUSCULOSKELETAL: No muscle, back pain, joint pain or stiffness.  LYMPHATICS: No enlarged nodes. No history of splenectomy.  PSYCHIATRIC: No history of depression or anxiety.  ENDOCRINOLOGIC: No reports of sweating, cold or heat intolerance. No polyuria or polydipsia.  [name]   Physical Examination Today's Vitals   07/25/20 1538  BP: 138/80  Pulse: 68  SpO2: 98%  Weight: 112 lb (50.8 kg)  Height: 5' (1.524 m)   Body mass index is 21.87 kg/m.  Gen: resting comfortably, no acute distress HEENT: no scleral icterus, pupils equal round and reactive, no palptable cervical adenopathy,  CV: irreg, 3/6 systolic murmur apex Resp: Clear to auscultation bilaterally GI: abdomen is soft, non-tender, non-distended, normal bowel sounds, no hepatosplenomegaly MSK: extremities are warm, no edema.  Skin: warm, no rash Neuro:  no focal deficits Psych: appropriate affect     Assessment and Plan    1. Afib  -Due to cost she prefers short acting dilt, also prefers coumadin over DOACs for this reason -over time pcp has weaned av nodal agents due to soft bp's  - recent admission with afib with RVR - dynamap appears inaccurate for her, HR by EKG 102 in afib. Will need EKGs for heart rates going forward. Increase lopressor to 50mg  bid. Bp's are fine today, will see how she tolerates.        Arnoldo Lenis, M.D.

## 2020-07-25 NOTE — Patient Instructions (Signed)
Your physician recommends that you schedule a follow-up appointment in: [place]  Your physician has recommended you make the following change in your medication:   INCREASE LOPRESSOR 50 MG TWICE DAILY   Thank you for choosing [facility]!!

## 2020-07-26 DIAGNOSIS — I4891 Unspecified atrial fibrillation: Secondary | ICD-10-CM | POA: Diagnosis not present

## 2020-07-27 DIAGNOSIS — L308 Other specified dermatitis: Secondary | ICD-10-CM | POA: Diagnosis not present

## 2020-07-28 DIAGNOSIS — I4891 Unspecified atrial fibrillation: Secondary | ICD-10-CM | POA: Diagnosis not present

## 2020-07-31 DIAGNOSIS — I4891 Unspecified atrial fibrillation: Secondary | ICD-10-CM | POA: Diagnosis not present

## 2020-08-04 ENCOUNTER — Ambulatory Visit: Payer: Medicare Other | Admitting: Cardiology

## 2020-08-08 ENCOUNTER — Ambulatory Visit (INDEPENDENT_AMBULATORY_CARE_PROVIDER_SITE_OTHER): Payer: Medicare Other | Admitting: *Deleted

## 2020-08-08 DIAGNOSIS — I10 Essential (primary) hypertension: Secondary | ICD-10-CM | POA: Diagnosis not present

## 2020-08-08 DIAGNOSIS — Z23 Encounter for immunization: Secondary | ICD-10-CM | POA: Diagnosis not present

## 2020-08-08 DIAGNOSIS — I4891 Unspecified atrial fibrillation: Secondary | ICD-10-CM | POA: Diagnosis not present

## 2020-08-08 DIAGNOSIS — Z6820 Body mass index (BMI) 20.0-20.9, adult: Secondary | ICD-10-CM | POA: Diagnosis not present

## 2020-08-08 DIAGNOSIS — R270 Ataxia, unspecified: Secondary | ICD-10-CM | POA: Diagnosis not present

## 2020-08-08 DIAGNOSIS — I482 Chronic atrial fibrillation, unspecified: Secondary | ICD-10-CM | POA: Diagnosis not present

## 2020-08-08 NOTE — Progress Notes (Signed)
Would be odd to get that type of rash just from increasing the dose slightly on the lopressor. Was there anything else different over this time frame, any new meds, soaps, shampoos, lotions, washing detergents etc  Bettey Costa MD

## 2020-08-08 NOTE — Progress Notes (Signed)
Pt has not been taking Lopressor 50 mg bid (did not read directions from [facility] correctly) and has been taking Lopressor 25 mg bid until this past Sunday when she realized she was supposed to increase - took 50 mg bid Sunday and Monday and says this morning when she woke up she had blisters and rash all over her stomach/arms - went to Dr Nadara Mustard who gave her Benadryl and topical ointment - pt hasn't taken Lopressor today thinks she had an allergic reaction - pt denies any complaints at this time - BP by [facility] check 122/70 - EKG done and will forward to provider

## 2020-08-09 ENCOUNTER — Ambulatory Visit: Payer: Medicare Other

## 2020-08-09 DIAGNOSIS — L503 Dermatographic urticaria: Secondary | ICD-10-CM | POA: Diagnosis not present

## 2020-08-10 NOTE — Progress Notes (Signed)
Pt is taking prednisone currently for rash and dermatologist gave Betamethasone dipropionate 5% cream - is currently taking Lopressor 25 mg bid

## 2020-08-16 DIAGNOSIS — D696 Thrombocytopenia, unspecified: Secondary | ICD-10-CM | POA: Diagnosis not present

## 2020-08-16 DIAGNOSIS — I4891 Unspecified atrial fibrillation: Secondary | ICD-10-CM | POA: Diagnosis not present

## 2020-08-16 DIAGNOSIS — R21 Rash and other nonspecific skin eruption: Secondary | ICD-10-CM | POA: Diagnosis not present

## 2020-08-16 DIAGNOSIS — T45511A Poisoning by anticoagulants, accidental (unintentional), initial encounter: Secondary | ICD-10-CM | POA: Diagnosis not present

## 2020-08-22 DIAGNOSIS — I4891 Unspecified atrial fibrillation: Secondary | ICD-10-CM | POA: Diagnosis not present

## 2020-08-29 DIAGNOSIS — Z1389 Encounter for screening for other disorder: Secondary | ICD-10-CM | POA: Diagnosis not present

## 2020-08-29 DIAGNOSIS — R270 Ataxia, unspecified: Secondary | ICD-10-CM | POA: Diagnosis not present

## 2020-08-29 DIAGNOSIS — I482 Chronic atrial fibrillation, unspecified: Secondary | ICD-10-CM | POA: Diagnosis not present

## 2020-08-29 DIAGNOSIS — I10 Essential (primary) hypertension: Secondary | ICD-10-CM | POA: Diagnosis not present

## 2020-08-29 DIAGNOSIS — Z1331 Encounter for screening for depression: Secondary | ICD-10-CM | POA: Diagnosis not present

## 2020-08-29 DIAGNOSIS — I4891 Unspecified atrial fibrillation: Secondary | ICD-10-CM | POA: Diagnosis not present

## 2020-08-29 DIAGNOSIS — Z6821 Body mass index (BMI) 21.0-21.9, adult: Secondary | ICD-10-CM | POA: Diagnosis not present

## 2020-08-29 DIAGNOSIS — Z23 Encounter for immunization: Secondary | ICD-10-CM | POA: Diagnosis not present

## 2020-09-07 DIAGNOSIS — I4891 Unspecified atrial fibrillation: Secondary | ICD-10-CM | POA: Diagnosis not present

## 2020-09-21 DIAGNOSIS — I4891 Unspecified atrial fibrillation: Secondary | ICD-10-CM | POA: Diagnosis not present

## 2020-09-26 DIAGNOSIS — I482 Chronic atrial fibrillation, unspecified: Secondary | ICD-10-CM | POA: Diagnosis not present

## 2020-09-26 DIAGNOSIS — R270 Ataxia, unspecified: Secondary | ICD-10-CM | POA: Diagnosis not present

## 2020-09-26 DIAGNOSIS — I10 Essential (primary) hypertension: Secondary | ICD-10-CM | POA: Diagnosis not present

## 2020-09-26 DIAGNOSIS — I4891 Unspecified atrial fibrillation: Secondary | ICD-10-CM | POA: Diagnosis not present

## 2020-09-26 DIAGNOSIS — Z6821 Body mass index (BMI) 21.0-21.9, adult: Secondary | ICD-10-CM | POA: Diagnosis not present

## 2020-10-06 DIAGNOSIS — I4891 Unspecified atrial fibrillation: Secondary | ICD-10-CM | POA: Diagnosis not present

## 2020-10-26 DIAGNOSIS — L308 Other specified dermatitis: Secondary | ICD-10-CM | POA: Diagnosis not present

## 2020-10-26 DIAGNOSIS — L820 Inflamed seborrheic keratosis: Secondary | ICD-10-CM | POA: Diagnosis not present

## 2020-11-01 ENCOUNTER — Ambulatory Visit: Payer: Medicare Other | Admitting: Cardiology

## 2020-11-02 ENCOUNTER — Encounter: Payer: Self-pay | Admitting: Cardiology

## 2020-11-02 ENCOUNTER — Ambulatory Visit (INDEPENDENT_AMBULATORY_CARE_PROVIDER_SITE_OTHER): Payer: Medicare Other | Admitting: Cardiology

## 2020-11-02 VITALS — BP 120/78 | HR 111 | Ht 59.0 in | Wt 106.0 lb

## 2020-11-02 DIAGNOSIS — I4891 Unspecified atrial fibrillation: Secondary | ICD-10-CM

## 2020-11-02 NOTE — Patient Instructions (Addendum)
Your physician recommends that you schedule a follow-up appointment in: [place] has recommended you make the following change in your medication:   INCREASE LOPRESSOR 50 MG TWICE DAILY    Thank you for choosing [facility]!!

## 2020-11-02 NOTE — Progress Notes (Signed)
Clinical Summary Ms. Rothman is a 87 y.o.female seen today for a focused visit for history of afib.   1. Afib - not interested in DOACs due to cost - she reports long acting dilt is too expensive.   06/2020 admitted to [facility] with afib with RVR - was on dilt gtt transiently - discharged on oral lopressor 37.5mg  bid, dilt 30mg  bid - denies any recent palpitations.    - taking lopressor 37.5mg  bid, verapamil 20mg  bid. Changed from dilt due to cost  - no bleeding on coumadin      Has covid vaccine x 2 pfizer, Past Medical History:  Diagnosis Date  . Abnormal EKG    low voltage (no amyloid)  . Anxiety    May play a role with some symptoms  . Aortic valve sclerosis    Echo  04/2009  EF 65%.no stenosis.... echo... September, 2010  . Arthritis   . Atrial fibrillation ([place])    DCCV prior to 04/05/2009 but reverted A.Fib, consultation w/Dr.Klein, decision to use Propafenone and   cardiovert again..cardioversion June 08, 2009.[name] on 4 days Propafenone... 100 Joules... normal sinus rhythm... Cardizem stopped.. /  return to atrial fib.[name]   /   cardionet on cardizem...09/14/2009...rates  52-190.  On Coumadin  . Back pain    February, 2013  . Carotid stenosis    Dopplers 2009 better than past; Dopplers 09/07/2009 <50% bilateral-stable  . Chest pain    cardiac catheterization.. 2009... no significant CAD.[name] / recurrent chest pain 2010.. consider; EF 65% by Echo 04/2009  . Drug therapy    coumadin  . Dyspnea   . Ejection fraction   . GERD (gastroesophageal reflux disease)    Aspirin Stopped  . H/O Spinal surgery    Successful,  2013  . Hyperlipidemia   . Hypertension   . Shortness of breath    Shortness of breath and fatigue June, 2012  . Visual changes    Prior planned to have ophthalmology evlaution     No Known Allergies   Current Outpatient Medications  Medication Sig Dispense Refill  . Calcium Carbonate-Vitamin D (CALCIUM 600+D) 600-400 MG-UNIT per tablet  Take 1 tablet by mouth 2 (two) times daily.      [name] diltiazem (CARDIZEM) 60 MG tablet Take 30 mg by mouth 2 (two) times daily.     . furosemide (LASIX) 20 MG tablet Take 1 tablet (20 mg total) by mouth daily. 90 tablet 1  . metoprolol tartrate (LOPRESSOR) 50 MG tablet Take 1 tablet (50 mg total) by mouth 2 (two) times daily. 60 tablet 3  . Multiple Vitamin (MULTIVITAMIN) tablet Take 1 tablet by mouth daily.      . simvastatin (ZOCOR) 20 MG tablet Take 20 mg by mouth at bedtime.     . traMADol (ULTRAM) 50 MG tablet Take 25 mg by mouth every 12 (twelve) hours as needed.    . warfarin (COUMADIN) 1 MG tablet Take 1 tablet by mouth as directed. MANAGED BY DR. Nadara Mustard  0   No current facility-administered medications for this visit.     Past Surgical History:  Procedure Laterality Date  . BACK SURGERY     X 2, most recent 2009  . CATARACT EXTRACTION, BILATERAL       No Known Allergies    Family History  Problem Relation Age of Onset  . Cancer Other   . Coronary artery disease Other      Social History Ms. Quiggle reports that she  has never smoked. She has never used smokeless tobacco. Ms. Janak reports no history of alcohol use.   Review of Systems CONSTITUTIONAL:+fatigue HEENT: Eyes: No visual loss, blurred vision, double vision or yellow sclerae.No hearing loss, sneezing, congestion, runny nose or sore throat.  SKIN: No rash or itching.  CARDIOVASCULAR: + palpitations RESPIRATORY: No shortness of breath, cough or sputum.  GASTROINTESTINAL: No anorexia, nausea, vomiting or diarrhea. No abdominal pain or blood.  GENITOURINARY: No burning on urination, no polyuria NEUROLOGICAL: No headache, dizziness, syncope, paralysis, ataxia, numbness or tingling in the extremities. No change in bowel or bladder control.  MUSCULOSKELETAL: No muscle, back pain, joint pain or stiffness.  LYMPHATICS: No enlarged nodes. No history of splenectomy.  PSYCHIATRIC: No history of depression or  anxiety.  ENDOCRINOLOGIC: No reports of sweating, cold or heat intolerance. No polyuria or polydipsia.  [name]   Physical Examination Today's Vitals   11/02/20 0841  BP: 120/78  Pulse: (!) 111  SpO2: 90%  Weight: 106 lb (48.1 kg)  Height: 4\' 11"  (1.499 m)   Body mass index is 21.41 kg/m.  Gen: resting comfortably, no acute distress HEENT: no scleral icterus, pupils equal round and reactive, no palptable cervical adenopathy,  CV: irreg, 2/6 systolic murmur apex, no jvd Resp: Clear to auscultation bilaterally GI: abdomen is soft, non-tender, non-distended, normal bowel sounds, no hepatosplenomegaly MSK: extremities are warm, no edema.  Skin: warm, no rash Neuro:  no focal deficits Psych: appropriate affect      Assessment and Plan  1. Afib  -rates remain elevated, having some palpitations. EKG shows afib rate 111 - increase lopressor to 50mg  bid. Unclear if higher lopressor doses may contribute to prior rash, will try at this time and family keep [place] updated.      Arnoldo Lenis, M.D..

## 2020-11-03 DIAGNOSIS — D513 Other dietary vitamin B12 deficiency anemia: Secondary | ICD-10-CM | POA: Diagnosis not present

## 2020-11-03 DIAGNOSIS — D649 Anemia, unspecified: Secondary | ICD-10-CM | POA: Diagnosis not present

## 2020-11-03 DIAGNOSIS — I4891 Unspecified atrial fibrillation: Secondary | ICD-10-CM | POA: Diagnosis not present

## 2020-11-03 DIAGNOSIS — R5383 Other fatigue: Secondary | ICD-10-CM | POA: Diagnosis not present

## 2020-11-03 DIAGNOSIS — Z1329 Encounter for screening for other suspected endocrine disorder: Secondary | ICD-10-CM | POA: Diagnosis not present

## 2020-11-06 ENCOUNTER — Other Ambulatory Visit: Payer: Self-pay | Admitting: Cardiology

## 2020-11-08 DIAGNOSIS — Z6820 Body mass index (BMI) 20.0-20.9, adult: Secondary | ICD-10-CM | POA: Diagnosis not present

## 2020-11-08 DIAGNOSIS — R270 Ataxia, unspecified: Secondary | ICD-10-CM | POA: Diagnosis not present

## 2020-11-08 DIAGNOSIS — I10 Essential (primary) hypertension: Secondary | ICD-10-CM | POA: Diagnosis not present

## 2020-11-08 DIAGNOSIS — I482 Chronic atrial fibrillation, unspecified: Secondary | ICD-10-CM | POA: Diagnosis not present

## 2020-11-08 DIAGNOSIS — Z6821 Body mass index (BMI) 21.0-21.9, adult: Secondary | ICD-10-CM | POA: Diagnosis not present

## 2020-11-08 DIAGNOSIS — R202 Paresthesia of skin: Secondary | ICD-10-CM | POA: Diagnosis not present

## 2020-11-08 DIAGNOSIS — I4891 Unspecified atrial fibrillation: Secondary | ICD-10-CM | POA: Diagnosis not present

## 2020-11-20 DIAGNOSIS — T7840XA Allergy, unspecified, initial encounter: Secondary | ICD-10-CM | POA: Diagnosis not present

## 2020-11-20 DIAGNOSIS — L270 Generalized skin eruption due to drugs and medicaments taken internally: Secondary | ICD-10-CM | POA: Diagnosis not present

## 2020-11-29 DIAGNOSIS — Z7901 Long term (current) use of anticoagulants: Secondary | ICD-10-CM | POA: Diagnosis not present

## 2020-11-29 DIAGNOSIS — I5033 Acute on chronic diastolic (congestive) heart failure: Secondary | ICD-10-CM | POA: Diagnosis not present

## 2020-11-29 DIAGNOSIS — I482 Chronic atrial fibrillation, unspecified: Secondary | ICD-10-CM | POA: Diagnosis not present

## 2020-11-29 DIAGNOSIS — E785 Hyperlipidemia, unspecified: Secondary | ICD-10-CM | POA: Diagnosis not present

## 2020-11-29 DIAGNOSIS — J90 Pleural effusion, not elsewhere classified: Secondary | ICD-10-CM | POA: Diagnosis not present

## 2020-11-29 DIAGNOSIS — L509 Urticaria, unspecified: Secondary | ICD-10-CM | POA: Diagnosis not present

## 2020-11-29 DIAGNOSIS — I10 Essential (primary) hypertension: Secondary | ICD-10-CM | POA: Diagnosis not present

## 2020-11-29 DIAGNOSIS — R0902 Hypoxemia: Secondary | ICD-10-CM | POA: Diagnosis not present

## 2020-11-29 DIAGNOSIS — R0600 Dyspnea, unspecified: Secondary | ICD-10-CM | POA: Diagnosis not present

## 2020-11-29 DIAGNOSIS — J810 Acute pulmonary edema: Secondary | ICD-10-CM | POA: Diagnosis not present

## 2020-11-29 DIAGNOSIS — Z20822 Contact with and (suspected) exposure to covid-19: Secondary | ICD-10-CM | POA: Diagnosis not present

## 2020-11-29 DIAGNOSIS — R918 Other nonspecific abnormal finding of lung field: Secondary | ICD-10-CM | POA: Diagnosis not present

## 2020-11-29 DIAGNOSIS — I4891 Unspecified atrial fibrillation: Secondary | ICD-10-CM | POA: Diagnosis not present

## 2020-11-29 DIAGNOSIS — R079 Chest pain, unspecified: Secondary | ICD-10-CM | POA: Diagnosis not present

## 2020-11-29 DIAGNOSIS — I4819 Other persistent atrial fibrillation: Secondary | ICD-10-CM | POA: Diagnosis not present

## 2020-11-29 DIAGNOSIS — I110 Hypertensive heart disease with heart failure: Secondary | ICD-10-CM | POA: Diagnosis not present

## 2020-11-29 DIAGNOSIS — E7800 Pure hypercholesterolemia, unspecified: Secondary | ICD-10-CM | POA: Diagnosis not present

## 2020-11-30 DIAGNOSIS — I499 Cardiac arrhythmia, unspecified: Secondary | ICD-10-CM | POA: Diagnosis not present

## 2020-11-30 DIAGNOSIS — I4819 Other persistent atrial fibrillation: Secondary | ICD-10-CM | POA: Diagnosis present

## 2020-11-30 DIAGNOSIS — I4891 Unspecified atrial fibrillation: Secondary | ICD-10-CM | POA: Diagnosis not present

## 2020-11-30 DIAGNOSIS — E785 Hyperlipidemia, unspecified: Secondary | ICD-10-CM | POA: Diagnosis present

## 2020-11-30 DIAGNOSIS — Z7901 Long term (current) use of anticoagulants: Secondary | ICD-10-CM | POA: Diagnosis not present

## 2020-11-30 DIAGNOSIS — I110 Hypertensive heart disease with heart failure: Secondary | ICD-10-CM | POA: Diagnosis present

## 2020-11-30 DIAGNOSIS — R0601 Orthopnea: Secondary | ICD-10-CM | POA: Diagnosis present

## 2020-11-30 DIAGNOSIS — I340 Nonrheumatic mitral (valve) insufficiency: Secondary | ICD-10-CM | POA: Diagnosis present

## 2020-11-30 DIAGNOSIS — I50810 Right heart failure, unspecified: Secondary | ICD-10-CM | POA: Diagnosis not present

## 2020-11-30 DIAGNOSIS — Z79899 Other long term (current) drug therapy: Secondary | ICD-10-CM | POA: Diagnosis not present

## 2020-11-30 DIAGNOSIS — R0600 Dyspnea, unspecified: Secondary | ICD-10-CM | POA: Diagnosis not present

## 2020-11-30 DIAGNOSIS — L509 Urticaria, unspecified: Secondary | ICD-10-CM | POA: Diagnosis present

## 2020-11-30 DIAGNOSIS — Z28311 Partially vaccinated for covid-19: Secondary | ICD-10-CM | POA: Diagnosis not present

## 2020-11-30 DIAGNOSIS — I5033 Acute on chronic diastolic (congestive) heart failure: Secondary | ICD-10-CM | POA: Diagnosis present

## 2020-11-30 DIAGNOSIS — R0902 Hypoxemia: Secondary | ICD-10-CM | POA: Diagnosis present

## 2020-11-30 DIAGNOSIS — Z20822 Contact with and (suspected) exposure to covid-19: Secondary | ICD-10-CM | POA: Diagnosis present

## 2020-11-30 DIAGNOSIS — I10 Essential (primary) hypertension: Secondary | ICD-10-CM | POA: Diagnosis not present

## 2020-11-30 DIAGNOSIS — I482 Chronic atrial fibrillation, unspecified: Secondary | ICD-10-CM | POA: Diagnosis not present

## 2020-11-30 DIAGNOSIS — E7800 Pure hypercholesterolemia, unspecified: Secondary | ICD-10-CM | POA: Diagnosis not present

## 2020-11-30 DIAGNOSIS — I088 Other rheumatic multiple valve diseases: Secondary | ICD-10-CM | POA: Diagnosis not present

## 2020-12-05 DIAGNOSIS — I4891 Unspecified atrial fibrillation: Secondary | ICD-10-CM | POA: Diagnosis not present

## 2020-12-07 DIAGNOSIS — T887XXD Unspecified adverse effect of drug or medicament, subsequent encounter: Secondary | ICD-10-CM | POA: Diagnosis not present

## 2020-12-13 DIAGNOSIS — R21 Rash and other nonspecific skin eruption: Secondary | ICD-10-CM | POA: Diagnosis not present

## 2020-12-13 DIAGNOSIS — Z6820 Body mass index (BMI) 20.0-20.9, adult: Secondary | ICD-10-CM | POA: Diagnosis not present

## 2020-12-13 DIAGNOSIS — E7801 Familial hypercholesterolemia: Secondary | ICD-10-CM | POA: Diagnosis not present

## 2020-12-13 DIAGNOSIS — I4891 Unspecified atrial fibrillation: Secondary | ICD-10-CM | POA: Diagnosis not present

## 2020-12-20 DIAGNOSIS — R21 Rash and other nonspecific skin eruption: Secondary | ICD-10-CM | POA: Diagnosis not present

## 2020-12-20 DIAGNOSIS — J300 Vasomotor rhinitis: Secondary | ICD-10-CM | POA: Diagnosis not present

## 2020-12-20 DIAGNOSIS — L299 Pruritus, unspecified: Secondary | ICD-10-CM | POA: Diagnosis not present

## 2020-12-21 DIAGNOSIS — I4891 Unspecified atrial fibrillation: Secondary | ICD-10-CM | POA: Diagnosis not present

## 2020-12-25 DIAGNOSIS — R5383 Other fatigue: Secondary | ICD-10-CM | POA: Diagnosis not present

## 2020-12-25 DIAGNOSIS — E7800 Pure hypercholesterolemia, unspecified: Secondary | ICD-10-CM | POA: Diagnosis not present

## 2020-12-25 DIAGNOSIS — E7801 Familial hypercholesterolemia: Secondary | ICD-10-CM | POA: Diagnosis not present

## 2020-12-27 DIAGNOSIS — E7801 Familial hypercholesterolemia: Secondary | ICD-10-CM | POA: Diagnosis not present

## 2020-12-27 DIAGNOSIS — E7800 Pure hypercholesterolemia, unspecified: Secondary | ICD-10-CM | POA: Diagnosis not present

## 2020-12-28 DIAGNOSIS — E7801 Familial hypercholesterolemia: Secondary | ICD-10-CM | POA: Diagnosis not present

## 2020-12-28 DIAGNOSIS — I4891 Unspecified atrial fibrillation: Secondary | ICD-10-CM | POA: Diagnosis not present

## 2020-12-28 DIAGNOSIS — R21 Rash and other nonspecific skin eruption: Secondary | ICD-10-CM | POA: Diagnosis not present

## 2020-12-28 DIAGNOSIS — R609 Edema, unspecified: Secondary | ICD-10-CM | POA: Diagnosis not present

## 2020-12-28 DIAGNOSIS — Z6821 Body mass index (BMI) 21.0-21.9, adult: Secondary | ICD-10-CM | POA: Diagnosis not present

## 2020-12-31 DIAGNOSIS — R21 Rash and other nonspecific skin eruption: Secondary | ICD-10-CM | POA: Diagnosis not present

## 2020-12-31 DIAGNOSIS — E7800 Pure hypercholesterolemia, unspecified: Secondary | ICD-10-CM | POA: Diagnosis not present

## 2020-12-31 DIAGNOSIS — I4891 Unspecified atrial fibrillation: Secondary | ICD-10-CM | POA: Diagnosis not present

## 2021-01-01 DIAGNOSIS — H401131 Primary open-angle glaucoma, bilateral, mild stage: Secondary | ICD-10-CM | POA: Diagnosis not present

## 2021-01-01 DIAGNOSIS — H43813 Vitreous degeneration, bilateral: Secondary | ICD-10-CM | POA: Diagnosis not present

## 2021-01-01 DIAGNOSIS — Z961 Presence of intraocular lens: Secondary | ICD-10-CM | POA: Diagnosis not present

## 2021-01-01 DIAGNOSIS — H5213 Myopia, bilateral: Secondary | ICD-10-CM | POA: Diagnosis not present

## 2021-01-01 DIAGNOSIS — H524 Presbyopia: Secondary | ICD-10-CM | POA: Diagnosis not present

## 2021-01-01 DIAGNOSIS — H353131 Nonexudative age-related macular degeneration, bilateral, early dry stage: Secondary | ICD-10-CM | POA: Diagnosis not present

## 2021-01-01 DIAGNOSIS — H52223 Regular astigmatism, bilateral: Secondary | ICD-10-CM | POA: Diagnosis not present

## 2021-01-03 DIAGNOSIS — R21 Rash and other nonspecific skin eruption: Secondary | ICD-10-CM | POA: Diagnosis not present

## 2021-01-03 DIAGNOSIS — Z6822 Body mass index (BMI) 22.0-22.9, adult: Secondary | ICD-10-CM | POA: Diagnosis not present

## 2021-01-03 DIAGNOSIS — I4891 Unspecified atrial fibrillation: Secondary | ICD-10-CM | POA: Diagnosis not present

## 2021-01-03 DIAGNOSIS — E7801 Familial hypercholesterolemia: Secondary | ICD-10-CM | POA: Diagnosis not present

## 2021-01-03 DIAGNOSIS — R609 Edema, unspecified: Secondary | ICD-10-CM | POA: Diagnosis not present

## 2021-01-05 DIAGNOSIS — I38 Endocarditis, valve unspecified: Secondary | ICD-10-CM | POA: Diagnosis not present

## 2021-01-05 DIAGNOSIS — E7800 Pure hypercholesterolemia, unspecified: Secondary | ICD-10-CM | POA: Diagnosis not present

## 2021-01-05 DIAGNOSIS — L509 Urticaria, unspecified: Secondary | ICD-10-CM | POA: Diagnosis not present

## 2021-01-05 DIAGNOSIS — I10 Essential (primary) hypertension: Secondary | ICD-10-CM | POA: Diagnosis not present

## 2021-01-05 DIAGNOSIS — I4821 Permanent atrial fibrillation: Secondary | ICD-10-CM | POA: Diagnosis not present

## 2021-01-05 DIAGNOSIS — I5031 Acute diastolic (congestive) heart failure: Secondary | ICD-10-CM | POA: Diagnosis not present

## 2021-01-11 DIAGNOSIS — E7801 Familial hypercholesterolemia: Secondary | ICD-10-CM | POA: Diagnosis not present

## 2021-01-11 DIAGNOSIS — E7800 Pure hypercholesterolemia, unspecified: Secondary | ICD-10-CM | POA: Diagnosis not present

## 2021-01-13 DIAGNOSIS — J9811 Atelectasis: Secondary | ICD-10-CM | POA: Diagnosis not present

## 2021-01-13 DIAGNOSIS — Z20822 Contact with and (suspected) exposure to covid-19: Secondary | ICD-10-CM | POA: Diagnosis not present

## 2021-01-13 DIAGNOSIS — Z604 Social exclusion and rejection: Secondary | ICD-10-CM | POA: Diagnosis present

## 2021-01-13 DIAGNOSIS — D649 Anemia, unspecified: Secondary | ICD-10-CM | POA: Diagnosis present

## 2021-01-13 DIAGNOSIS — R1013 Epigastric pain: Secondary | ICD-10-CM | POA: Diagnosis not present

## 2021-01-13 DIAGNOSIS — Z66 Do not resuscitate: Secondary | ICD-10-CM | POA: Diagnosis not present

## 2021-01-13 DIAGNOSIS — I110 Hypertensive heart disease with heart failure: Secondary | ICD-10-CM | POA: Diagnosis not present

## 2021-01-13 DIAGNOSIS — I38 Endocarditis, valve unspecified: Secondary | ICD-10-CM | POA: Diagnosis not present

## 2021-01-13 DIAGNOSIS — Z888 Allergy status to other drugs, medicaments and biological substances status: Secondary | ICD-10-CM | POA: Diagnosis not present

## 2021-01-13 DIAGNOSIS — R0902 Hypoxemia: Secondary | ICD-10-CM | POA: Diagnosis not present

## 2021-01-13 DIAGNOSIS — R079 Chest pain, unspecified: Secondary | ICD-10-CM | POA: Diagnosis not present

## 2021-01-13 DIAGNOSIS — I130 Hypertensive heart and chronic kidney disease with heart failure and stage 1 through stage 4 chronic kidney disease, or unspecified chronic kidney disease: Secondary | ICD-10-CM | POA: Diagnosis not present

## 2021-01-13 DIAGNOSIS — N183 Chronic kidney disease, stage 3 unspecified: Secondary | ICD-10-CM | POA: Diagnosis not present

## 2021-01-13 DIAGNOSIS — N289 Disorder of kidney and ureter, unspecified: Secondary | ICD-10-CM | POA: Diagnosis not present

## 2021-01-13 DIAGNOSIS — I5032 Chronic diastolic (congestive) heart failure: Secondary | ICD-10-CM | POA: Diagnosis not present

## 2021-01-13 DIAGNOSIS — I4821 Permanent atrial fibrillation: Secondary | ICD-10-CM | POA: Diagnosis not present

## 2021-01-13 DIAGNOSIS — N179 Acute kidney failure, unspecified: Secondary | ICD-10-CM | POA: Diagnosis not present

## 2021-01-13 DIAGNOSIS — J90 Pleural effusion, not elsewhere classified: Secondary | ICD-10-CM | POA: Diagnosis not present

## 2021-01-13 DIAGNOSIS — E785 Hyperlipidemia, unspecified: Secondary | ICD-10-CM | POA: Diagnosis present

## 2021-01-13 DIAGNOSIS — Z79899 Other long term (current) drug therapy: Secondary | ICD-10-CM | POA: Diagnosis not present

## 2021-01-13 DIAGNOSIS — Z7901 Long term (current) use of anticoagulants: Secondary | ICD-10-CM | POA: Diagnosis not present

## 2021-01-19 DIAGNOSIS — J90 Pleural effusion, not elsewhere classified: Secondary | ICD-10-CM | POA: Diagnosis not present

## 2021-01-19 DIAGNOSIS — I4821 Permanent atrial fibrillation: Secondary | ICD-10-CM | POA: Diagnosis not present

## 2021-01-19 DIAGNOSIS — D631 Anemia in chronic kidney disease: Secondary | ICD-10-CM | POA: Diagnosis not present

## 2021-01-19 DIAGNOSIS — Z7901 Long term (current) use of anticoagulants: Secondary | ICD-10-CM | POA: Diagnosis not present

## 2021-01-19 DIAGNOSIS — I5032 Chronic diastolic (congestive) heart failure: Secondary | ICD-10-CM | POA: Diagnosis not present

## 2021-01-19 DIAGNOSIS — Z9181 History of falling: Secondary | ICD-10-CM | POA: Diagnosis not present

## 2021-01-19 DIAGNOSIS — I130 Hypertensive heart and chronic kidney disease with heart failure and stage 1 through stage 4 chronic kidney disease, or unspecified chronic kidney disease: Secondary | ICD-10-CM | POA: Diagnosis not present

## 2021-01-19 DIAGNOSIS — N183 Chronic kidney disease, stage 3 unspecified: Secondary | ICD-10-CM | POA: Diagnosis not present

## 2021-01-19 DIAGNOSIS — R0902 Hypoxemia: Secondary | ICD-10-CM | POA: Diagnosis not present

## 2021-01-24 DIAGNOSIS — Z681 Body mass index (BMI) 19 or less, adult: Secondary | ICD-10-CM | POA: Diagnosis not present

## 2021-01-24 DIAGNOSIS — J90 Pleural effusion, not elsewhere classified: Secondary | ICD-10-CM | POA: Diagnosis not present

## 2021-01-24 DIAGNOSIS — Z23 Encounter for immunization: Secondary | ICD-10-CM | POA: Diagnosis not present

## 2021-01-24 DIAGNOSIS — N179 Acute kidney failure, unspecified: Secondary | ICD-10-CM | POA: Diagnosis not present

## 2021-01-24 DIAGNOSIS — I38 Endocarditis, valve unspecified: Secondary | ICD-10-CM | POA: Diagnosis not present

## 2021-01-25 DIAGNOSIS — I130 Hypertensive heart and chronic kidney disease with heart failure and stage 1 through stage 4 chronic kidney disease, or unspecified chronic kidney disease: Secondary | ICD-10-CM | POA: Diagnosis not present

## 2021-01-25 DIAGNOSIS — N183 Chronic kidney disease, stage 3 unspecified: Secondary | ICD-10-CM | POA: Diagnosis not present

## 2021-01-25 DIAGNOSIS — R0902 Hypoxemia: Secondary | ICD-10-CM | POA: Diagnosis not present

## 2021-01-25 DIAGNOSIS — J90 Pleural effusion, not elsewhere classified: Secondary | ICD-10-CM | POA: Diagnosis not present

## 2021-01-25 DIAGNOSIS — I5032 Chronic diastolic (congestive) heart failure: Secondary | ICD-10-CM | POA: Diagnosis not present

## 2021-01-25 DIAGNOSIS — D631 Anemia in chronic kidney disease: Secondary | ICD-10-CM | POA: Diagnosis not present

## 2021-01-30 DIAGNOSIS — J90 Pleural effusion, not elsewhere classified: Secondary | ICD-10-CM | POA: Diagnosis not present

## 2021-01-30 DIAGNOSIS — I5032 Chronic diastolic (congestive) heart failure: Secondary | ICD-10-CM | POA: Diagnosis not present

## 2021-01-30 DIAGNOSIS — I130 Hypertensive heart and chronic kidney disease with heart failure and stage 1 through stage 4 chronic kidney disease, or unspecified chronic kidney disease: Secondary | ICD-10-CM | POA: Diagnosis not present

## 2021-01-30 DIAGNOSIS — D631 Anemia in chronic kidney disease: Secondary | ICD-10-CM | POA: Diagnosis not present

## 2021-01-30 DIAGNOSIS — I4821 Permanent atrial fibrillation: Secondary | ICD-10-CM | POA: Diagnosis not present

## 2021-01-30 DIAGNOSIS — E7800 Pure hypercholesterolemia, unspecified: Secondary | ICD-10-CM | POA: Diagnosis not present

## 2021-01-30 DIAGNOSIS — I350 Nonrheumatic aortic (valve) stenosis: Secondary | ICD-10-CM | POA: Diagnosis not present

## 2021-01-30 DIAGNOSIS — I10 Essential (primary) hypertension: Secondary | ICD-10-CM | POA: Diagnosis not present

## 2021-01-30 DIAGNOSIS — R0902 Hypoxemia: Secondary | ICD-10-CM | POA: Diagnosis not present

## 2021-01-30 DIAGNOSIS — N183 Chronic kidney disease, stage 3 unspecified: Secondary | ICD-10-CM | POA: Diagnosis not present

## 2021-01-31 DIAGNOSIS — I4891 Unspecified atrial fibrillation: Secondary | ICD-10-CM | POA: Diagnosis not present

## 2021-01-31 DIAGNOSIS — N289 Disorder of kidney and ureter, unspecified: Secondary | ICD-10-CM | POA: Diagnosis not present

## 2021-02-06 DIAGNOSIS — I130 Hypertensive heart and chronic kidney disease with heart failure and stage 1 through stage 4 chronic kidney disease, or unspecified chronic kidney disease: Secondary | ICD-10-CM | POA: Diagnosis not present

## 2021-02-06 DIAGNOSIS — N183 Chronic kidney disease, stage 3 unspecified: Secondary | ICD-10-CM | POA: Diagnosis not present

## 2021-02-06 DIAGNOSIS — I5032 Chronic diastolic (congestive) heart failure: Secondary | ICD-10-CM | POA: Diagnosis not present

## 2021-02-06 DIAGNOSIS — J90 Pleural effusion, not elsewhere classified: Secondary | ICD-10-CM | POA: Diagnosis not present

## 2021-02-06 DIAGNOSIS — D631 Anemia in chronic kidney disease: Secondary | ICD-10-CM | POA: Diagnosis not present

## 2021-02-06 DIAGNOSIS — R0902 Hypoxemia: Secondary | ICD-10-CM | POA: Diagnosis not present

## 2021-02-08 DIAGNOSIS — E7801 Familial hypercholesterolemia: Secondary | ICD-10-CM | POA: Diagnosis not present

## 2021-02-08 DIAGNOSIS — R21 Rash and other nonspecific skin eruption: Secondary | ICD-10-CM | POA: Diagnosis not present

## 2021-02-08 DIAGNOSIS — Z6820 Body mass index (BMI) 20.0-20.9, adult: Secondary | ICD-10-CM | POA: Diagnosis not present

## 2021-02-08 DIAGNOSIS — I4891 Unspecified atrial fibrillation: Secondary | ICD-10-CM | POA: Diagnosis not present

## 2021-02-08 DIAGNOSIS — I38 Endocarditis, valve unspecified: Secondary | ICD-10-CM | POA: Diagnosis not present

## 2021-02-13 DIAGNOSIS — I5032 Chronic diastolic (congestive) heart failure: Secondary | ICD-10-CM | POA: Diagnosis not present

## 2021-02-13 DIAGNOSIS — R0902 Hypoxemia: Secondary | ICD-10-CM | POA: Diagnosis not present

## 2021-02-13 DIAGNOSIS — D631 Anemia in chronic kidney disease: Secondary | ICD-10-CM | POA: Diagnosis not present

## 2021-02-13 DIAGNOSIS — N183 Chronic kidney disease, stage 3 unspecified: Secondary | ICD-10-CM | POA: Diagnosis not present

## 2021-02-13 DIAGNOSIS — I130 Hypertensive heart and chronic kidney disease with heart failure and stage 1 through stage 4 chronic kidney disease, or unspecified chronic kidney disease: Secondary | ICD-10-CM | POA: Diagnosis not present

## 2021-02-13 DIAGNOSIS — J90 Pleural effusion, not elsewhere classified: Secondary | ICD-10-CM | POA: Diagnosis not present

## 2021-02-16 DIAGNOSIS — N179 Acute kidney failure, unspecified: Secondary | ICD-10-CM | POA: Diagnosis not present

## 2021-02-16 DIAGNOSIS — Z681 Body mass index (BMI) 19 or less, adult: Secondary | ICD-10-CM | POA: Diagnosis not present

## 2021-02-16 DIAGNOSIS — I38 Endocarditis, valve unspecified: Secondary | ICD-10-CM | POA: Diagnosis not present

## 2021-02-16 DIAGNOSIS — I482 Chronic atrial fibrillation, unspecified: Secondary | ICD-10-CM | POA: Diagnosis not present

## 2021-02-16 DIAGNOSIS — J90 Pleural effusion, not elsewhere classified: Secondary | ICD-10-CM | POA: Diagnosis not present

## 2021-02-18 DIAGNOSIS — I4821 Permanent atrial fibrillation: Secondary | ICD-10-CM | POA: Diagnosis not present

## 2021-02-18 DIAGNOSIS — Z7901 Long term (current) use of anticoagulants: Secondary | ICD-10-CM | POA: Diagnosis not present

## 2021-02-18 DIAGNOSIS — N183 Chronic kidney disease, stage 3 unspecified: Secondary | ICD-10-CM | POA: Diagnosis not present

## 2021-02-18 DIAGNOSIS — D631 Anemia in chronic kidney disease: Secondary | ICD-10-CM | POA: Diagnosis not present

## 2021-02-18 DIAGNOSIS — I5032 Chronic diastolic (congestive) heart failure: Secondary | ICD-10-CM | POA: Diagnosis not present

## 2021-02-18 DIAGNOSIS — I130 Hypertensive heart and chronic kidney disease with heart failure and stage 1 through stage 4 chronic kidney disease, or unspecified chronic kidney disease: Secondary | ICD-10-CM | POA: Diagnosis not present

## 2021-02-18 DIAGNOSIS — Z9181 History of falling: Secondary | ICD-10-CM | POA: Diagnosis not present

## 2021-02-18 DIAGNOSIS — R0902 Hypoxemia: Secondary | ICD-10-CM | POA: Diagnosis not present

## 2021-02-18 DIAGNOSIS — J90 Pleural effusion, not elsewhere classified: Secondary | ICD-10-CM | POA: Diagnosis not present

## 2021-02-19 ENCOUNTER — Ambulatory Visit: Payer: Medicare Other | Admitting: Cardiology

## 2021-02-20 DIAGNOSIS — I5032 Chronic diastolic (congestive) heart failure: Secondary | ICD-10-CM | POA: Diagnosis not present

## 2021-02-20 DIAGNOSIS — D631 Anemia in chronic kidney disease: Secondary | ICD-10-CM | POA: Diagnosis not present

## 2021-02-20 DIAGNOSIS — J90 Pleural effusion, not elsewhere classified: Secondary | ICD-10-CM | POA: Diagnosis not present

## 2021-02-20 DIAGNOSIS — R0902 Hypoxemia: Secondary | ICD-10-CM | POA: Diagnosis not present

## 2021-02-20 DIAGNOSIS — I130 Hypertensive heart and chronic kidney disease with heart failure and stage 1 through stage 4 chronic kidney disease, or unspecified chronic kidney disease: Secondary | ICD-10-CM | POA: Diagnosis not present

## 2021-02-20 DIAGNOSIS — N183 Chronic kidney disease, stage 3 unspecified: Secondary | ICD-10-CM | POA: Diagnosis not present

## 2021-02-27 DIAGNOSIS — N183 Chronic kidney disease, stage 3 unspecified: Secondary | ICD-10-CM | POA: Diagnosis not present

## 2021-02-27 DIAGNOSIS — I5032 Chronic diastolic (congestive) heart failure: Secondary | ICD-10-CM | POA: Diagnosis not present

## 2021-02-27 DIAGNOSIS — I130 Hypertensive heart and chronic kidney disease with heart failure and stage 1 through stage 4 chronic kidney disease, or unspecified chronic kidney disease: Secondary | ICD-10-CM | POA: Diagnosis not present

## 2021-02-27 DIAGNOSIS — J90 Pleural effusion, not elsewhere classified: Secondary | ICD-10-CM | POA: Diagnosis not present

## 2021-02-27 DIAGNOSIS — D631 Anemia in chronic kidney disease: Secondary | ICD-10-CM | POA: Diagnosis not present

## 2021-02-27 DIAGNOSIS — R0902 Hypoxemia: Secondary | ICD-10-CM | POA: Diagnosis not present

## 2021-03-04 DIAGNOSIS — Z20822 Contact with and (suspected) exposure to covid-19: Secondary | ICD-10-CM | POA: Diagnosis not present

## 2021-03-04 DIAGNOSIS — Z6820 Body mass index (BMI) 20.0-20.9, adult: Secondary | ICD-10-CM | POA: Diagnosis not present

## 2021-03-04 DIAGNOSIS — I5033 Acute on chronic diastolic (congestive) heart failure: Secondary | ICD-10-CM | POA: Diagnosis present

## 2021-03-04 DIAGNOSIS — Z7901 Long term (current) use of anticoagulants: Secondary | ICD-10-CM | POA: Diagnosis not present

## 2021-03-04 DIAGNOSIS — Z9181 History of falling: Secondary | ICD-10-CM | POA: Diagnosis not present

## 2021-03-04 DIAGNOSIS — Z79899 Other long term (current) drug therapy: Secondary | ICD-10-CM | POA: Diagnosis not present

## 2021-03-04 DIAGNOSIS — H9193 Unspecified hearing loss, bilateral: Secondary | ICD-10-CM | POA: Diagnosis present

## 2021-03-04 DIAGNOSIS — E785 Hyperlipidemia, unspecified: Secondary | ICD-10-CM | POA: Diagnosis present

## 2021-03-04 DIAGNOSIS — R079 Chest pain, unspecified: Secondary | ICD-10-CM | POA: Diagnosis not present

## 2021-03-04 DIAGNOSIS — E440 Moderate protein-calorie malnutrition: Secondary | ICD-10-CM | POA: Diagnosis present

## 2021-03-04 DIAGNOSIS — I110 Hypertensive heart disease with heart failure: Secondary | ICD-10-CM | POA: Diagnosis not present

## 2021-03-04 DIAGNOSIS — I340 Nonrheumatic mitral (valve) insufficiency: Secondary | ICD-10-CM | POA: Diagnosis not present

## 2021-03-04 DIAGNOSIS — I517 Cardiomegaly: Secondary | ICD-10-CM | POA: Diagnosis not present

## 2021-03-04 DIAGNOSIS — N189 Chronic kidney disease, unspecified: Secondary | ICD-10-CM | POA: Diagnosis present

## 2021-03-04 DIAGNOSIS — Z7982 Long term (current) use of aspirin: Secondary | ICD-10-CM | POA: Diagnosis not present

## 2021-03-04 DIAGNOSIS — I272 Pulmonary hypertension, unspecified: Secondary | ICD-10-CM | POA: Diagnosis present

## 2021-03-04 DIAGNOSIS — I350 Nonrheumatic aortic (valve) stenosis: Secondary | ICD-10-CM | POA: Diagnosis not present

## 2021-03-04 DIAGNOSIS — R531 Weakness: Secondary | ICD-10-CM | POA: Diagnosis present

## 2021-03-04 DIAGNOSIS — I4821 Permanent atrial fibrillation: Secondary | ICD-10-CM | POA: Diagnosis not present

## 2021-03-04 DIAGNOSIS — R0600 Dyspnea, unspecified: Secondary | ICD-10-CM | POA: Diagnosis not present

## 2021-03-04 DIAGNOSIS — Z66 Do not resuscitate: Secondary | ICD-10-CM | POA: Diagnosis present

## 2021-03-04 DIAGNOSIS — J90 Pleural effusion, not elsewhere classified: Secondary | ICD-10-CM | POA: Diagnosis not present

## 2021-03-04 DIAGNOSIS — I38 Endocarditis, valve unspecified: Secondary | ICD-10-CM | POA: Diagnosis not present

## 2021-03-04 DIAGNOSIS — I4891 Unspecified atrial fibrillation: Secondary | ICD-10-CM | POA: Diagnosis not present

## 2021-03-04 DIAGNOSIS — I130 Hypertensive heart and chronic kidney disease with heart failure and stage 1 through stage 4 chronic kidney disease, or unspecified chronic kidney disease: Secondary | ICD-10-CM | POA: Diagnosis present

## 2021-03-04 DIAGNOSIS — I083 Combined rheumatic disorders of mitral, aortic and tricuspid valves: Secondary | ICD-10-CM | POA: Diagnosis present

## 2021-03-04 DIAGNOSIS — I5031 Acute diastolic (congestive) heart failure: Secondary | ICD-10-CM | POA: Diagnosis not present

## 2021-03-06 DIAGNOSIS — I083 Combined rheumatic disorders of mitral, aortic and tricuspid valves: Secondary | ICD-10-CM | POA: Diagnosis not present

## 2021-03-09 DIAGNOSIS — D631 Anemia in chronic kidney disease: Secondary | ICD-10-CM | POA: Diagnosis not present

## 2021-03-09 DIAGNOSIS — I5032 Chronic diastolic (congestive) heart failure: Secondary | ICD-10-CM | POA: Diagnosis not present

## 2021-03-09 DIAGNOSIS — R0902 Hypoxemia: Secondary | ICD-10-CM | POA: Diagnosis not present

## 2021-03-09 DIAGNOSIS — N183 Chronic kidney disease, stage 3 unspecified: Secondary | ICD-10-CM | POA: Diagnosis not present

## 2021-03-09 DIAGNOSIS — I130 Hypertensive heart and chronic kidney disease with heart failure and stage 1 through stage 4 chronic kidney disease, or unspecified chronic kidney disease: Secondary | ICD-10-CM | POA: Diagnosis not present

## 2021-03-09 DIAGNOSIS — I4891 Unspecified atrial fibrillation: Secondary | ICD-10-CM | POA: Diagnosis not present

## 2021-03-09 DIAGNOSIS — J90 Pleural effusion, not elsewhere classified: Secondary | ICD-10-CM | POA: Diagnosis not present

## 2021-03-15 DIAGNOSIS — R5383 Other fatigue: Secondary | ICD-10-CM | POA: Diagnosis not present

## 2021-03-15 DIAGNOSIS — N183 Chronic kidney disease, stage 3 unspecified: Secondary | ICD-10-CM | POA: Diagnosis not present

## 2021-03-15 DIAGNOSIS — D631 Anemia in chronic kidney disease: Secondary | ICD-10-CM | POA: Diagnosis not present

## 2021-03-15 DIAGNOSIS — J90 Pleural effusion, not elsewhere classified: Secondary | ICD-10-CM | POA: Diagnosis not present

## 2021-03-15 DIAGNOSIS — I130 Hypertensive heart and chronic kidney disease with heart failure and stage 1 through stage 4 chronic kidney disease, or unspecified chronic kidney disease: Secondary | ICD-10-CM | POA: Diagnosis not present

## 2021-03-15 DIAGNOSIS — I5032 Chronic diastolic (congestive) heart failure: Secondary | ICD-10-CM | POA: Diagnosis not present

## 2021-03-15 DIAGNOSIS — R0902 Hypoxemia: Secondary | ICD-10-CM | POA: Diagnosis not present

## 2021-03-15 DIAGNOSIS — I482 Chronic atrial fibrillation, unspecified: Secondary | ICD-10-CM | POA: Diagnosis not present

## 2021-03-15 DIAGNOSIS — I509 Heart failure, unspecified: Secondary | ICD-10-CM | POA: Diagnosis not present

## 2021-03-20 DIAGNOSIS — S41111D Laceration without foreign body of right upper arm, subsequent encounter: Secondary | ICD-10-CM | POA: Diagnosis not present

## 2021-03-20 DIAGNOSIS — N183 Chronic kidney disease, stage 3 unspecified: Secondary | ICD-10-CM | POA: Diagnosis not present

## 2021-03-20 DIAGNOSIS — I4821 Permanent atrial fibrillation: Secondary | ICD-10-CM | POA: Diagnosis not present

## 2021-03-20 DIAGNOSIS — I130 Hypertensive heart and chronic kidney disease with heart failure and stage 1 through stage 4 chronic kidney disease, or unspecified chronic kidney disease: Secondary | ICD-10-CM | POA: Diagnosis not present

## 2021-03-20 DIAGNOSIS — D631 Anemia in chronic kidney disease: Secondary | ICD-10-CM | POA: Diagnosis not present

## 2021-03-20 DIAGNOSIS — Z9181 History of falling: Secondary | ICD-10-CM | POA: Diagnosis not present

## 2021-03-20 DIAGNOSIS — Z7982 Long term (current) use of aspirin: Secondary | ICD-10-CM | POA: Diagnosis not present

## 2021-03-20 DIAGNOSIS — I5033 Acute on chronic diastolic (congestive) heart failure: Secondary | ICD-10-CM | POA: Diagnosis not present

## 2021-03-23 DIAGNOSIS — I482 Chronic atrial fibrillation, unspecified: Secondary | ICD-10-CM | POA: Diagnosis not present

## 2021-03-28 DIAGNOSIS — E875 Hyperkalemia: Secondary | ICD-10-CM | POA: Diagnosis not present

## 2021-03-29 DIAGNOSIS — Z681 Body mass index (BMI) 19 or less, adult: Secondary | ICD-10-CM | POA: Diagnosis not present

## 2021-03-29 DIAGNOSIS — E7801 Familial hypercholesterolemia: Secondary | ICD-10-CM | POA: Diagnosis not present

## 2021-03-29 DIAGNOSIS — I4891 Unspecified atrial fibrillation: Secondary | ICD-10-CM | POA: Diagnosis not present

## 2021-03-29 DIAGNOSIS — I38 Endocarditis, valve unspecified: Secondary | ICD-10-CM | POA: Diagnosis not present

## 2021-03-29 DIAGNOSIS — R21 Rash and other nonspecific skin eruption: Secondary | ICD-10-CM | POA: Diagnosis not present

## 2021-04-04 DIAGNOSIS — S41111D Laceration without foreign body of right upper arm, subsequent encounter: Secondary | ICD-10-CM | POA: Diagnosis not present

## 2021-04-04 DIAGNOSIS — I130 Hypertensive heart and chronic kidney disease with heart failure and stage 1 through stage 4 chronic kidney disease, or unspecified chronic kidney disease: Secondary | ICD-10-CM | POA: Diagnosis not present

## 2021-04-04 DIAGNOSIS — N183 Chronic kidney disease, stage 3 unspecified: Secondary | ICD-10-CM | POA: Diagnosis not present

## 2021-04-04 DIAGNOSIS — I4821 Permanent atrial fibrillation: Secondary | ICD-10-CM | POA: Diagnosis not present

## 2021-04-04 DIAGNOSIS — D631 Anemia in chronic kidney disease: Secondary | ICD-10-CM | POA: Diagnosis not present

## 2021-04-04 DIAGNOSIS — I5033 Acute on chronic diastolic (congestive) heart failure: Secondary | ICD-10-CM | POA: Diagnosis not present

## 2021-04-06 DIAGNOSIS — I4891 Unspecified atrial fibrillation: Secondary | ICD-10-CM | POA: Diagnosis not present

## 2021-04-10 DIAGNOSIS — I5033 Acute on chronic diastolic (congestive) heart failure: Secondary | ICD-10-CM | POA: Diagnosis not present

## 2021-04-10 DIAGNOSIS — I130 Hypertensive heart and chronic kidney disease with heart failure and stage 1 through stage 4 chronic kidney disease, or unspecified chronic kidney disease: Secondary | ICD-10-CM | POA: Diagnosis not present

## 2021-04-10 DIAGNOSIS — D631 Anemia in chronic kidney disease: Secondary | ICD-10-CM | POA: Diagnosis not present

## 2021-04-10 DIAGNOSIS — I4821 Permanent atrial fibrillation: Secondary | ICD-10-CM | POA: Diagnosis not present

## 2021-04-10 DIAGNOSIS — S41111D Laceration without foreign body of right upper arm, subsequent encounter: Secondary | ICD-10-CM | POA: Diagnosis not present

## 2021-04-10 DIAGNOSIS — N183 Chronic kidney disease, stage 3 unspecified: Secondary | ICD-10-CM | POA: Diagnosis not present

## 2021-04-17 DIAGNOSIS — I5032 Chronic diastolic (congestive) heart failure: Secondary | ICD-10-CM | POA: Diagnosis not present

## 2021-04-17 DIAGNOSIS — I10 Essential (primary) hypertension: Secondary | ICD-10-CM | POA: Diagnosis not present

## 2021-04-17 DIAGNOSIS — I4821 Permanent atrial fibrillation: Secondary | ICD-10-CM | POA: Diagnosis not present

## 2021-04-19 DIAGNOSIS — Z7982 Long term (current) use of aspirin: Secondary | ICD-10-CM | POA: Diagnosis not present

## 2021-04-19 DIAGNOSIS — Z9181 History of falling: Secondary | ICD-10-CM | POA: Diagnosis not present

## 2021-04-19 DIAGNOSIS — I4821 Permanent atrial fibrillation: Secondary | ICD-10-CM | POA: Diagnosis not present

## 2021-04-19 DIAGNOSIS — I5033 Acute on chronic diastolic (congestive) heart failure: Secondary | ICD-10-CM | POA: Diagnosis not present

## 2021-04-19 DIAGNOSIS — S41111D Laceration without foreign body of right upper arm, subsequent encounter: Secondary | ICD-10-CM | POA: Diagnosis not present

## 2021-04-19 DIAGNOSIS — N183 Chronic kidney disease, stage 3 unspecified: Secondary | ICD-10-CM | POA: Diagnosis not present

## 2021-04-19 DIAGNOSIS — I130 Hypertensive heart and chronic kidney disease with heart failure and stage 1 through stage 4 chronic kidney disease, or unspecified chronic kidney disease: Secondary | ICD-10-CM | POA: Diagnosis not present

## 2021-04-19 DIAGNOSIS — D631 Anemia in chronic kidney disease: Secondary | ICD-10-CM | POA: Diagnosis not present

## 2021-05-01 DIAGNOSIS — N183 Chronic kidney disease, stage 3 unspecified: Secondary | ICD-10-CM | POA: Diagnosis not present

## 2021-05-01 DIAGNOSIS — I130 Hypertensive heart and chronic kidney disease with heart failure and stage 1 through stage 4 chronic kidney disease, or unspecified chronic kidney disease: Secondary | ICD-10-CM | POA: Diagnosis not present

## 2021-05-01 DIAGNOSIS — I5033 Acute on chronic diastolic (congestive) heart failure: Secondary | ICD-10-CM | POA: Diagnosis not present

## 2021-05-01 DIAGNOSIS — S41111D Laceration without foreign body of right upper arm, subsequent encounter: Secondary | ICD-10-CM | POA: Diagnosis not present

## 2021-05-01 DIAGNOSIS — D631 Anemia in chronic kidney disease: Secondary | ICD-10-CM | POA: Diagnosis not present

## 2021-05-01 DIAGNOSIS — I4821 Permanent atrial fibrillation: Secondary | ICD-10-CM | POA: Diagnosis not present

## 2021-05-02 DIAGNOSIS — Z0000 Encounter for general adult medical examination without abnormal findings: Secondary | ICD-10-CM | POA: Diagnosis not present

## 2021-05-02 DIAGNOSIS — N289 Disorder of kidney and ureter, unspecified: Secondary | ICD-10-CM | POA: Diagnosis not present

## 2021-05-02 DIAGNOSIS — I10 Essential (primary) hypertension: Secondary | ICD-10-CM | POA: Diagnosis not present

## 2021-05-02 DIAGNOSIS — F40298 Other specified phobia: Secondary | ICD-10-CM | POA: Diagnosis not present

## 2021-05-02 DIAGNOSIS — R21 Rash and other nonspecific skin eruption: Secondary | ICD-10-CM | POA: Diagnosis not present

## 2021-05-02 DIAGNOSIS — F320 Major depressive disorder, single episode, mild: Secondary | ICD-10-CM | POA: Diagnosis not present

## 2021-05-02 DIAGNOSIS — I350 Nonrheumatic aortic (valve) stenosis: Secondary | ICD-10-CM | POA: Diagnosis not present

## 2021-05-02 DIAGNOSIS — G2581 Restless legs syndrome: Secondary | ICD-10-CM | POA: Diagnosis not present

## 2021-05-02 DIAGNOSIS — Z79899 Other long term (current) drug therapy: Secondary | ICD-10-CM | POA: Diagnosis not present

## 2021-05-02 DIAGNOSIS — I5032 Chronic diastolic (congestive) heart failure: Secondary | ICD-10-CM | POA: Diagnosis not present

## 2021-05-02 DIAGNOSIS — D649 Anemia, unspecified: Secondary | ICD-10-CM | POA: Diagnosis not present

## 2021-05-02 DIAGNOSIS — I4821 Permanent atrial fibrillation: Secondary | ICD-10-CM | POA: Diagnosis not present

## 2021-05-02 DIAGNOSIS — E782 Mixed hyperlipidemia: Secondary | ICD-10-CM | POA: Diagnosis not present

## 2021-05-02 DIAGNOSIS — Z780 Asymptomatic menopausal state: Secondary | ICD-10-CM | POA: Diagnosis not present

## 2021-05-09 DIAGNOSIS — Z23 Encounter for immunization: Secondary | ICD-10-CM | POA: Diagnosis not present

## 2021-05-09 DIAGNOSIS — N289 Disorder of kidney and ureter, unspecified: Secondary | ICD-10-CM | POA: Diagnosis not present

## 2021-05-09 DIAGNOSIS — I10 Essential (primary) hypertension: Secondary | ICD-10-CM | POA: Diagnosis not present

## 2021-05-09 DIAGNOSIS — R7303 Prediabetes: Secondary | ICD-10-CM | POA: Diagnosis not present

## 2021-05-09 DIAGNOSIS — E611 Iron deficiency: Secondary | ICD-10-CM | POA: Diagnosis not present

## 2021-05-23 DIAGNOSIS — I4821 Permanent atrial fibrillation: Secondary | ICD-10-CM | POA: Diagnosis not present

## 2021-05-23 DIAGNOSIS — R5383 Other fatigue: Secondary | ICD-10-CM | POA: Diagnosis not present

## 2021-05-23 DIAGNOSIS — R21 Rash and other nonspecific skin eruption: Secondary | ICD-10-CM | POA: Diagnosis not present

## 2021-05-23 DIAGNOSIS — Z1329 Encounter for screening for other suspected endocrine disorder: Secondary | ICD-10-CM | POA: Diagnosis not present

## 2021-06-14 DIAGNOSIS — I350 Nonrheumatic aortic (valve) stenosis: Secondary | ICD-10-CM | POA: Diagnosis not present

## 2021-06-14 DIAGNOSIS — M810 Age-related osteoporosis without current pathological fracture: Secondary | ICD-10-CM | POA: Diagnosis not present

## 2021-06-14 DIAGNOSIS — R7303 Prediabetes: Secondary | ICD-10-CM | POA: Diagnosis not present

## 2021-06-14 DIAGNOSIS — Z780 Asymptomatic menopausal state: Secondary | ICD-10-CM | POA: Diagnosis not present

## 2021-06-14 DIAGNOSIS — I10 Essential (primary) hypertension: Secondary | ICD-10-CM | POA: Diagnosis not present

## 2021-06-14 DIAGNOSIS — G2581 Restless legs syndrome: Secondary | ICD-10-CM | POA: Diagnosis not present

## 2021-06-14 DIAGNOSIS — F40298 Other specified phobia: Secondary | ICD-10-CM | POA: Diagnosis not present

## 2021-06-14 DIAGNOSIS — Z79899 Other long term (current) drug therapy: Secondary | ICD-10-CM | POA: Diagnosis not present

## 2021-06-14 DIAGNOSIS — F0390 Unspecified dementia without behavioral disturbance: Secondary | ICD-10-CM | POA: Diagnosis not present

## 2021-06-14 DIAGNOSIS — I5032 Chronic diastolic (congestive) heart failure: Secondary | ICD-10-CM | POA: Diagnosis not present

## 2021-06-14 DIAGNOSIS — E611 Iron deficiency: Secondary | ICD-10-CM | POA: Diagnosis not present

## 2021-06-14 DIAGNOSIS — I4821 Permanent atrial fibrillation: Secondary | ICD-10-CM | POA: Diagnosis not present

## 2021-06-14 DIAGNOSIS — R21 Rash and other nonspecific skin eruption: Secondary | ICD-10-CM | POA: Diagnosis not present

## 2021-06-20 DIAGNOSIS — R300 Dysuria: Secondary | ICD-10-CM | POA: Diagnosis not present

## 2021-06-21 DIAGNOSIS — F0390 Unspecified dementia without behavioral disturbance: Secondary | ICD-10-CM | POA: Diagnosis not present

## 2021-06-21 DIAGNOSIS — R21 Rash and other nonspecific skin eruption: Secondary | ICD-10-CM | POA: Diagnosis not present

## 2021-06-28 DIAGNOSIS — G2581 Restless legs syndrome: Secondary | ICD-10-CM | POA: Diagnosis not present

## 2021-06-28 DIAGNOSIS — R21 Rash and other nonspecific skin eruption: Secondary | ICD-10-CM | POA: Diagnosis not present

## 2021-07-26 DIAGNOSIS — Z9841 Cataract extraction status, right eye: Secondary | ICD-10-CM | POA: Diagnosis not present

## 2021-07-26 DIAGNOSIS — H524 Presbyopia: Secondary | ICD-10-CM | POA: Diagnosis not present

## 2021-07-26 DIAGNOSIS — Z9842 Cataract extraction status, left eye: Secondary | ICD-10-CM | POA: Diagnosis not present

## 2021-07-26 DIAGNOSIS — H43813 Vitreous degeneration, bilateral: Secondary | ICD-10-CM | POA: Diagnosis not present

## 2021-07-26 DIAGNOSIS — H5203 Hypermetropia, bilateral: Secondary | ICD-10-CM | POA: Diagnosis not present

## 2021-07-26 DIAGNOSIS — H52223 Regular astigmatism, bilateral: Secondary | ICD-10-CM | POA: Diagnosis not present

## 2021-07-26 DIAGNOSIS — Z961 Presence of intraocular lens: Secondary | ICD-10-CM | POA: Diagnosis not present

## 2021-07-26 DIAGNOSIS — H401124 Primary open-angle glaucoma, left eye, indeterminate stage: Secondary | ICD-10-CM | POA: Diagnosis not present

## 2021-08-14 DIAGNOSIS — I4821 Permanent atrial fibrillation: Secondary | ICD-10-CM | POA: Diagnosis not present

## 2021-08-14 DIAGNOSIS — I10 Essential (primary) hypertension: Secondary | ICD-10-CM | POA: Diagnosis not present

## 2021-08-14 DIAGNOSIS — I5032 Chronic diastolic (congestive) heart failure: Secondary | ICD-10-CM | POA: Diagnosis not present

## 2021-08-14 DIAGNOSIS — I350 Nonrheumatic aortic (valve) stenosis: Secondary | ICD-10-CM | POA: Diagnosis not present

## 2021-08-24 DIAGNOSIS — I5032 Chronic diastolic (congestive) heart failure: Secondary | ICD-10-CM | POA: Diagnosis not present

## 2021-08-24 DIAGNOSIS — I4821 Permanent atrial fibrillation: Secondary | ICD-10-CM | POA: Diagnosis not present

## 2021-10-02 DIAGNOSIS — R1312 Dysphagia, oropharyngeal phase: Secondary | ICD-10-CM | POA: Diagnosis not present

## 2021-10-07 DIAGNOSIS — E785 Hyperlipidemia, unspecified: Secondary | ICD-10-CM | POA: Diagnosis not present

## 2021-10-07 DIAGNOSIS — I509 Heart failure, unspecified: Secondary | ICD-10-CM | POA: Diagnosis not present

## 2021-10-07 DIAGNOSIS — R0789 Other chest pain: Secondary | ICD-10-CM | POA: Diagnosis not present

## 2021-10-07 DIAGNOSIS — I110 Hypertensive heart disease with heart failure: Secondary | ICD-10-CM | POA: Diagnosis not present

## 2021-10-07 DIAGNOSIS — Z20822 Contact with and (suspected) exposure to covid-19: Secondary | ICD-10-CM | POA: Diagnosis not present

## 2021-10-07 DIAGNOSIS — R109 Unspecified abdominal pain: Secondary | ICD-10-CM | POA: Diagnosis not present

## 2021-10-07 DIAGNOSIS — Z7983 Long term (current) use of bisphosphonates: Secondary | ICD-10-CM | POA: Diagnosis not present

## 2021-10-07 DIAGNOSIS — D649 Anemia, unspecified: Secondary | ICD-10-CM | POA: Diagnosis not present

## 2021-10-07 DIAGNOSIS — Z7982 Long term (current) use of aspirin: Secondary | ICD-10-CM | POA: Diagnosis not present

## 2021-10-07 DIAGNOSIS — R9431 Abnormal electrocardiogram [ECG] [EKG]: Secondary | ICD-10-CM | POA: Diagnosis not present

## 2021-10-07 DIAGNOSIS — R1310 Dysphagia, unspecified: Secondary | ICD-10-CM | POA: Diagnosis not present

## 2021-10-08 ENCOUNTER — Encounter (INDEPENDENT_AMBULATORY_CARE_PROVIDER_SITE_OTHER): Payer: Self-pay | Admitting: *Deleted

## 2021-10-15 ENCOUNTER — Other Ambulatory Visit: Payer: Self-pay

## 2021-10-15 ENCOUNTER — Encounter (INDEPENDENT_AMBULATORY_CARE_PROVIDER_SITE_OTHER): Payer: Self-pay | Admitting: Gastroenterology

## 2021-10-15 ENCOUNTER — Ambulatory Visit (INDEPENDENT_AMBULATORY_CARE_PROVIDER_SITE_OTHER): Payer: Medicare Other | Admitting: Gastroenterology

## 2021-10-15 VITALS — BP 134/67 | HR 72 | Temp 97.9°F | Ht 59.0 in | Wt 99.2 lb

## 2021-10-15 DIAGNOSIS — R1310 Dysphagia, unspecified: Secondary | ICD-10-CM

## 2021-10-15 DIAGNOSIS — K209 Esophagitis, unspecified without bleeding: Secondary | ICD-10-CM | POA: Insufficient documentation

## 2021-10-15 DIAGNOSIS — R159 Full incontinence of feces: Secondary | ICD-10-CM | POA: Insufficient documentation

## 2021-10-15 MED ORDER — PANTOPRAZOLE SODIUM 40 MG PO TBEC: 40.0000 mg | DELAYED_RELEASE_TABLET | Freq: Every day | ORAL | 1 refills | Status: DC

## 2021-10-15 MED ORDER — SUCRALFATE 1 G PO TABS: 1.0000 g | ORAL_TABLET | Freq: Three times a day (TID) | ORAL | 0 refills | Status: DC

## 2021-10-15 NOTE — Patient Instructions (Signed)
It was very nice to meet you! I suspect you have esophagitis that was causing your difficulty swallowing and pain in your throat/chest, likely from the fosamax you are on. Please discuss other alternatives with your PCP, as there is an injection that be done in place of this. It is reassuring that your symptoms have improved. Please stop maalox/mylanta. I am giving you a medication called sucralafate/carafate, please dissolve 1 tablet in 1 oz of water and take before each meal and at bedtime I am also starting you on a low dose PPI medication called protonix, this is used for acid reflux, but can also help in the healing of your esophagus. I will see you back in about 1 month to see how symptoms are, if something worsens in the meantime, please let me know. We will hold off on doing any endoscopic procedures at this time, given your age/other health issues and increased risks from these.   Follow up 1 month

## 2021-10-15 NOTE — Progress Notes (Signed)
Referring Provider: Leeanne Rio, MD Primary Care Physician:  Leeanne Rio, MD Primary GI Physician: new  Chief Complaint  Patient presents with   Dysphagia    Pt arrives with daughter [name] [name]. Pt is a new patient. Referred for dysphagia. Went to ED in [facility] system) last week for chest pain and feeling like something was stuck in throat.    HPI:   [name] [name] is a 88 y.o. female with past medical history of A fib, not currently anticoagulated, anxiety, arthritis, carotid stenosis, GERD, HLD, HTN.   Patient presenting today as a new patient for dysphagia and odynophagia.  Patient arrives with her daughter for dysphagia x 2 weeks. Daughter states that patient had been having pain in her throat and chest with swallowing and was having issues with foods, pills and liquids not going down well. She states that initially she had pain with swallowing and felt that anything she swallowed would get stuck and cause her to cough. She denies any episodes of food impaction. She would occasionally have to cough food back up into a napkin initially. She was seen last week at a hospital in [place] and had a barium swallow study that was normal, had cardiac workup as well that was unremarkable. She was advised to follow up with GI for concerns of esophageal stricture. She has been using some maalox/mylanta which seems to have improved her symptoms greatly. She states she has been taking this prior to meals over the past 2 weeks. She denies any issues with heart burn or acid reflux. She denies any abdominal pain. She denise any issues with her appetite. No early satiety or post prandial pain. She denies any new medications, but does report that she takes her metoprolol, remeron and trazadone around dinner. Notably, she is on fosamax once a week, has been on this for about 1 year. She states since using the mylanta/maalox, symptoms have almost completely resolved. She is more aware when she swallows  as she reports she can feel the food/liquid passing more than before, but has no pain now. She is able to eat her regular diet and does not feel that foods,liquids or pills are having trouble passing now.   She does report that she also has some issues with fecal incontinence. She states that she is having some softer stools at times but denies diarrhea. Having only 1 BM per day. Reports this occurs maybe 2-3x/week. She denies any rectal bleeding or melena.   NSAID use:no NSAID Social hx:no  Fam hx: no CRC or liver disease  Past Medical History:  Diagnosis Date   Abnormal EKG    low voltage (no amyloid)   Anxiety    May play a role with some symptoms   Aortic valve sclerosis    Echo  04/2009  EF 65%.no stenosis.... echo... September, 2010   Arthritis    Atrial fibrillation [facility])    DCCV prior to 04/05/2009 but reverted A.Fib, consultation w/Dr.Klein, decision to use Propafenone and   cardiovert again..cardioversion June 08, 2009.[name] on 4 days Propafenone... 100 Joules... normal sinus rhythm... Cardizem stopped.. /  return to atrial fib.[name]   /   cardionet on cardizem...09/14/2009...rates  52-190.  On Coumadin   Back pain    February, 2013   Carotid stenosis    Dopplers 2009 better than past; Dopplers 09/07/2009 <50% bilateral-stable   Chest pain    cardiac catheterization.. 2009... no significant CAD.[name] / recurrent chest pain 2010.. consider; EF 65% by  Echo 04/2009   Drug therapy    coumadin   Dyspnea    Ejection fraction    GERD (gastroesophageal reflux disease)    Aspirin Stopped   H/O Spinal surgery    Successful,  2013   Hyperlipidemia    Hypertension    Shortness of breath    Shortness of breath and fatigue June, 2012   Visual changes    Prior planned to have ophthalmology evlaution    Past Surgical History:  Procedure Laterality Date   BACK SURGERY     X 2, most recent 2009   CATARACT EXTRACTION, BILATERAL      Current Outpatient Medications  Medication Sig Dispense  Refill   acetaminophen (TYLENOL) 500 MG tablet Take 500 mg by mouth. As needed     alendronate (FOSAMAX) 70 MG tablet Take 70 mg by mouth once a week. Take with a full glass of water on an empty stomach.     aspirin EC 81 MG tablet Take 81 mg by mouth daily. Swallow whole.     Calcium Carbonate-Vitamin D 600-400 MG-UNIT tablet Take 1 tablet by mouth 2 (two) times daily.       chlorhexidine (HIBICLENS) 4 % external liquid Apply topically. Apply topically daily as needed. Then wash off     cyanocobalamin 1000 MCG tablet Take 1,000 mcg by mouth daily.     digoxin (LANOXIN) 0.125 MG tablet Take by mouth. Take one tablet every other day     ferrous sulfate 325 (65 FE) MG tablet Take 325 mg by mouth. Take one every other day     furosemide (LASIX) 20 MG tablet TAKE 1 TABLET(20 MG) BY MOUTH DAILY (Patient taking differently: 20 mg. Take one every other day) 90 tablet 3   loratadine (CLARITIN) 10 MG tablet Take 10 mg by mouth daily as needed for allergies.     Melatonin 10 MG TABS Take by mouth. Takes as needed     metoprolol tartrate (LOPRESSOR) 25 MG tablet Take 25 mg by mouth 2 (two) times daily.     mirtazapine (REMERON) 15 MG tablet Take 15 mg by mouth. Take one half tablet by mouth nightly     spironolactone (ALDACTONE) 25 MG tablet Take 25 mg by mouth. Take one half daily     traZODone (DESYREL) 50 MG tablet Take 50 mg by mouth. Take one half qhs prn sleep     triamcinolone cream (KENALOG) 0.1 % Apply 1 application topically 2 (two) times daily.     No current facility-administered medications for this visit.    Allergies as of 10/15/2021   (No Known Allergies)    Family History  Problem Relation Age of Onset   Cancer Other    Coronary artery disease Other     Social History   Socioeconomic History   Marital status: Widowed    Spouse name: Not on file   Number of children: Not on file   Years of education: Not on file   Highest education level: Not on file  Occupational History    Not on file  Tobacco Use   Smoking status: Never   Smokeless tobacco: Never  Substance and Sexual Activity   Alcohol use: No    Alcohol/week: 0.0 standard drinks   Drug use: No   Sexual activity: Yes    Birth control/protection: Post-menopausal  Other Topics Concern   Not on file  Social History Narrative   Not on file   Social Determinants of Health  Financial Resource Strain: Not on file  Food Insecurity: Not on file  Transportation Needs: Not on file  Physical Activity: Not on file  Stress: Not on file  Social Connections: Not on file   Review of systems General: negative for malaise, night sweats, fever, chills, weight loss Neck: Negative for lumps, goiter, pain and significant neck swelling Resp: Negative for cough, wheezing, dyspnea at rest CV: Negative for leg swelling, palpitations, orthopnea +chest pain GI: denies melena, hematochezia, nausea, vomiting, diarrhea, constipation, early satiety or unintentional weight loss. +dysphagia +odynophagia +fecal urgency/incontinence MSK: Negative for joint pain or swelling, back pain, and muscle pain. Derm: Negative for itching or rash Psych: Denies depression, anxiety, memory loss, confusion. No homicidal or suicidal ideation.  Heme: Negative for prolonged bleeding, bruising easily, and swollen nodes. Endocrine: Negative for cold or heat intolerance, polyuria, polydipsia and goiter. Neuro: negative for tremor, gait imbalance, syncope and seizures. The remainder of the review of systems is noncontributory.  Physical Exam: BP 134/67 (BP Location: Right Arm, Patient Position: Sitting, Cuff Size: Normal)    Pulse 72    Temp 97.9 F (36.6 C) (Oral)    Ht 4\' 11"  (1.499 m)    Wt 99 lb 3.2 oz (45 kg)    BMI 20.04 kg/m  General:   Alert and oriented. No distress noted. Pleasant and cooperative.  Head:  Normocephalic and atraumatic. Eyes:  Conjuctiva clear without scleral icterus. Mouth:  Oral mucosa pink and moist. Good  dentition. No lesions. Heart: Normal rate and rhythm, s1 and s2 heart sounds present. Murmur present Lungs: Clear lung sounds in all lobes. Respirations equal and unlabored. Abdomen:  +BS, soft, non-tender and non-distended. No rebound or guarding. No HSM or masses noted. Derm: No palmar erythema or jaundice Msk:  Symmetrical without gross deformities. Normal posture. Extremities:  Without edema. Neurologic:  Alert and  oriented x4 Psych:  Alert and cooperative. Normal mood and affect.  Invalid input(s): 6 MONTHS   ASSESSMENT: VELERIA BARNHARDT is a 88 y.o. female presenting today for 2 weeks of dysphagia/odynophagia.  Odynophagia and dysphagia x2 weeks, reassuringly improved after taking maalox/mylanta before meals. She is no longer having pain with swallowing or difficulty with foods, liquids or pills passing. She has been on no new medications or antibiotics, but notably is on Fosamax, known to be an esophageal irritant, I suspect she is experiencing esophagitis secondary to her Fosamax. I will start her on a course of carafate 1g TID and before meals, as well as low dose PPI once daily. We discussed indications of endoscopic evaluation, however, given her age and other co-morbidities, risks likely outweigh the benefits. Symptoms are highly suggestive of pill esophagitis and reassuringly she has no alarm symptoms at this time. Weight is stable, she is able to eat without issue and has no early satiety. I will see her back in 1 month to ensure that she is continuing to improve. I did recommend she speak with PCP about other alternatives to fosamax such as an injection to avoid recurrence of her symptoms. All questions were answered, patient verbalized understanding and is in agreement with plan as outline above.   In regards to her fecal urgency/incontinence, I suspect this Is due to age related changes. She denies any rectal bleeding, melena, abdominal pain, weight loss or diarrhea. Can try  increasing dietary fiber or using supplement such as metamucil to help bulk up her stools some. She may benefit from biofeedback training in the future if symptoms worsen.  PLAN:  Talk to PCP about alternatives to fosamax 2. Carafate 1g TID and at bedtime, dissolve 1 tablet in 1oz of water and mix 3. Protonix 40mg  once daily 4. Patient to let me know if symptoms worsen. 5. Increase dietary fiber  Follow Up: 1 month  Shaina Gullatt L. Alver Sorrow, MSN, APRN, AGNP-C Adult-Gerontology Nurse Practitioner [facility]

## 2021-10-25 DIAGNOSIS — D692 Other nonthrombocytopenic purpura: Secondary | ICD-10-CM | POA: Diagnosis not present

## 2021-10-25 DIAGNOSIS — L111 Transient acantholytic dermatosis [Grover]: Secondary | ICD-10-CM | POA: Diagnosis not present

## 2021-10-25 DIAGNOSIS — L309 Dermatitis, unspecified: Secondary | ICD-10-CM | POA: Diagnosis not present

## 2021-10-25 DIAGNOSIS — D485 Neoplasm of uncertain behavior of skin: Secondary | ICD-10-CM | POA: Diagnosis not present

## 2021-11-12 ENCOUNTER — Telehealth (INDEPENDENT_AMBULATORY_CARE_PROVIDER_SITE_OTHER): Payer: Medicare Other | Admitting: Gastroenterology

## 2021-11-12 DIAGNOSIS — I5032 Chronic diastolic (congestive) heart failure: Secondary | ICD-10-CM | POA: Diagnosis not present

## 2021-11-12 DIAGNOSIS — I350 Nonrheumatic aortic (valve) stenosis: Secondary | ICD-10-CM | POA: Diagnosis not present

## 2021-11-12 DIAGNOSIS — I4821 Permanent atrial fibrillation: Secondary | ICD-10-CM | POA: Diagnosis not present

## 2021-11-12 DIAGNOSIS — I10 Essential (primary) hypertension: Secondary | ICD-10-CM | POA: Diagnosis not present

## 2021-11-20 DIAGNOSIS — L111 Transient acantholytic dermatosis [Grover]: Secondary | ICD-10-CM | POA: Diagnosis not present

## 2021-11-20 DIAGNOSIS — F320 Major depressive disorder, single episode, mild: Secondary | ICD-10-CM | POA: Diagnosis not present

## 2021-11-20 DIAGNOSIS — R1312 Dysphagia, oropharyngeal phase: Secondary | ICD-10-CM | POA: Diagnosis not present

## 2021-11-20 DIAGNOSIS — I4821 Permanent atrial fibrillation: Secondary | ICD-10-CM | POA: Diagnosis not present

## 2021-11-20 DIAGNOSIS — F0390 Unspecified dementia without behavioral disturbance: Secondary | ICD-10-CM | POA: Diagnosis not present

## 2021-11-20 DIAGNOSIS — I5032 Chronic diastolic (congestive) heart failure: Secondary | ICD-10-CM | POA: Diagnosis not present

## 2021-12-06 ENCOUNTER — Other Ambulatory Visit (INDEPENDENT_AMBULATORY_CARE_PROVIDER_SITE_OTHER): Payer: Self-pay | Admitting: Gastroenterology

## 2021-12-06 NOTE — Telephone Encounter (Signed)
Ov in February. No upcoming appt. Copied plan from ov note below:  ? ?PLAN:  ?Talk to PCP about alternatives to fosamax ?2. Carafate 1g TID and at bedtime, dissolve 1 tablet in 1oz of water and mix ?3. Protonix '40mg'$  once daily ?4. Patient to let me know if symptoms worsen. ?5. Increase dietary fiber ?  ?Follow Up: ?1 month ?

## 2021-12-16 ENCOUNTER — Other Ambulatory Visit (INDEPENDENT_AMBULATORY_CARE_PROVIDER_SITE_OTHER): Payer: Self-pay | Admitting: Gastroenterology

## 2021-12-18 ENCOUNTER — Ambulatory Visit (INDEPENDENT_AMBULATORY_CARE_PROVIDER_SITE_OTHER): Payer: Medicare Other | Admitting: Gastroenterology

## 2021-12-26 DIAGNOSIS — L111 Transient acantholytic dermatosis [Grover]: Secondary | ICD-10-CM | POA: Diagnosis not present

## 2021-12-26 DIAGNOSIS — L298 Other pruritus: Secondary | ICD-10-CM | POA: Diagnosis not present

## 2021-12-26 DIAGNOSIS — L820 Inflamed seborrheic keratosis: Secondary | ICD-10-CM | POA: Diagnosis not present

## 2021-12-27 DIAGNOSIS — R059 Cough, unspecified: Secondary | ICD-10-CM | POA: Diagnosis not present

## 2022-03-07 DIAGNOSIS — Z9181 History of falling: Secondary | ICD-10-CM | POA: Diagnosis not present

## 2022-03-07 DIAGNOSIS — I4891 Unspecified atrial fibrillation: Secondary | ICD-10-CM | POA: Diagnosis not present

## 2022-03-07 DIAGNOSIS — T148XXA Other injury of unspecified body region, initial encounter: Secondary | ICD-10-CM | POA: Diagnosis not present

## 2022-03-07 DIAGNOSIS — F0390 Unspecified dementia without behavioral disturbance: Secondary | ICD-10-CM | POA: Diagnosis not present

## 2022-03-07 DIAGNOSIS — R2681 Unsteadiness on feet: Secondary | ICD-10-CM | POA: Diagnosis not present

## 2022-03-07 DIAGNOSIS — I4821 Permanent atrial fibrillation: Secondary | ICD-10-CM | POA: Diagnosis not present

## 2022-03-07 DIAGNOSIS — R7302 Impaired glucose tolerance (oral): Secondary | ICD-10-CM | POA: Diagnosis not present

## 2022-03-07 DIAGNOSIS — Z0001 Encounter for general adult medical examination with abnormal findings: Secondary | ICD-10-CM | POA: Diagnosis not present

## 2022-04-18 DIAGNOSIS — H02889 Meibomian gland dysfunction of unspecified eye, unspecified eyelid: Secondary | ICD-10-CM | POA: Diagnosis not present

## 2022-04-18 DIAGNOSIS — H1045 Other chronic allergic conjunctivitis: Secondary | ICD-10-CM | POA: Diagnosis not present

## 2022-05-07 DIAGNOSIS — I4821 Permanent atrial fibrillation: Secondary | ICD-10-CM | POA: Diagnosis not present

## 2022-05-07 DIAGNOSIS — R5383 Other fatigue: Secondary | ICD-10-CM | POA: Diagnosis not present

## 2022-05-07 DIAGNOSIS — R0602 Shortness of breath: Secondary | ICD-10-CM | POA: Diagnosis not present

## 2022-05-07 DIAGNOSIS — R7302 Impaired glucose tolerance (oral): Secondary | ICD-10-CM | POA: Diagnosis not present

## 2022-05-10 DIAGNOSIS — I083 Combined rheumatic disorders of mitral, aortic and tricuspid valves: Secondary | ICD-10-CM | POA: Diagnosis not present

## 2022-05-10 DIAGNOSIS — I272 Pulmonary hypertension, unspecified: Secondary | ICD-10-CM | POA: Diagnosis not present

## 2022-05-10 DIAGNOSIS — R0602 Shortness of breath: Secondary | ICD-10-CM | POA: Diagnosis not present

## 2022-05-13 DIAGNOSIS — E782 Mixed hyperlipidemia: Secondary | ICD-10-CM | POA: Diagnosis not present

## 2022-05-13 DIAGNOSIS — I10 Essential (primary) hypertension: Secondary | ICD-10-CM | POA: Diagnosis not present

## 2022-05-13 DIAGNOSIS — I4821 Permanent atrial fibrillation: Secondary | ICD-10-CM | POA: Diagnosis not present

## 2022-05-13 DIAGNOSIS — I38 Endocarditis, valve unspecified: Secondary | ICD-10-CM | POA: Diagnosis not present

## 2022-07-02 DIAGNOSIS — L218 Other seborrheic dermatitis: Secondary | ICD-10-CM | POA: Diagnosis not present

## 2022-07-02 DIAGNOSIS — L111 Transient acantholytic dermatosis [Grover]: Secondary | ICD-10-CM | POA: Diagnosis not present

## 2022-07-02 DIAGNOSIS — D692 Other nonthrombocytopenic purpura: Secondary | ICD-10-CM | POA: Diagnosis not present

## 2022-07-02 DIAGNOSIS — L309 Dermatitis, unspecified: Secondary | ICD-10-CM | POA: Diagnosis not present

## 2023-05-20 ENCOUNTER — Encounter: Payer: Self-pay | Admitting: Cardiology

## 2023-06-26 ENCOUNTER — Ambulatory Visit: Payer: Medicare Other | Attending: Cardiology | Admitting: Cardiology

## 2023-06-26 ENCOUNTER — Encounter: Payer: Self-pay | Admitting: *Deleted

## 2023-06-26 ENCOUNTER — Encounter: Payer: Self-pay | Admitting: Cardiology

## 2023-06-26 VITALS — BP 134/58 | HR 60 | Ht 59.0 in | Wt 102.6 lb

## 2023-06-26 DIAGNOSIS — I350 Nonrheumatic aortic (valve) stenosis: Secondary | ICD-10-CM | POA: Diagnosis present

## 2023-06-26 DIAGNOSIS — I5032 Chronic diastolic (congestive) heart failure: Secondary | ICD-10-CM | POA: Diagnosis not present

## 2023-06-26 DIAGNOSIS — I4821 Permanent atrial fibrillation: Secondary | ICD-10-CM | POA: Insufficient documentation

## 2023-06-26 NOTE — Progress Notes (Signed)
Clinical Summary [name] [name] is a 89 y.o.female seen today for follow up of the following medical problems    1. Chronic HFpEF - no recent LE, mild SOB/DOE - taking lasix 20mg  daily.    2. Permanent Afib - had been on coumadin previously. She historically had not been interested in DOACs.  - over last few years had been followed by [facility] cardiology. Appears coumadin was stopped.   From 02/2021 discharge summary: "Discussion was held between cardiologist and patient regarding stopping her warfarin and adding a daily baby aspirin in its place as patient is subtherapeutic on her INR and the warfarin is not doing any good at this point and risk versus benefit discussed with her" Cards consult during that admission: "PCP has been managing Coumadin for [facility] but INR has been low for some time dating back to 12/01/20; both risks for CVA as well as bleeding are elevated and based on repeat discussion today and shared decision making, we will stop Coumadin and start daily aspirin"  - discussed with patient if willing to go on a DOAC, she will think over with her family.        3. Hyperlipidemia - compliant with meds - labs followed by pcp   4. HTN   5. History of CVA - most recnt 12/2022 admission to [facility] - MRI of the brain with acute nonhemorrhagic left frontal infarction   - was transientlty on hospice, now off.     6. Severe aortic stenosis - from [facility] notes she did not wish to pursue intervention -12/2022 echo [facility]: LVEF >65%. Mean grad 31, AVA VTI 0.59 DI   7. Severe mitral regurgitation/Severe tricuspid regurgitation - 12/2022 echo LVEF >65%, severe MR, severe TR  - was taking lasix 20mg  every other day, taking 20mg  daily.  - home weights 101 lbs   8. Severe pulmonary HTN - 12/2022 echo [facility] mod RV dysfunction, PASP 70-75   Past Medical History:  Diagnosis Date   Abnormal EKG    low voltage (no amyloid)   Anxiety    May play a role with some symptoms   Aortic  valve sclerosis    Echo  04/2009  EF 65%.no stenosis.... echo... September, 2010   Arthritis    Atrial fibrillation [facility])    DCCV prior to 04/05/2009 but reverted A.Fib, consultation w/Dr.Klein, decision to use Propafenone and   cardiovert again..cardioversion June 08, 2009.[name] on 4 days Propafenone... 100 Joules... normal sinus rhythm... Cardizem stopped.. /  return to atrial fib.[name]   /   cardionet on cardizem...09/14/2009...rates  52-190.  On Coumadin   Back pain    February, 2013   Carotid stenosis    Dopplers 2009 better than past; Dopplers 09/07/2009 <50% bilateral-stable   Chest pain    cardiac catheterization.. 2009... no significant CAD.[name] / recurrent chest pain 2010.. consider; EF 65% by Echo 04/2009   Drug therapy    coumadin   Dyspnea    Ejection fraction    GERD (gastroesophageal reflux disease)    Aspirin Stopped   H/O Spinal surgery    Successful,  2013   Hyperlipidemia    Hypertension    Shortness of breath    Shortness of breath and fatigue June, 2012   Visual changes    Prior planned to have ophthalmology evlaution     No Known Allergies   Current Outpatient Medications  Medication Sig Dispense Refill   acetaminophen (TYLENOL) 500 MG tablet Take 500 mg by  mouth. As needed     alendronate (FOSAMAX) 70 MG tablet Take 70 mg by mouth once a week. Take with a full glass of water on an empty stomach.     aspirin EC 81 MG tablet Take 81 mg by mouth daily. Swallow whole.     Calcium Carbonate-Vitamin D 600-400 MG-UNIT tablet Take 1 tablet by mouth 2 (two) times daily.       chlorhexidine (HIBICLENS) 4 % external liquid Apply topically. Apply topically daily as needed. Then wash off     cyanocobalamin 1000 MCG tablet Take 1,000 mcg by mouth daily.     digoxin (LANOXIN) 0.125 MG tablet Take by mouth. Take one tablet every other day     ferrous sulfate 325 (65 FE) MG tablet Take 325 mg by mouth. Take one every other day     furosemide (LASIX) 20 MG tablet TAKE 1 TABLET(20 MG)  BY MOUTH DAILY (Patient taking differently: 20 mg. Take one every other day) 90 tablet 3   loratadine (CLARITIN) 10 MG tablet Take 10 mg by mouth daily as needed for allergies.     Melatonin 10 MG TABS Take by mouth. Takes as needed     metoprolol tartrate (LOPRESSOR) 25 MG tablet Take 25 mg by mouth 2 (two) times daily.     mirtazapine (REMERON) 15 MG tablet Take 15 mg by mouth. Take one half tablet by mouth nightly     pantoprazole (PROTONIX) 40 MG tablet TAKE 1 TABLET BY MOUTH DAILY BEFORE BREAKFAST 60 tablet 1   spironolactone (ALDACTONE) 25 MG tablet Take 25 mg by mouth. Take one half daily     sucralfate (CARAFATE) 1 g tablet TAKE 1 TABLET (1 G TOTAL) BY MOUTH 4 TIMES A DAY WITH MEALS AND AT BEDTIME 120 tablet 0   traZODone (DESYREL) 50 MG tablet Take 50 mg by mouth. Take one half qhs prn sleep     triamcinolone cream (KENALOG) 0.1 % Apply 1 application topically 2 (two) times daily.     No current facility-administered medications for this visit.     Past Surgical History:  Procedure Laterality Date   BACK SURGERY     X 2, most recent 2009   CATARACT EXTRACTION, BILATERAL       No Known Allergies    Family History  Problem Relation Age of Onset   Cancer Other    Coronary artery disease Other      Social History [name] [name] reports that she has never smoked. She has never used smokeless tobacco. [name] [name] reports no history of alcohol use.   Review of Systems CONSTITUTIONAL: No weight loss, fever, chills, weakness or fatigue.  HEENT: Eyes: No visual loss, blurred vision, double vision or yellow sclerae.No hearing loss, sneezing, congestion, runny nose or sore throat.  SKIN: No rash or itching.  CARDIOVASCULAR: per hpi RESPIRATORY: per hpi GASTROINTESTINAL: No anorexia, nausea, vomiting or diarrhea. No abdominal pain or blood.  GENITOURINARY: No burning on urination, no polyuria NEUROLOGICAL: No headache, dizziness, syncope, paralysis, ataxia, numbness or  tingling in the extremities. No change in bowel or bladder control.  MUSCULOSKELETAL: No muscle, back pain, joint pain or stiffness.  LYMPHATICS: No enlarged nodes. No history of splenectomy.  PSYCHIATRIC: No history of depression or anxiety.  ENDOCRINOLOGIC: No reports of sweating, cold or heat intolerance. No polyuria or polydipsia.  [name]   Physical Examination Today's Vitals   06/26/23 1339  BP: (!) 134/58  Pulse: 60  SpO2: 98%  Weight: 102 lb  9.6 oz (46.5 kg)  Height: 4\' 11"  (1.499 m)   Body mass index is 20.72 kg/m.  Gen: resting comfortably, no acute distress HEENT: no scleral icterus, pupils equal round and reactive, no palptable cervical adenopathy,  CV: RRR, 3/6 systolic murmur rusb, no jvd Resp: Clear to auscultation bilaterally GI: abdomen is soft, non-tender, non-distended, normal bowel sounds, no hepatosplenomegaly MSK: extremities are warm, no edema.  Skin: warm, no rash Neuro:  no focal deficits Psych: appropriate affect   Diagnostic Studies  12/2022 echo [facility]  Left ventricle: The cavity size is normal. Wall thickness is mildly increased. Systolic function is normal. The estimated ejection fraction is >65%. Wall motion is normal; there are no regional wall motion abnormalities.  2.  Aortic valve: Cusp separation is severely reduced. Severe aortic stenosis is noted. The mean systolic gradient is 31 mm Hg. The peak systolic gradient is 56 mm Hg. The valve area is 0.59 cm?.  3.  Mitral valve: Moderate mitral annular calcification is noted. There is severe regurgitation.  4.  Right ventricle: The cavity size is moderately increased. Systolic function is moderately reduced.  5.  Pulmonary arteries: Systolic pressure is 70-75 mm Hg.  6.  Tricuspid valve: There is severe regurgitation. Severe pulmonary hypertension is present.  7.  Right atrium: The atrium is severely dilated.  8.  Since prior echo report [facility] 10/2022, the RV findings are new.  9.  These echo findings  confer a poor prognosis in a population of 87 yo patients and Dr Benjaman Kindler spoke with the ER provider.    Assessment and Plan   1. Chronic HFpEF - euvolemic, continue current meds  2. Permanent afib - no symptoms - unclear to me the decision to stop anticoagulation back in 2022 as documented above. I agree coumadin may not be the best option for her, she had previously been resistant to DOACs  - dicussed DOACs today, she will let us know if willing to start  3. Severe aortic stenosis - long standing severe AS, advanced age and multi valvular dysfunction with severe MR and TR as well poor candidate for intervention. When followed by [facility] cards had transiently been under hospice but has done better than anyone expected - manage volume status and symptoms with diuretic  Antoine Poche, M.D.

## 2023-06-26 NOTE — Patient Instructions (Addendum)
Medication Instructions:  Continue all current medications.  Labwork: none  Testing/Procedures: none  Follow-Up: 2 months   Any Other Special Instructions Will Be Listed Below (If Applicable).  If you need a refill on your cardiac medications before your next appointment, please call your pharmacy.  

## 2023-09-19 ENCOUNTER — Ambulatory Visit: Payer: Medicare Other | Attending: Cardiology | Admitting: Cardiology

## 2023-09-19 ENCOUNTER — Encounter: Payer: Self-pay | Admitting: Cardiology

## 2023-09-19 VITALS — BP 112/58 | HR 58 | Ht 59.0 in | Wt 102.2 lb

## 2023-09-19 DIAGNOSIS — Z79899 Other long term (current) drug therapy: Secondary | ICD-10-CM | POA: Diagnosis present

## 2023-09-19 DIAGNOSIS — E785 Hyperlipidemia, unspecified: Secondary | ICD-10-CM | POA: Diagnosis present

## 2023-09-19 DIAGNOSIS — R7309 Other abnormal glucose: Secondary | ICD-10-CM | POA: Diagnosis present

## 2023-09-19 DIAGNOSIS — I4821 Permanent atrial fibrillation: Secondary | ICD-10-CM | POA: Insufficient documentation

## 2023-09-19 DIAGNOSIS — I350 Nonrheumatic aortic (valve) stenosis: Secondary | ICD-10-CM | POA: Diagnosis not present

## 2023-09-19 DIAGNOSIS — Z5181 Encounter for therapeutic drug level monitoring: Secondary | ICD-10-CM | POA: Diagnosis present

## 2023-09-19 DIAGNOSIS — I10 Essential (primary) hypertension: Secondary | ICD-10-CM | POA: Diagnosis present

## 2023-09-19 DIAGNOSIS — I5032 Chronic diastolic (congestive) heart failure: Secondary | ICD-10-CM | POA: Insufficient documentation

## 2023-09-19 NOTE — Addendum Note (Signed)
Addended by: Lesle Chris on: 09/19/2023 02:14 PM   Modules accepted: Orders

## 2023-09-19 NOTE — Patient Instructions (Addendum)
Medication Instructions:   Continue all current medications.   Labwork:  FLP, CMET, Mg, CBC, TSH, Digoxin Level, HgbA1c - orders given today Reminder:  Nothing to eat or drink after 12 midnight prior to labs. Labs need to be drawn prior to morning dose on the Digoxin level Office will contact with results via phone, letter or mychart.     Testing/Procedures:  none  Follow-Up:  6 months   Any Other Special Instructions Will Be Listed Below (If Applicable).   If you need a refill on your cardiac medications before your next appointment, please call your pharmacy.

## 2023-09-19 NOTE — Progress Notes (Signed)
Clinical Summary [name] [name] is a 89 y.o.female seen today for follow up of the following medical problems       1. Chronic HFpEF - no recent edema. Chronic stable SOB - taking lasix 20mg  daily.    2. Permanent Afib - had been on coumadin previously. She historically had not been interested in DOACs.  - over last few years had been followed by [facility] cardiology. Appears coumadin was stopped.    From 02/2021 discharge summary: "Discussion was held between cardiologist and patient regarding stopping her warfarin and adding a daily baby aspirin in its place as patient is subtherapeutic on her INR and the warfarin is not doing any good at this point and risk versus benefit discussed with her" Cards consult during that admission: "PCP has been managing Coumadin for [facility] but INR has been low for some time dating back to 12/01/20; both risks for CVA as well as bleeding are elevated and based on repeat discussion today and shared decision making, we will stop Coumadin and start daily aspirin"    - no recent palpitations - remains not interested in DOACs.        3. Hyperlipidemia - compliant with meds - labs followed by pcp   12/2022 TC 169 TG 101 HDL 64 LDL 85 - has not been on cholesterol medication.    4. HTN - compliant with meds   5. History of CVA - most recnt 12/2022 admission to [facility] - MRI of the brain with acute nonhemorrhagic left frontal infarction    - was transientlty on hospice, now off.   - appears has not been on statin     6. Severe aortic stenosis - from [facility] notes she did not wish to pursue intervention -12/2022 echo [facility]: LVEF >65%. Mean grad 31, AVA VTI 0.59 DI    7. Severe mitral regurgitation/Severe tricuspid regurgitation - 12/2022 echo LVEF >65%, severe MR, severe TR   - was taking lasix 20mg  every other day, taking 20mg  daily.  - home weights 101 lbs     8. Severe pulmonary HTN - 12/2022 echo [facility] mod RV dysfunction, PASP 70-75 Past Medical  History:  Diagnosis Date   Abnormal EKG    low voltage (no amyloid)   Anxiety    May play a role with some symptoms   Aortic valve sclerosis    Echo  04/2009  EF 65%.no stenosis.... echo... September, 2010   Arthritis    Atrial fibrillation [facility])    DCCV prior to 04/05/2009 but reverted A.Fib, consultation w/Dr.Klein, decision to use Propafenone and   cardiovert again..cardioversion June 08, 2009.[name] on 4 days Propafenone... 100 Joules... normal sinus rhythm... Cardizem stopped.. /  return to atrial fib.[name]   /   cardionet on cardizem...09/14/2009...rates  52-190.  On Coumadin   Back pain    February, 2013   Carotid stenosis    Dopplers 2009 better than past; Dopplers 09/07/2009 <50% bilateral-stable   Chest pain    cardiac catheterization.. 2009... no significant CAD.[name] / recurrent chest pain 2010.. consider; EF 65% by Echo 04/2009   Drug therapy    coumadin   Dyspnea    Ejection fraction    GERD (gastroesophageal reflux disease)    Aspirin Stopped   H/O Spinal surgery    Successful,  2013   Hyperlipidemia    Hypertension    Shortness of breath    Shortness of breath and fatigue June, 2012   Visual changes  Prior planned to have ophthalmology evlaution     No Known Allergies   Current Outpatient Medications  Medication Sig Dispense Refill   acetaminophen (TYLENOL) 500 MG tablet Take 500 mg by mouth. As needed     aspirin EC 81 MG tablet Take 81 mg by mouth daily. Swallow whole.     Calcium Carbonate-Vitamin D 600-400 MG-UNIT tablet Take 1 tablet by mouth 2 (two) times daily.       digoxin (LANOXIN) 0.125 MG tablet Take 0.125 mg by mouth every other day.     fexofenadine (ALLEGRA) 180 MG tablet Take 180 mg by mouth at bedtime.     furosemide (LASIX) 20 MG tablet Take 20 mg by mouth daily.     LORazepam (ATIVAN) 0.5 MG tablet Take 0.5 mg by mouth 2 (two) times daily.     metoprolol tartrate (LOPRESSOR) 25 MG tablet Take 25 mg by mouth 2 (two) times daily.     spironolactone  (ALDACTONE) 25 MG tablet Take 12.5 mg by mouth daily. Take one half daily     traZODone (DESYREL) 50 MG tablet Take 50 mg by mouth at bedtime.     triamcinolone cream (KENALOG) 0.1 % Apply 1 application topically 2 (two) times daily.     No current facility-administered medications for this visit.     Past Surgical History:  Procedure Laterality Date   BACK SURGERY     X 2, most recent 2009   CATARACT EXTRACTION, BILATERAL       No Known Allergies    Family History  Problem Relation Age of Onset   Cancer Other    Coronary artery disease Other      Social History Ms. Housey reports that she has never smoked. She has never used smokeless tobacco. Ms. Lindahl reports no history of alcohol use.    Physical Examination Today's Vitals   09/19/23 1319  BP: (!) 112/58  Pulse: (!) 58  SpO2: 92%  Weight: 102 lb 3.2 oz (46.4 kg)  Height: 4\' 11"  (1.499 m)   Body mass index is 20.64 kg/m.  Gen: resting comfortably, no acute distress HEENT: no scleral icterus, pupils equal round and reactive, no palptable cervical adenopathy,  CV: RRR, no mrg, no jvd Resp: Clear to auscultation bilaterally GI: abdomen is soft, non-tender, non-distended, normal bowel sounds, no hepatosplenomegaly MSK: extremities are warm, no edema.  Skin: warm, no rash Neuro:  no focal deficits Psych: appropriate affect   Diagnostic Studies  12/2022 echo [facility]  Left ventricle: The cavity size is normal. Wall thickness is mildly increased. Systolic function is normal. The estimated ejection fraction is >65%. Wall motion is normal; there are no regional wall motion abnormalities.  2.  Aortic valve: Cusp separation is severely reduced. Severe aortic stenosis is noted. The mean systolic gradient is 31 mm Hg. The peak systolic gradient is 56 mm Hg. The valve area is 0.59 cm?.  3.  Mitral valve: Moderate mitral annular calcification is noted. There is severe regurgitation.  4.  Right ventricle: The cavity size  is moderately increased. Systolic function is moderately reduced.  5.  Pulmonary arteries: Systolic pressure is 70-75 mm Hg.  6.  Tricuspid valve: There is severe regurgitation. Severe pulmonary hypertension is present.  7.  Right atrium: The atrium is severely dilated.  8.  Since prior echo report [facility] 10/2022, the RV findings are new.  9.  These echo findings confer a poor prognosis in a population of 89 yo patients and Dr Benjaman Kindler spoke  with the ER provider.      Assessment and Plan   1. Chronic HFpEF - she is euvolemic today, continue current meds   2. Permanent afib - no symptoms - unclear to me the decision to stop anticoagulation back in 2022 as documented above. I agree coumadin may not be the best option for her, she had previously been resistant to DOACs  - dicussed DOACs today, patient prefers not to be on anticoag at this time - continue current meds, check digoxin elvel   3. Severe aortic stenosis - long standing severe AS, advanced age and multi valvular dysfunction with severe MR and TR as well poor candidate for intervention. When followed by [facility] cards had transiently been under hospice but has done better than anyone expected -not an intervention canddiate given advanced age, multi valvular dysfunction with severe MR and TR as well -continue to manage volume and symptoms with oral lasix - no plans for repeat imaging, would not change management   F/u 6 months     Antoine Poche, M.D.

## 2023-09-23 ENCOUNTER — Telehealth: Payer: Self-pay | Admitting: *Deleted

## 2023-09-23 NOTE — Telephone Encounter (Signed)
-----   Message from Dina Rich sent at 09/23/2023  1:17 PM EST ----- Her labs all look fine  Dominga Ferry MD

## 2023-09-23 NOTE — Telephone Encounter (Signed)
Notified daughter Lura Em), copy to pcp.

## 2023-09-25 ENCOUNTER — Telehealth: Payer: Self-pay | Admitting: Cardiology

## 2023-09-25 NOTE — Telephone Encounter (Signed)
Caller Catering manager - PCP Office) stated they have not received a copy of patient's lab results drawn on 1/27 and wants results re-faxed to fax# [phone], Attn:  Amber.
